# Patient Record
Sex: Female | Born: 1955
Health system: Southern US, Community
[De-identification: ages and names within clinical notes are randomized; demographics above are authoritative.]

## PROBLEM LIST (undated history)

## (undated) DIAGNOSIS — J45909 Unspecified asthma, uncomplicated: Secondary | ICD-10-CM

## (undated) DIAGNOSIS — F419 Anxiety disorder, unspecified: Secondary | ICD-10-CM

## (undated) DIAGNOSIS — F32A Depression, unspecified: Secondary | ICD-10-CM

## (undated) DIAGNOSIS — K219 Gastro-esophageal reflux disease without esophagitis: Secondary | ICD-10-CM

## (undated) DIAGNOSIS — F329 Major depressive disorder, single episode, unspecified: Secondary | ICD-10-CM

## (undated) DIAGNOSIS — E119 Type 2 diabetes mellitus without complications: Secondary | ICD-10-CM

## (undated) HISTORY — DX: Unspecified asthma, uncomplicated: J45.909

## (undated) HISTORY — DX: Major depressive disorder, single episode, unspecified: F32.9

## (undated) HISTORY — DX: Anxiety disorder, unspecified: F41.9

## (undated) HISTORY — PX: BILATERAL CARPAL TUNNEL RELEASE: SHX6508

## (undated) HISTORY — DX: Gastro-esophageal reflux disease without esophagitis: K21.9

## (undated) HISTORY — DX: Depression, unspecified: F32.A

## (undated) HISTORY — PX: ABDOMINAL HYSTERECTOMY: SHX81

## (undated) HISTORY — PX: OTHER SURGICAL HISTORY: SHX169

---

## 1997-04-23 ENCOUNTER — Ambulatory Visit (HOSPITAL_COMMUNITY): Admission: RE | Admit: 1997-04-23 | Discharge: 1997-04-23 | Payer: Self-pay | Admitting: Obstetrics & Gynecology

## 1998-04-26 ENCOUNTER — Encounter: Payer: Self-pay | Admitting: Obstetrics & Gynecology

## 1998-04-26 ENCOUNTER — Other Ambulatory Visit: Admission: RE | Admit: 1998-04-26 | Discharge: 1998-04-26 | Payer: Self-pay | Admitting: Obstetrics & Gynecology

## 1998-04-26 ENCOUNTER — Ambulatory Visit (HOSPITAL_COMMUNITY): Admission: RE | Admit: 1998-04-26 | Discharge: 1998-04-26 | Payer: Self-pay | Admitting: Obstetrics & Gynecology

## 1998-04-28 ENCOUNTER — Encounter: Payer: Self-pay | Admitting: Obstetrics & Gynecology

## 1998-04-28 ENCOUNTER — Ambulatory Visit (HOSPITAL_COMMUNITY): Admission: RE | Admit: 1998-04-28 | Discharge: 1998-04-28 | Payer: Self-pay | Admitting: Obstetrics & Gynecology

## 1998-10-31 ENCOUNTER — Ambulatory Visit (HOSPITAL_COMMUNITY): Admission: RE | Admit: 1998-10-31 | Discharge: 1998-10-31 | Payer: Self-pay | Admitting: Obstetrics & Gynecology

## 1998-10-31 ENCOUNTER — Encounter: Payer: Self-pay | Admitting: Obstetrics & Gynecology

## 1999-06-09 ENCOUNTER — Other Ambulatory Visit: Admission: RE | Admit: 1999-06-09 | Discharge: 1999-06-09 | Payer: Self-pay | Admitting: Obstetrics & Gynecology

## 1999-11-09 ENCOUNTER — Ambulatory Visit (HOSPITAL_COMMUNITY): Admission: RE | Admit: 1999-11-09 | Discharge: 1999-11-09 | Payer: Self-pay | Admitting: Obstetrics & Gynecology

## 1999-11-09 ENCOUNTER — Encounter: Payer: Self-pay | Admitting: Obstetrics & Gynecology

## 2000-09-17 ENCOUNTER — Encounter: Payer: Self-pay | Admitting: Obstetrics & Gynecology

## 2000-09-17 ENCOUNTER — Other Ambulatory Visit: Admission: RE | Admit: 2000-09-17 | Discharge: 2000-09-17 | Payer: Self-pay | Admitting: Obstetrics & Gynecology

## 2000-09-17 ENCOUNTER — Ambulatory Visit (HOSPITAL_COMMUNITY): Admission: RE | Admit: 2000-09-17 | Discharge: 2000-09-17 | Payer: Self-pay | Admitting: Obstetrics & Gynecology

## 2000-09-25 ENCOUNTER — Encounter: Admission: RE | Admit: 2000-09-25 | Discharge: 2000-09-25 | Payer: Self-pay | Admitting: Obstetrics & Gynecology

## 2000-09-25 ENCOUNTER — Encounter: Payer: Self-pay | Admitting: Obstetrics & Gynecology

## 2001-09-10 ENCOUNTER — Encounter: Payer: Self-pay | Admitting: Obstetrics & Gynecology

## 2001-09-10 ENCOUNTER — Ambulatory Visit (HOSPITAL_COMMUNITY): Admission: RE | Admit: 2001-09-10 | Discharge: 2001-09-10 | Payer: Self-pay | Admitting: Obstetrics & Gynecology

## 2001-09-10 ENCOUNTER — Other Ambulatory Visit: Admission: RE | Admit: 2001-09-10 | Discharge: 2001-09-10 | Payer: Self-pay | Admitting: Obstetrics & Gynecology

## 2001-11-06 ENCOUNTER — Observation Stay (HOSPITAL_COMMUNITY): Admission: RE | Admit: 2001-11-06 | Discharge: 2001-11-07 | Payer: Self-pay | Admitting: Obstetrics & Gynecology

## 2001-11-06 ENCOUNTER — Encounter (INDEPENDENT_AMBULATORY_CARE_PROVIDER_SITE_OTHER): Payer: Self-pay

## 2002-10-30 ENCOUNTER — Ambulatory Visit (HOSPITAL_COMMUNITY): Admission: RE | Admit: 2002-10-30 | Discharge: 2002-10-30 | Payer: Self-pay | Admitting: Obstetrics & Gynecology

## 2002-10-30 ENCOUNTER — Other Ambulatory Visit: Admission: RE | Admit: 2002-10-30 | Discharge: 2002-10-30 | Payer: Self-pay | Admitting: Obstetrics & Gynecology

## 2002-10-30 ENCOUNTER — Encounter: Payer: Self-pay | Admitting: Obstetrics & Gynecology

## 2003-12-17 ENCOUNTER — Other Ambulatory Visit: Admission: RE | Admit: 2003-12-17 | Discharge: 2003-12-17 | Payer: Self-pay | Admitting: Obstetrics & Gynecology

## 2003-12-17 ENCOUNTER — Ambulatory Visit (HOSPITAL_COMMUNITY): Admission: RE | Admit: 2003-12-17 | Discharge: 2003-12-17 | Payer: Self-pay | Admitting: Obstetrics & Gynecology

## 2005-01-02 ENCOUNTER — Ambulatory Visit (HOSPITAL_COMMUNITY): Admission: RE | Admit: 2005-01-02 | Discharge: 2005-01-02 | Payer: Self-pay | Admitting: Obstetrics & Gynecology

## 2005-01-02 ENCOUNTER — Other Ambulatory Visit: Admission: RE | Admit: 2005-01-02 | Discharge: 2005-01-02 | Payer: Self-pay | Admitting: Obstetrics & Gynecology

## 2006-01-04 ENCOUNTER — Ambulatory Visit (HOSPITAL_COMMUNITY): Admission: RE | Admit: 2006-01-04 | Discharge: 2006-01-04 | Payer: Self-pay | Admitting: Obstetrics & Gynecology

## 2007-01-08 ENCOUNTER — Ambulatory Visit (HOSPITAL_COMMUNITY): Admission: RE | Admit: 2007-01-08 | Discharge: 2007-01-08 | Payer: Self-pay | Admitting: Obstetrics & Gynecology

## 2007-12-26 ENCOUNTER — Encounter: Admission: RE | Admit: 2007-12-26 | Discharge: 2007-12-26 | Payer: Self-pay | Admitting: Neurology

## 2008-01-13 ENCOUNTER — Ambulatory Visit (HOSPITAL_COMMUNITY): Admission: RE | Admit: 2008-01-13 | Discharge: 2008-01-13 | Payer: Self-pay | Admitting: Obstetrics & Gynecology

## 2008-03-19 ENCOUNTER — Ambulatory Visit (HOSPITAL_BASED_OUTPATIENT_CLINIC_OR_DEPARTMENT_OTHER): Admission: RE | Admit: 2008-03-19 | Discharge: 2008-03-19 | Payer: Self-pay | Admitting: Orthopedic Surgery

## 2008-04-16 ENCOUNTER — Ambulatory Visit (HOSPITAL_BASED_OUTPATIENT_CLINIC_OR_DEPARTMENT_OTHER): Admission: RE | Admit: 2008-04-16 | Discharge: 2008-04-16 | Payer: Self-pay | Admitting: Orthopedic Surgery

## 2008-11-09 ENCOUNTER — Encounter: Admission: RE | Admit: 2008-11-09 | Discharge: 2009-02-07 | Payer: Self-pay | Admitting: Orthopedic Surgery

## 2009-01-14 ENCOUNTER — Ambulatory Visit (HOSPITAL_COMMUNITY): Admission: RE | Admit: 2009-01-14 | Discharge: 2009-01-14 | Payer: Self-pay | Admitting: Obstetrics & Gynecology

## 2009-07-08 ENCOUNTER — Ambulatory Visit (HOSPITAL_COMMUNITY): Admission: RE | Admit: 2009-07-08 | Discharge: 2009-07-08 | Payer: Self-pay | Admitting: Urology

## 2009-10-14 ENCOUNTER — Encounter: Payer: Self-pay | Admitting: Internal Medicine

## 2009-11-01 ENCOUNTER — Encounter: Payer: Self-pay | Admitting: Internal Medicine

## 2009-11-08 ENCOUNTER — Encounter: Payer: Self-pay | Admitting: Internal Medicine

## 2009-11-16 ENCOUNTER — Encounter (INDEPENDENT_AMBULATORY_CARE_PROVIDER_SITE_OTHER): Payer: Self-pay | Admitting: *Deleted

## 2009-12-01 ENCOUNTER — Encounter: Payer: Self-pay | Admitting: Internal Medicine

## 2009-12-14 ENCOUNTER — Encounter: Payer: Self-pay | Admitting: Internal Medicine

## 2009-12-23 ENCOUNTER — Encounter: Payer: Self-pay | Admitting: Internal Medicine

## 2009-12-29 ENCOUNTER — Ambulatory Visit: Payer: Self-pay | Admitting: Internal Medicine

## 2010-01-17 ENCOUNTER — Ambulatory Visit (HOSPITAL_COMMUNITY): Admission: RE | Admit: 2010-01-17 | Discharge: 2010-01-17 | Payer: Self-pay | Admitting: Obstetrics & Gynecology

## 2010-04-04 NOTE — Letter (Signed)
Summary: [facility]  [facility]   Imported By: Sherian Rein 01/14/2010 09:28:48  _____________________________________________________________________  External Attachment:    Type:   Image     Comment:   External Document

## 2010-04-04 NOTE — Letter (Signed)
Summary: [facility]  [facility]   Imported By: Sherian Rein 01/14/2010 09:26:36  _____________________________________________________________________  External Attachment:    Type:   Image     Comment:   External Document

## 2010-04-04 NOTE — Letter (Signed)
Summary: Patient's Hx/[facility]  Patient's Hx/[facility]   Imported By: Sherian Rein 01/14/2010 09:30:08  _____________________________________________________________________  External Attachment:    Type:   Image     Comment:   External Document

## 2010-04-04 NOTE — Letter (Signed)
Summary: [facility]  [facility]   Imported By: Sherian Rein 01/14/2010 09:27:53  _____________________________________________________________________  External Attachment:    Type:   Image     Comment:   External Document

## 2010-04-04 NOTE — Letter (Signed)
Summary: New Patient letter  [facility]  [address] [place], [place] [address]   Phone: [phone]  Fax: [phone]       11/16/2009 MRN: [id number]  Andrea Pittman [address] [place], [place]  [address]  Dear Ms. Andrea Pittman,  Welcome to the Gastroenterology Division at [facility].    You are scheduled to see Dr.  Leone Payor on 12-29-09 at 2:45p.m. on the 3rd floor at [facility], [address]  We ask that you try to arrive at our office 15 minutes prior to your appointment time to allow for check-in.  We would like you to complete the enclosed self-administered evaluation form prior to your visit and bring it with you on the day of your appointment.  We will review it with you.  Also, please bring a complete list of all your medications or, if you prefer, bring the medication bottles and we will list them.  Please bring your insurance card so that we may make a copy of it.  If your insurance requires a referral to see a specialist, please bring your referral form from your primary care physician.  Co-payments are due at the time of your visit and may be paid by cash, check or credit card.     Your office visit will consist of a consult with your physician (includes a physical exam), any laboratory testing he/she may order, scheduling of any necessary diagnostic testing (e.g. x-ray, ultrasound, CT-scan), and scheduling of a procedure (e.g. Endoscopy, Colonoscopy) if required.  Please allow enough time on your schedule to allow for any/all of these possibilities.    If you cannot keep your appointment, please call [phone] to cancel or reschedule prior to your appointment date.  This allows [place] the opportunity to schedule an appointment for another patient in need of care.  If you do not cancel or reschedule by 5 p.m. the business day prior to your appointment date, you will be charged a $50.00 late cancellation/no-show fee.    Thank you for choosing  [facility] for your medical needs.  We appreciate the opportunity to care for you.  Please visit [place] at our website  to learn more about our practice.                     Sincerely,                                                             The Gastroenterology Division

## 2010-04-04 NOTE — Assessment & Plan Note (Signed)
Summary: increased ferritin level--ch.   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 Neurologist had raised possibility of hemochromatosis. Ferrtin is 200. Iron sat 37%.   GI Review of Systems    Reports acid reflux and  heartburn.      Denies abdominal pain, belching, bloating, chest pain, dysphagia with liquids, dysphagia with solids, loss of appetite, nausea, vomiting, vomiting blood, weight loss, and  weight gain.        Denies anal fissure, black tarry stools, change in bowel habit, constipation, diarrhea, diverticulosis, fecal incontinence, heme positive stool, hemorrhoids, irritable bowel syndrome, jaundice, light color stool, liver problems, rectal bleeding, and  rectal pain.    Current Medications (verified): 1)  Gabapentin 300 Mg Caps (Gabapentin) .... 3 Tablets By Mouth in The Morning and Three Tablets By Mouth At Bedtime 2)  Estradiol 2 Mg Tabs (Estradiol) .... One Tablet By Mouth Once Daily 3)  Omeprazole 20 Mg Cpdr (Omeprazole) .... One Tablet By Mouth Once Daily 4)  Omega-3 Fish Oil 1200 Mg Caps (Omega-3 Fatty Acids) .... One Capsule By Mouth Once Daily 5)  Vitamin C 500 Mg  Tabs (Ascorbic Acid) .... One Tablet By Mouth Two Times A Day 6)  Vitamin D3 1000 Unit Caps (Cholecalciferol) .... One Capsule By Mouth Once Daily 7)  Vitamin B-12 1000 Mcg Tabs (Cyanocobalamin) .... One Tablet By Mouth Once Daily 8)  Msm 1000 Mg Caps (Methylsulfonylmethane) .... One Tablet By Mouth Once Daily 9)  Citrucel 500 Mg Tabs (Methylcellulose (Laxative)) .... One Tablet By Mouth Once Daily 10)  Multivitamins   Tabs (Multiple Vitamin) .... 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  Alcohol Use - no Daily Caffeine Use: 2 daily  Illicit Drug Use - no Smoking Status:  never Drug Use:  no  Vital Signs:  Patient profile:   55 year old female Height:      64 inches Weight:      171 pounds BMI:     29.46 BSA:     1.83 Pulse rate:   64 / minute Pulse rhythm:   regular BP sitting:   124 / 60  (left arm) Cuff size:   regular  Vitals Entered By: Ok Anis CMA (December 29, 2009 2:39 PM)  Physical Exam  General:  Well developed, well nourished, no acute distress.   Impression & Recommendations:  Problem # 1:  FERRITIN, ELEVATED (ICD-790.6) Assessment New This is not eleveated in the range to suggest hemochromatosis and needs no further evaluation. Likely acute phase reactant.  Problem # 2:  SCREENING, COLON CANCER (ICD-V76.51) Assessment: Comment Only colonoscopy ok 5 years ago will place a 5 year recall  Patient Instructions: 1)  There is no need for further testing due to the elevated ferritin. 2)  Keep follow-up with Dr. Christell Constant and Neurology. 3)  Please schedule a follow-up appointment as needed.  4)  Copy sent to : Rudi Heap, MD, Jake Samples, MD 5)  The medication list was reviewed and reconciled.  All changed / newly prescribed medications were explained.  A complete medication list was provided to the patient / caregiver.

## 2010-06-19 LAB — POCT HEMOGLOBIN-HEMACUE: Hemoglobin: 12.8 g/dL (ref 12.0–15.0)

## 2010-06-20 LAB — POCT HEMOGLOBIN-HEMACUE: Hemoglobin: 13.3 g/dL (ref 12.0–15.0)

## 2010-07-18 NOTE — Op Note (Signed)
NAME:  Andrea Pittman, Andrea Pittman                  ACCOUNT NO.:  [id number]   MEDICAL RECORD NO.:  [id number]          PATIENT TYPE:  AMB   LOCATION:  DSC                          FACILITY:  [facility]   PHYSICIAN:  Katy Fitch. Sypher, M.D. DATE OF BIRTH:  1955-10-30   DATE OF PROCEDURE:  03/19/2008  DATE OF DISCHARGE:                               OPERATIVE REPORT   PREOPERATIVE DIAGNOSIS:  Entrapment neuropathy, median nerve, left  carpal tunnel.   POSTOPERATIVE DIAGNOSIS:  Entrapment neuropathy, median nerve, left  carpal tunnel.   OPERATIONS:  Release of left transcarpal ligament.   OPERATIONS:  Katy Fitch. Sypher, MD   ASSISTANT:  Marveen Reeks Dasnoit, PA-C   ANESTHESIA:  General by LMA.   SUPERVISING ANESTHESIOLOGIST:  Kaylyn Layer. 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 Amelia Jo for evaluation and management of carpal  tunnel syndrome.   Dr. Clarisse Gouge had performed a detailed neurologic consultation and  identified bilateral carpal tunnel syndrome.   [name] is referred for an upper extremity orthopedic consult.  After  review of her medical history, physical examination, and  electrodiagnostic studies, we concluded that she had significant left  carpal tunnel syndrome.  We advised proceeding with release of her left  transcarpal ligament.   PROCEDURE:  [name] was brought to the operating room and placed in  the supine position upon the table.   Following the induction of general anesthesia by LMA technique, the left  arm was prepped with Betadine soap and solution and sterilely draped.  Following exsanguination of left arm with Esmarch bandage, the arterial  tourniquet was inflated to 230 mmHg.  Procedure commenced with a short  incision in the line of the ring finger and the palm.  Subcutaneous  tissues were carefully divided via the palmar fascia.  This was split  longitudinally to reveal the common sensory branch of the  median nerve.  These were followed back to the transverse carpal ligament which was  gently isolated from median nerve.  The ligament was then released along  its ulnar border extending into the distal forearm.  This widely opened  the carpal canal.  No mass or other predicaments were noted.   Bleeding points along the margin of the released ligament were  electrocauterized with bipolar current followed by repair of the skin  with intradermal 3-0 Prolene suture.   A compressive dressing was applied with a volar plaster splint  maintaining the wrist in 5 degrees of dorsiflexion.   There were no apparent complications.   For aftercare, [name] is provided a prescription for Vicodin 5 mg 1  p.o. q.4-6 hours p.r.n. pain, 20 tablets without refill.  She is  encouraged to use Motrin or Tylenol prior to using the Vicodin.      Katy Fitch Sypher, M.D.  Electronically Signed     RVS/MEDQ  D:  03/19/2008  T:  03/19/2008  Job:  [id number]   cc:   Rene Kocher, M.D.

## 2010-07-18 NOTE — Op Note (Signed)
NAME:  [name], [name]                  ACCOUNT NO.:  [id number]   MEDICAL RECORD NO.:  [id number]          PATIENT TYPE:  AMB   LOCATION:  DSC                          FACILITY:  [facility]   PHYSICIAN:  Katy Fitch. Sypher, M.D. DATE OF BIRTH:  23-Nov-1955   DATE OF PROCEDURE:  04/16/2008  DATE OF DISCHARGE:                               OPERATIVE REPORT   PREOPERATIVE DIAGNOSIS:  Right carpal tunnel syndrome.   POSTOPERATIVE DIAGNOSIS:  Right carpal tunnel syndrome.   OPERATIONS:  Release of right transverse carpal ligament.   SURGEON:  Katy Fitch. Sypher, MD   ASSISTANT:  Marveen Reeks Dasnoit, PA-C   ANESTHESIA:  General by LMA.   SUPERVISING ANESTHESIOLOGIST:  Janetta Hora. Gelene Mink, MD   INDICATIONS:  Andrea Pittman is a 55 year old woman who is referred for  evaluation and management by Dr. Amelia Jo for bilateral hand  numbness.  Clinical examination revealed signs of entrapment neuropathy  of the median nerve.  Electrodiagnostic studies completed by Dr. Clarisse Gouge  revealed significant carpal tunnel syndrome.   Due to a failed response in nonoperative measures, she is brought to the  operating room at this time for release of her right transverse carpal  ligament.   PROCEDURE:  Andrea Pittman is brought to the operating room and placed in a  supine position upon the operating table.   Following the induction of general anesthesia, the right arm was prepped  with Betadine soap and solution and sterilely draped.  A pneumatic  tourniquet was applied to the proximal right brachium.   Upon exsanguination of the right arm with an Esmarch bandage, the  arterial tourniquet was inflated to 230 mmHg.  The procedure commenced  with a short incision in the line of the ring finger and the palm.  The  subcutaneous tissues were carefully divided revealing the palmar fascia.  This was split longitudinally to reveal the common sensory branch of the  median nerve.  These were followed back to the transverse  carpal  ligament which was gently isolated from the median nerve.  The ligament  was then separated from the median nerve with a Insurance risk surveyor.  The ligament was then released subcutaneously extending into the distal  forearm.   This widely opened the carpal canal.  No mass or predicaments were  noted.  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  It is  likely that she will develop nodules of thickened palmar fascia in her  palms following the surgical incision.   Bleeding points were not problematic.  The wound was then repaired with  intradermal 3-0 Prolene suture.   A compressive dressing was applied to the volar plaster splint  maintaining the wrist in 5 degrees dorsiflexion.   For aftercare, [name] was provided prescription for Vicodin 5 mg one  p.o. q.4-6 h. p.r.n. pain.  She has medication at home.      Katy Fitch Sypher, M.D.  Electronically Signed     RVS/MEDQ  D:  04/16/2008  T:  04/16/2008  Job:  [id number]   cc:   Rene Kocher, M.D.

## 2010-07-21 NOTE — Discharge Summary (Signed)
   NAME:  Andrea Pittman, Andrea Pittman                            ACCOUNT NO.:  [id number]   MEDICAL RECORD NO.:  [id number]                   PATIENT TYPE:  OBV   LOCATION:  9304                                 FACILITY:  [facility]   PHYSICIAN:  Freddy Finner, M.D.                DATE OF BIRTH:  [birth year]   DATE OF ADMISSION:  11/06/2001  DATE OF DISCHARGE:  11/07/2001                                 DISCHARGE SUMMARY   DISCHARGE DIAGNOSES:  Uterine leiomyomata, extensive left adnexal adhesions,  hematosalpinx of left side.   OPERATIVE PROCEDURE:  Laparoscopically assisted vaginal hysterectomy, left  salpingo-oophorectomy, and lysis of pelvic adhesions.   POSTOPERATIVE COMPLICATIONS:  None.   DISPOSITION:  The patient is in satisfactory improved condition at the time  of her discharge.  She is to have progressively increasing physical  activity, but no vaginal entry.  She is to call for fever or heavy bleeding.  She is to take Percocet as needed for postoperative pain and/or ibuprofen.  She is to take a regular diet.  She is to see me in the office on the 24th  for first postoperative visit.  She is to take a regular diet.   Details of the history of present illness, past history, family history,  review of systems, physical examination recorded in the admission note.   Her physical findings were remarkable for a fibroid.  Her clinical history  was remarkable for menorrhagia unresponsive to oral contraceptives.   LABORATORIES:  Normal prothrombin time, PTT, and INR.  Her admission CBC was  normal.  Slight elevation of white count to 13.4.  Postoperative her  hemoglobin was 10.6.  Preoperative hemoglobin was 13.2.   HOSPITAL COURSE:  The patient was admitted on the morning of surgery.  She  was treated perioperatively  __________ with IV antibiotic.  The above  described operative procedure was accomplished without significant  difficulty.  The pathology report is pending at the time of her  discharge.  Laboratories in the midday on the first postoperative day.  The patient was  ambulating without difficulty, having adequate bowel and bladder function.  She was discharged home with disposition as noted above.  She remained  afebrile throughout her hospital stay.                                               Freddy Finner, M.D.    WRN/MEDQ  D:  11/07/2001  T:  11/07/2001  Job:  [id number]

## 2011-11-13 ENCOUNTER — Other Ambulatory Visit: Payer: Self-pay | Admitting: Dermatology

## 2012-05-23 ENCOUNTER — Other Ambulatory Visit: Payer: Self-pay | Admitting: Family Medicine

## 2012-06-13 ENCOUNTER — Encounter: Payer: Self-pay | Admitting: Family Medicine

## 2012-07-21 ENCOUNTER — Ambulatory Visit (INDEPENDENT_AMBULATORY_CARE_PROVIDER_SITE_OTHER): Payer: 59 | Admitting: Family Medicine

## 2012-07-21 ENCOUNTER — Encounter: Payer: Self-pay | Admitting: Family Medicine

## 2012-07-21 VITALS — BP 122/73 | HR 103 | Temp 97.9°F | Ht 64.0 in | Wt 177.6 lb

## 2012-07-21 DIAGNOSIS — J209 Acute bronchitis, unspecified: Secondary | ICD-10-CM

## 2012-07-21 DIAGNOSIS — R059 Cough, unspecified: Secondary | ICD-10-CM

## 2012-07-21 DIAGNOSIS — R509 Fever, unspecified: Secondary | ICD-10-CM

## 2012-07-21 DIAGNOSIS — J329 Chronic sinusitis, unspecified: Secondary | ICD-10-CM

## 2012-07-21 DIAGNOSIS — R05 Cough: Secondary | ICD-10-CM

## 2012-07-21 DIAGNOSIS — J029 Acute pharyngitis, unspecified: Secondary | ICD-10-CM

## 2012-07-21 LAB — POCT INFLUENZA A/B
Influenza A, POC: NEGATIVE
Influenza B, POC: NEGATIVE

## 2012-07-21 LAB — POCT RAPID STREP A (OFFICE): Rapid Strep A Screen: NEGATIVE

## 2012-07-21 MED ORDER — AMOXICILLIN-POT CLAVULANATE 875-125 MG PO TABS: 1.0000 | ORAL_TABLET | Freq: Two times a day (BID) | ORAL | Status: DC

## 2012-07-21 MED ORDER — HYDROCOD POLST-CHLORPHEN POLST 10-8 MG/5ML PO LQCR: 5.0000 mL | Freq: Every evening | ORAL | Status: DC | PRN

## 2012-07-21 NOTE — Patient Instructions (Addendum)
Drink plenty of fluids. Take Tylenol or Advil as needed for fever Take cough medicine as directed. Take antibiotic as directed

## 2012-07-21 NOTE — Progress Notes (Signed)
  Subjective:    Patient ID: Andrea Pittman, female    DOB: 24-Dec-1955, 57 y.o.   MRN: [id number]  HPI See review of systems. This started 2 days ago.   Review of Systems  Constitutional: Positive for fever (last PM) and chills.  HENT: Positive for congestion, sore throat and tinnitus.   Respiratory: Positive for cough (slightly prod).   Neurological: Positive for headaches.       Objective:   Physical Exam  Nursing note and vitals reviewed. Constitutional: She is oriented to person, place, and time. She appears well-developed and well-nourished. No distress.  HENT:  Head: Normocephalic and atraumatic.  Right Ear: External ear normal.  Left Ear: External ear normal.  Mouth/Throat: Oropharynx is clear and moist.  Nasal congestion bilaterally. Maxillary and ethmoid sinus tenderness  Eyes: Conjunctivae are normal. Right eye exhibits no discharge. Left eye exhibits no discharge. No scleral icterus.  Neck: Normal range of motion. Neck supple. No thyromegaly present.  Cardiovascular: Normal rate, regular rhythm and normal heart sounds.  Exam reveals no friction rub.   No murmur heard. Pulmonary/Chest: Effort normal and breath sounds normal. No respiratory distress. She has no wheezes. She has no rales.  Minimal congestion just with coughing otherwise breath sounds were clear  Lymphadenopathy:    She has no cervical adenopathy.  Neurological: She is alert and oriented to person, place, and time.  Skin: Skin is warm and dry. She is not diaphoretic.  Psychiatric: She has a normal mood and affect. Her behavior is normal. Judgment and thought content normal.     Results for orders placed in visit on 07/21/12  POCT RAPID STREP A (OFFICE)      Result Value Range   Rapid Strep A Screen Negative  Negative          Assessment & Plan:  1. Sore throat - POCT rapid strep A - POCT Influenza A/B - Culture, Group A Strep - amoxicillin-clavulanate (AUGMENTIN) 875-125 MG per tablet; Take 1  tablet by mouth 2 (two) times daily.  Dispense: 20 tablet; Refill: 0  2. Cough - POCT Influenza A/B - Culture, Group A Strep - chlorpheniramine-HYDROcodone (TUSSIONEX PENNKINETIC ER) 10-8 MG/5ML LQCR; Take 5 mLs by mouth at bedtime as needed.  Dispense: 120 mL; Refill: 0  3. Fever - POCT Influenza A/B - Culture, Group A Strep - amoxicillin-clavulanate (AUGMENTIN) 875-125 MG per tablet; Take 1 tablet by mouth 2 (two) times daily.  Dispense: 20 tablet; Refill: 0  4. Sinusitis - amoxicillin-clavulanate (AUGMENTIN) 875-125 MG per tablet; Take 1 tablet by mouth 2 (two) times daily.  Dispense: 20 tablet; Refill: 0  5. Acute bronchitis - amoxicillin-clavulanate (AUGMENTIN) 875-125 MG per tablet; Take 1 tablet by mouth 2 (two) times daily.  Dispense: 20 tablet; Refill: 0   Patient Instructions  Drink plenty of fluids. Take Tylenol or Advil as needed for fever Take cough medicine as directed. Take antibiotic as directed

## 2012-07-23 LAB — CULTURE, GROUP A STREP: Organism ID, Bacteria: NORMAL

## 2012-07-24 ENCOUNTER — Telehealth: Payer: Self-pay | Admitting: *Deleted

## 2012-07-24 NOTE — Telephone Encounter (Signed)
Pt notifed of result She is feeling better

## 2012-07-24 NOTE — Telephone Encounter (Signed)
Message copied by Bearl Mulberry on Thu Jul 24, 2012  9:24 AM ------      Message from: Ernestina Penna      Created: Wed Jul 23, 2012  1:35 PM       Strep culture was negative      Hopefully she is feeling better ------

## 2012-08-25 ENCOUNTER — Ambulatory Visit: Payer: Self-pay | Admitting: Family Medicine

## 2012-12-26 ENCOUNTER — Other Ambulatory Visit: Payer: Self-pay | Admitting: *Deleted

## 2012-12-26 MED ORDER — OMEPRAZOLE 40 MG PO CPDR: 40.0000 mg | DELAYED_RELEASE_CAPSULE | Freq: Every day | ORAL | Status: DC

## 2013-03-12 ENCOUNTER — Other Ambulatory Visit: Payer: Self-pay

## 2013-03-12 MED ORDER — OMEPRAZOLE 40 MG PO CPDR: 40.0000 mg | DELAYED_RELEASE_CAPSULE | Freq: Every day | ORAL | Status: DC

## 2013-03-12 NOTE — Telephone Encounter (Signed)
Last seen 07/21/12  DWM

## 2013-03-30 ENCOUNTER — Ambulatory Visit (INDEPENDENT_AMBULATORY_CARE_PROVIDER_SITE_OTHER): Payer: 59 | Admitting: Family Medicine

## 2013-03-30 ENCOUNTER — Encounter: Payer: Self-pay | Admitting: Family Medicine

## 2013-03-30 VITALS — BP 120/73 | HR 104 | Temp 99.0°F | Ht 64.0 in | Wt 176.6 lb

## 2013-03-30 DIAGNOSIS — J111 Influenza due to unidentified influenza virus with other respiratory manifestations: Secondary | ICD-10-CM

## 2013-03-30 DIAGNOSIS — J209 Acute bronchitis, unspecified: Secondary | ICD-10-CM | POA: Insufficient documentation

## 2013-03-30 DIAGNOSIS — J029 Acute pharyngitis, unspecified: Secondary | ICD-10-CM | POA: Insufficient documentation

## 2013-03-30 LAB — POCT INFLUENZA A/B
Influenza A, POC: NEGATIVE
Influenza B, POC: NEGATIVE

## 2013-03-30 LAB — POCT RAPID STREP A (OFFICE): Rapid Strep A Screen: NEGATIVE

## 2013-03-30 MED ORDER — OSELTAMIVIR PHOSPHATE 75 MG PO CAPS: 75.0000 mg | ORAL_CAPSULE | Freq: Two times a day (BID) | ORAL | Status: DC

## 2013-03-30 MED ORDER — AZITHROMYCIN 250 MG PO TABS: ORAL_TABLET | ORAL | Status: DC

## 2013-03-30 NOTE — Progress Notes (Signed)
Patient ID: Andrea Pittman, female   DOB: 1956/02/02, 58 y.o.   MRN: [id number] SUBJECTIVE: CC: Chief Complaint  Patient presents with  . Acute Visit    sore throat cough    HPI: Sore Throat Patient complains of severe sore throat. Associated symptoms include chest pain during cough, chills, coryza, headache, hoarseness, nasal blockage, night sweats, pain while swallowing, post nasal drip, sinus and nasal congestion and sore throat. Onset of symptoms was 3 days ago, and have been gradually worsening since that time. She is drinking plenty of fluids. She has not had recent close exposure to someone with proven streptococcal pharyngitis. Exposed to friends with the flu. Feels like its in her chest as well.  No past medical history on file. No past surgical history on file. History   Social History  . Marital Status: Married    Spouse Name: N/A    Number of Children: N/A  . Years of Education: N/A   Occupational History  . Not on file.   Social History Main Topics  . Smoking status: Never Smoker   . Smokeless tobacco: Not on file  . Alcohol Use: No  . Drug Use: No  . Sexual Activity: Not on file   Other Topics Concern  . Not on file   Social History Narrative  . No narrative on file   No family history on file. Current Outpatient Prescriptions on File Prior to Visit  Medication Sig Dispense Refill  . cetirizine (ZYRTEC) 10 MG tablet Take 10 mg by mouth daily.      . chlorpheniramine-HYDROcodone (TUSSIONEX PENNKINETIC ER) 10-8 MG/5ML LQCR Take 5 mLs by mouth at bedtime as needed.  120 mL  0  . cholecalciferol (VITAMIN D) 1000 UNITS tablet Take 2,000 Units by mouth daily.      . DULoxetine (CYMBALTA) 60 MG capsule Take 60 mg by mouth daily.      Marland Kitchen estradiol (ESTRACE) 2 MG tablet Take 2 mg by mouth daily.      Marland Kitchen omeprazole (PRILOSEC) 40 MG capsule Take 1 capsule (40 mg total) by mouth daily.  30 capsule  0  . carbamazepine (TEGRETOL) 200 MG tablet Take 400 mg by mouth at  bedtime.       No current facility-administered medications on file prior to visit.   No Known Allergies  There is no immunization history on file for this patient. Prior to Admission medications   Medication Sig Start Date End Date Taking? Authorizing Provider  cetirizine (ZYRTEC) 10 MG tablet Take 10 mg by mouth daily.   Yes Historical Provider, MD  chlorpheniramine-HYDROcodone (TUSSIONEX PENNKINETIC ER) 10-8 MG/5ML LQCR Take 5 mLs by mouth at bedtime as needed. 07/21/12  Yes Chipper Herb, MD  cholecalciferol (VITAMIN D) 1000 UNITS tablet Take 2,000 Units by mouth daily.   Yes Historical Provider, MD  DULoxetine (CYMBALTA) 60 MG capsule Take 60 mg by mouth daily.   Yes Historical Provider, MD  estradiol (ESTRACE) 2 MG tablet Take 2 mg by mouth daily.   Yes Historical Provider, MD  omeprazole (PRILOSEC) 40 MG capsule Take 1 capsule (40 mg total) by mouth daily. 03/12/13  Yes Chipper Herb, MD  amoxicillin-clavulanate (AUGMENTIN) 875-125 MG per tablet Take 1 tablet by mouth 2 (two) times daily. 07/21/12   Chipper Herb, MD  carbamazepine (TEGRETOL) 200 MG tablet Take 400 mg by mouth at bedtime.    Historical Provider, MD     ROS: As above in the HPI. All other systems are  stable or negative.  OBJECTIVE: APPEARANCE:  Patient in no acute distress.The patient appeared well nourished and normally developed. Acyanotic. Waist: VITAL SIGNS:BP 120/73  Pulse 104  Temp(Src) 99 F (37.2 C) (Oral)  Ht 5\' 4"  (1.626 m)  Wt 176 lb 9.6 oz (80.105 kg)  BMI 30.30 kg/m2 WF Warm and sweaty  SKIN: warm and  Dry without overt rashes, tattoos and scars  HEAD and Neck: without JVD, Head and scalp: normal Eyes:No scleral icterus. Fundi normal, eye movements normal. Ears: Auricle normal, canal normal, Tympanic membranes normal, insufflation normal. Nose: rhinorrhea Throat: normal Neck & thyroid: normal  CHEST & LUNGS: Chest wall: normal Lungs: Coarse bs, rattling cough  CVS: Reveals the PMI  to be normally located. Regular rhythm, First and Second Heart sounds are normal,  absence of murmurs, rubs or gallops. Peripheral vasculature: Radial pulses: normal Dorsal pedis pulses: normal Posterior pulses: normal  ABDOMEN:  Appearance: normal Benign, no organomegaly, no masses, no Abdominal Aortic enlargement. No Guarding , no rebound. No Bruits. Bowel sounds: normal  RECTAL: N/A GU: N/A  EXTREMETIES: nonedematous.  MUSCULOSKELETAL:  Spine: normal Joints: intact  NEUROLOGIC: oriented to time,place and person; nonfocal. Strength is normal Sensory is normal Reflexes are normal Cranial Nerves are normal.  ASSESSMENT: Sore throat - Plan: POCT Influenza A/B, POCT rapid strep A  Acute bronchitis  Influenza with other respiratory manifestations Flu exposure.  PLAN: Handouts in the AVS on : influenza, bronchitis. Contagiousness and  Handwashing discussed.  Orders Placed This Encounter  Procedures  . POCT Influenza A/B  . POCT rapid strep A   Results for orders placed in visit on 07/21/12  CULTURE, GROUP A STREP      Result Value Range   Organism ID, Bacteria Normal Upper Respiratory Flora     Organism ID, Bacteria No Beta Hemolytic Streptococci Isolated    POCT RAPID STREP A (OFFICE)      Result Value Range   Rapid Strep A Screen Negative  Negative  POCT INFLUENZA A/B      Result Value Range   Influenza A, POC Negative     Influenza B, POC Negative      Meds ordered this encounter  Medications  . azithromycin (ZITHROMAX Z-PAK) 250 MG tablet    Sig: 2 tabs on day 1 and 1 tab on days 2 to 5.    Dispense:  6 each    Refill:  0  . oseltamivir (TAMIFLU) 75 MG capsule    Sig: Take 1 capsule (75 mg total) by mouth 2 (two) times daily.    Dispense:  10 capsule    Refill:  0   Medications Discontinued During This Encounter  Medication Reason  . amoxicillin-clavulanate (AUGMENTIN) 875-125 MG per tablet Completed Course   Return if symptoms worsen or  fail to improve.  Angla Delahunt P. Jacelyn Grip, M.D.

## 2013-03-30 NOTE — Patient Instructions (Signed)
Influenza A (H1N1) H1N1 formerly called "swine flu" is a new influenza virus causing sickness in people. The H1N1 virus is different from seasonal influenza viruses. However, the H1N1 symptoms are similar to seasonal influenza and it is spread from person to person. You may be at higher risk for serious problems if you have underlying serious medical conditions. The CDC and the World Health Organization are following reported cases around the world. CAUSES   The flu is thought to spread mainly person-to-person through coughing or sneezing of infected people.  A person may become infected by touching something with the virus on it and then touching their mouth or nose. SYMPTOMS   Fever.  Headache.  Tiredness.  Cough.  Sore throat.  Runny or stuffy nose.  Body aches.  Diarrhea and vomiting These symptoms are referred to as "flu-like symptoms." A lot of different illnesses, including the common cold, may have similar symptoms. DIAGNOSIS   There are tests that can tell if you have the H1N1 virus.  Confirmed cases of H1N1 will be reported to the state or local health department.  A doctor's exam may be needed to tell whether you have an infection that is a complication of the flu. HOME CARE INSTRUCTIONS   Stay informed. Visit the CDC website for current recommendations. Visit www.cdc.gov/H1N1flu/. You may also call 1-800-CDC-INFO (1-800-232-4636).  Get help early if you develop any of the above symptoms.  If you are at high risk from complications of the flu, talk to your caregiver as soon as you develop flu-like symptoms. Those at higher risk for complications include:  People 65 years or older.  People with chronic medical conditions.  Pregnant women.  Young children.  Your caregiver may recommend antiviral medicine to help treat the flu.  If you get the flu, get plenty of rest, drink enough water and fluids to keep your urine clear or pale yellow, and avoid using  alcohol or tobacco.  You may take over-the-counter medicine to relieve the symptoms of the flu if your caregiver approves. (Never give aspirin to children or teenagers who have flu-like symptoms, particularly fever). TREATMENT  If you do get sick, antiviral drugs are available. These drugs can make your illness milder and make you feel better faster. Treatment should start soon after illness starts. It is only effective if taken within the first day of becoming ill. Only your caregiver can prescribe antiviral medication.  PREVENTION   Cover your nose and mouth with a tissue or your arm when you cough or sneeze. Throw the tissue away.  Wash your hands often with soap and warm water, especially after you cough or sneeze. Alcohol-based cleaners are also effective against germs.  Avoid touching your eyes, nose or mouth. This is one way germs spread.  Try to avoid contact with sick people. Follow public health advice regarding school closures. Avoid crowds.  Stay home if you get sick. Limit contact with others to keep from infecting them. People infected with the H1N1 virus may be able to infect others anywhere from 1 day before feeling sick to 5-7 days after getting flu symptoms.  An H1N1 vaccine is available to help protect against the virus. In addition to the H1N1 vaccine, you will need to be vaccinated for seasonal influenza. The H1N1 and seasonal vaccines may be given on the same day. The CDC especially recommends the H1N1 vaccine for:  Pregnant women.  People who live with or care for children younger than 6 months of age.    Health care and emergency services personnel.  Persons between the ages of 46 months through 5 years of age.  People from ages 7 through 54 years who are at higher risk for H1N1 because of chronic health disorders or immune system problems. FACEMASKS In community and home settings, the use of facemasks and N95 respirators are not normally recommended. In certain  circumstances, a facemask or N95 respirator may be used for persons at increased risk of severe illness from influenza. Your caregiver can give additional recommendations for facemask use. IN CHILDREN, EMERGENCY WARNING SIGNS THAT NEED URGENT MEDICAL CARE:  Fast breathing or trouble breathing.  Bluish skin color.  Not drinking enough fluids.  Not waking up or not interacting normally.  Being so fussy that the child does not want to be held.  Your child has an oral temperature above 102 F (38.9 C), not controlled by medicine.  Your baby is older than 3 months with a rectal temperature of 102 F (38.9 C) or higher.  Your baby is 42 months old or younger with a rectal temperature of 100.4 F (38 C) or higher.  Flu-like symptoms improve but then return with fever and worse cough. IN ADULTS, EMERGENCY WARNING SIGNS THAT NEED URGENT MEDICAL CARE:  Difficulty breathing or shortness of breath.  Pain or pressure in the chest or abdomen.  Sudden dizziness.  Confusion.  Severe or persistent vomiting.  Bluish color.  You have a oral temperature above 102 F (38.9 C), not controlled by medicine.  Flu-like symptoms improve but return with fever and worse cough. SEEK IMMEDIATE MEDICAL CARE IF:  You or someone you know is experiencing any of the above symptoms. When you arrive at the emergency center, report that you think you have the flu. You may be asked to wear a mask and/or sit in a secluded area to protect others from getting sick. MAKE SURE YOU:   Understand these instructions.  Will watch your condition.  Will get help right away if you are not doing well or get worse. Some of this information courtesy of the CDC.  Document Released: 08/08/2007 Document Revised: 05/14/2011 Document Reviewed: 08/08/2007 [facility] Patient Information 2014 [place], [place].   Bronchitis Bronchitis is inflammation of the airways that extend from the windpipe into the lungs (bronchi). The  inflammation often causes mucus to develop, which leads to a cough. If the inflammation becomes severe, it may cause shortness of breath. CAUSES  Bronchitis may be caused by:   Viral infections.   Bacteria.   Cigarette smoke.   Allergens, pollutants, and other irritants.  SIGNS AND SYMPTOMS  The most common symptom of bronchitis is a frequent cough that produces mucus. Other symptoms include:  Fever.   Body aches.   Chest congestion.   Chills.   Shortness of breath.   Sore throat.  DIAGNOSIS  Bronchitis is usually diagnosed through a medical history and physical exam. Tests, such as chest X-rays, are sometimes done to rule out other conditions.  TREATMENT  You may need to avoid contact with whatever caused the problem (smoking, for example). Medicines are sometimes needed. These may include:  Antibiotics. These may be prescribed if the condition is caused by bacteria.  Cough suppressants. These may be prescribed for relief of cough symptoms.   Inhaled medicines. These may be prescribed to help open your airways and make it easier for you to breathe.   Steroid medicines. These may be prescribed for those with recurrent (chronic) bronchitis. HOME CARE INSTRUCTIONS  Get  plenty of rest.   Drink enough fluids to keep your urine clear or pale yellow (unless you have a medical condition that requires fluid restriction). Increasing fluids may help thin your secretions and will prevent dehydration.   Only take over-the-counter or prescription medicines as directed by your health care provider.  Only take antibiotics as directed. Make sure you finish them even if you start to feel better.  Avoid secondhand smoke, irritating chemicals, and strong fumes. These will make bronchitis worse. If you are a smoker, quit smoking. Consider using nicotine gum or skin patches to help control withdrawal symptoms. Quitting smoking will help your lungs heal faster.   Put a  cool-mist humidifier in your bedroom at night to moisten the air. This may help loosen mucus. Change the water in the humidifier daily. You can also run the hot water in your shower and sit in the bathroom with the door closed for 5 10 minutes.   Follow up with your health care provider as directed.   Wash your hands frequently to avoid catching bronchitis again or spreading an infection to others.  SEEK MEDICAL CARE IF: Your symptoms do not improve after 1 week of treatment.  SEEK IMMEDIATE MEDICAL CARE IF:  Your fever increases.  You have chills.   You have chest pain.   You have worsening shortness of breath.   You have bloody sputum.  You faint.  You have lightheadedness.  You have a severe headache.   You vomit repeatedly. MAKE SURE YOU:   Understand these instructions.  Will watch your condition.  Will get help right away if you are not doing well or get worse. Document Released: 02/19/2005 Document Revised: 12/10/2012 Document Reviewed: 10/14/2012 [employer] Patient Information 2014 [employer].      

## 2013-05-02 ENCOUNTER — Other Ambulatory Visit: Payer: Self-pay | Admitting: Family Medicine

## 2013-05-02 MED ORDER — OSELTAMIVIR PHOSPHATE 75 MG PO CAPS: 75.0000 mg | ORAL_CAPSULE | Freq: Two times a day (BID) | ORAL | Status: DC

## 2013-06-02 ENCOUNTER — Other Ambulatory Visit: Payer: Self-pay | Admitting: Family Medicine

## 2014-01-01 ENCOUNTER — Other Ambulatory Visit: Payer: Self-pay | Admitting: Nurse Practitioner

## 2014-04-23 ENCOUNTER — Other Ambulatory Visit: Payer: Self-pay | Admitting: Nurse Practitioner

## 2014-06-03 ENCOUNTER — Other Ambulatory Visit: Payer: Self-pay | Admitting: Obstetrics & Gynecology

## 2014-06-04 LAB — CYTOLOGY - PAP

## 2014-06-07 LAB — HM MAMMOGRAPHY: HM MAMMO: NEGATIVE

## 2014-07-23 ENCOUNTER — Other Ambulatory Visit: Payer: Self-pay | Admitting: Nurse Practitioner

## 2014-09-29 ENCOUNTER — Other Ambulatory Visit: Payer: Self-pay | Admitting: Nurse Practitioner

## 2014-12-10 ENCOUNTER — Telehealth: Payer: Self-pay | Admitting: Family Medicine

## 2014-12-14 NOTE — Telephone Encounter (Signed)
Called and advised [facility] that we received the records request and [facility] would be here 12-16-14 to do them.

## 2015-01-11 ENCOUNTER — Other Ambulatory Visit: Payer: Self-pay | Admitting: Nurse Practitioner

## 2015-02-10 ENCOUNTER — Ambulatory Visit (INDEPENDENT_AMBULATORY_CARE_PROVIDER_SITE_OTHER): Payer: Self-pay | Admitting: Obstetrics and Gynecology

## 2015-02-10 ENCOUNTER — Other Ambulatory Visit: Payer: Self-pay | Admitting: Nurse Practitioner

## 2015-02-11 ENCOUNTER — Other Ambulatory Visit: Payer: Self-pay | Admitting: Family Medicine

## 2015-02-11 MED ORDER — OMEPRAZOLE 40 MG PO CPDR: DELAYED_RELEASE_CAPSULE | ORAL | Status: DC

## 2015-02-11 NOTE — Telephone Encounter (Signed)
done

## 2015-02-14 ENCOUNTER — Ambulatory Visit: Payer: Self-pay | Admitting: Family Medicine

## 2015-02-15 ENCOUNTER — Encounter: Payer: Self-pay | Admitting: Family Medicine

## 2015-02-15 ENCOUNTER — Ambulatory Visit (INDEPENDENT_AMBULATORY_CARE_PROVIDER_SITE_OTHER): Payer: 59 | Admitting: Family Medicine

## 2015-02-15 VITALS — BP 123/74 | HR 103 | Temp 97.2°F | Ht 64.0 in | Wt 184.8 lb

## 2015-02-15 DIAGNOSIS — Z13220 Encounter for screening for lipoid disorders: Secondary | ICD-10-CM | POA: Diagnosis not present

## 2015-02-15 DIAGNOSIS — G609 Hereditary and idiopathic neuropathy, unspecified: Secondary | ICD-10-CM | POA: Insufficient documentation

## 2015-02-15 DIAGNOSIS — K219 Gastro-esophageal reflux disease without esophagitis: Secondary | ICD-10-CM | POA: Insufficient documentation

## 2015-02-15 DIAGNOSIS — Z131 Encounter for screening for diabetes mellitus: Secondary | ICD-10-CM

## 2015-02-15 MED ORDER — OMEPRAZOLE 40 MG PO CPDR: DELAYED_RELEASE_CAPSULE | ORAL | Status: DC

## 2015-02-15 NOTE — Progress Notes (Signed)
BP 123/74 mmHg  Pulse 103  Temp(Src) 97.2 F (36.2 C) (Oral)  Ht '5\' 4"'  (1.626 m)  Wt 184 lb 12.8 oz (83.825 kg)  BMI 31.71 kg/m2   Subjective:    Patient ID: Andrea Pittman, female    DOB: [birth year], 59 y.o.   MRN: [id number]  HPI: Andrea Pittman is a 59 y.o. female presenting on 02/15/2015 for Medication Refill   HPI GERD recheck Patient is coming in today because she needed a recheck for her acid reflux medication. She is currently on omeprazole 40 mg daily prior to breakfast. She says that it is controlling her heartburn very well and she is not having any issues with it. She denies any history of ulcers or bleeding or blood in her stool or dark tarry stools. She normally sees gynecology and neurology for other issues.  Relevant past medical, surgical, family and social history reviewed and updated as indicated. Interim medical history since our last visit reviewed. Allergies and medications reviewed and updated.  Review of Systems  Constitutional: Negative for fever and chills.  HENT: Negative for congestion, ear discharge and ear pain.   Eyes: Negative for redness and visual disturbance.  Respiratory: Negative for chest tightness and shortness of breath.   Cardiovascular: Negative for chest pain and leg swelling.  Gastrointestinal: Negative for abdominal pain, blood in stool, abdominal distention and anal bleeding.  Genitourinary: Negative for dysuria and difficulty urinating.  Musculoskeletal: Negative for back pain and gait problem.  Skin: Negative for rash.  Neurological: Negative for light-headedness and headaches.  Psychiatric/Behavioral: Negative for behavioral problems and agitation.  All other systems reviewed and are negative.   Per HPI unless specifically indicated above     Medication List       This list is accurate as of: 02/15/15  2:13 PM.  Always use your most recent med list.               carbamazepine 200 MG tablet  Commonly known as:   TEGRETOL  Take 400 mg by mouth at bedtime.     cetirizine 10 MG tablet  Commonly known as:  ZYRTEC  Take 10 mg by mouth daily.     cholecalciferol 1000 UNITS tablet  Commonly known as:  VITAMIN D  Take 2,000 Units by mouth daily.     DULoxetine 60 MG capsule  Commonly known as:  CYMBALTA  Take 60 mg by mouth daily.     estradiol 2 MG tablet  Commonly known as:  ESTRACE  Take 2 mg by mouth daily.     omeprazole 40 MG capsule  Commonly known as:  PRILOSEC  TAKE ONE (1) CAPSULE EACH DAY           Objective:    BP 123/74 mmHg  Pulse 103  Temp(Src) 97.2 F (36.2 C) (Oral)  Ht '5\' 4"'  (1.626 m)  Wt 184 lb 12.8 oz (83.825 kg)  BMI 31.71 kg/m2  Wt Readings from Last 3 Encounters:  02/15/15 184 lb 12.8 oz (83.825 kg)  03/30/13 176 lb 9.6 oz (80.105 kg)  07/21/12 177 lb 9.6 oz (80.559 kg)    Physical Exam  Constitutional: She is oriented to person, place, and time. She appears well-developed and well-nourished. No distress.  Eyes: Conjunctivae and EOM are normal. Pupils are equal, round, and reactive to light.  Neck: Neck supple. No thyromegaly present.  Cardiovascular: Normal rate, regular rhythm, normal heart sounds and intact distal pulses.   No murmur heard. Pulmonary/Chest:  Effort normal and breath sounds normal. No respiratory distress. She has no wheezes.  Abdominal: Soft. Bowel sounds are normal. She exhibits no distension. There is no tenderness. There is no rebound.  Musculoskeletal: Normal range of motion. She exhibits no edema or tenderness.  Lymphadenopathy:    She has no cervical adenopathy.  Neurological: She is alert and oriented to person, place, and time. Coordination normal.  Skin: Skin is warm and dry. No rash noted. She is not diaphoretic.  Psychiatric: She has a normal mood and affect. Her behavior is normal.  Nursing note and vitals reviewed.   Results for orders placed or performed in visit on 06/03/14  Cytology - PAP  Result Value Ref Range    CYTOLOGY - PAP PAP RESULT       Assessment & Plan:   Problem List Items Addressed This Visit      Digestive   GERD (gastroesophageal reflux disease) - Primary   Relevant Medications   omeprazole (PRILOSEC) 40 MG capsule   Other Relevant Orders   CBC with Differential/Platelet    Other Visit Diagnoses    Encounter for screening for lipid disorder        Relevant Orders    Lipid panel    Diabetes mellitus screening        Relevant Orders    CMP14+EGFR    TSH        Follow up plan: Return in about 6 months (around 08/16/2015), or if symptoms worsen or fail to improve, for GERD f/u .  Counseling provided for all of the vaccine components Orders Placed This Encounter  Procedures  . Lipid panel  . CMP14+EGFR  . TSH  . CBC with Differential/Platelet    Caryl Pina, MD [facility] 02/15/2015, 2:13 PM

## 2015-04-01 ENCOUNTER — Other Ambulatory Visit: Payer: Self-pay | Admitting: Family Medicine

## 2015-04-22 ENCOUNTER — Encounter: Payer: Self-pay | Admitting: *Deleted

## 2015-05-30 ENCOUNTER — Ambulatory Visit (INDEPENDENT_AMBULATORY_CARE_PROVIDER_SITE_OTHER): Payer: 59 | Admitting: Family Medicine

## 2015-05-30 ENCOUNTER — Ambulatory Visit (INDEPENDENT_AMBULATORY_CARE_PROVIDER_SITE_OTHER): Payer: 59

## 2015-05-30 ENCOUNTER — Encounter: Payer: Self-pay | Admitting: Family Medicine

## 2015-05-30 VITALS — BP 114/79 | HR 89 | Temp 97.4°F | Ht 64.0 in | Wt 183.8 lb

## 2015-05-30 DIAGNOSIS — M25562 Pain in left knee: Secondary | ICD-10-CM

## 2015-05-30 MED ORDER — OMEPRAZOLE 40 MG PO CPDR: 40.0000 mg | DELAYED_RELEASE_CAPSULE | Freq: Every day | ORAL | Status: DC

## 2015-05-30 NOTE — Progress Notes (Signed)
BP 114/79 mmHg  Pulse 89  Temp(Src) 97.4 F (36.3 C) (Oral)  Ht 5\' 4"  (1.626 m)  Wt 183 lb 12.8 oz (83.371 kg)  BMI 31.53 kg/m2   Subjective:    Patient ID: Andrea Pittman, female    DOB: December 30, 1955, 60 y.o.   MRN: [id number]  HPI: Andrea Pittman is a 60 y.o. female presenting on 05/30/2015 for Knee Pain   HPI Left lateral knee pain Patient is coming in for left lateral knee pain after she fell in the shower last night. She was trying to step out of the shower and as she fell her leg slipped backwards and came down on that left knee. Most of her pain is on the lateral aspect of the knee along with swelling and effusion throughout the knee. She feels very unstable on the knee and is having to use a cane that she doesn't normally have to use. She denies any fevers or chills or redness or warmth. The pain is an 8 out of 10. The pain does not radiate anywhere else up into her hip or ankle.  Relevant past medical, surgical, family and social history reviewed and updated as indicated. Interim medical history since our last visit reviewed. Allergies and medications reviewed and updated.  Review of Systems  Constitutional: Negative for fever and chills.  HENT: Negative for congestion, ear discharge and ear pain.   Eyes: Negative for redness and visual disturbance.  Respiratory: Negative for chest tightness and shortness of breath.   Cardiovascular: Negative for chest pain and leg swelling.  Genitourinary: Negative for dysuria and difficulty urinating.  Musculoskeletal: Positive for joint swelling and arthralgias. Negative for back pain and gait problem.  Skin: Negative for color change and rash.  Neurological: Negative for light-headedness and headaches.  Psychiatric/Behavioral: Negative for behavioral problems and agitation.  All other systems reviewed and are negative.   Per HPI unless specifically indicated above     Medication List       This list is accurate as of: 05/30/15  1:49  PM.  Always use your most recent med list.               cetirizine 10 MG tablet  Commonly known as:  ZYRTEC  Take 10 mg by mouth daily.     cholecalciferol 1000 units tablet  Commonly known as:  VITAMIN D  Take 2,000 Units by mouth daily.     DULoxetine 60 MG capsule  Commonly known as:  CYMBALTA  Take 60 mg by mouth daily.     estradiol 2 MG tablet  Commonly known as:  ESTRACE  Take 2 mg by mouth daily.     omeprazole 40 MG capsule  Commonly known as:  PRILOSEC  Take 1 capsule (40 mg total) by mouth daily.           Objective:    BP 114/79 mmHg  Pulse 89  Temp(Src) 97.4 F (36.3 C) (Oral)  Ht 5\' 4"  (1.626 m)  Wt 183 lb 12.8 oz (83.371 kg)  BMI 31.53 kg/m2  Wt Readings from Last 3 Encounters:  05/30/15 183 lb 12.8 oz (83.371 kg)  02/15/15 184 lb 12.8 oz (83.825 kg)  03/30/13 176 lb 9.6 oz (80.105 kg)    Physical Exam  Constitutional: She is oriented to person, place, and time. She appears well-developed and well-nourished. No distress.  Eyes: Conjunctivae and EOM are normal. Pupils are equal, round, and reactive to light.  Cardiovascular: Normal rate, regular rhythm,  normal heart sounds and intact distal pulses.   No murmur heard. Pulmonary/Chest: Effort normal and breath sounds normal. No respiratory distress. She has no wheezes.  Musculoskeletal: Normal range of motion. She exhibits no edema.       Right knee: Normal. She exhibits normal range of motion, no swelling and no effusion. No tenderness found.       Left knee: She exhibits swelling and effusion. She exhibits normal range of motion, no ecchymosis, no deformity, no laceration, no erythema, normal alignment, no LCL laxity, normal patellar mobility, no bony tenderness, normal meniscus and no MCL laxity. Tenderness found. Lateral joint line tenderness noted.  Neurological: She is alert and oriented to person, place, and time. Coordination normal.  Skin: Skin is warm and dry. No rash noted. She is not  diaphoretic.  Psychiatric: She has a normal mood and affect. Her behavior is normal.  Nursing note and vitals reviewed.  Left knee x-ray: No acute bony abnormality. Await final read by radiology.    Assessment & Plan:   Problem List Items Addressed This Visit    None    Visit Diagnoses    Left lateral knee pain    -  Primary    Likely sprained or strained, do NSAIDs for a week if no improvement, return for injection    Relevant Orders    DG Knee 1-2 Views Left        Follow up plan: Return if symptoms worsen or fail to improve.  Counseling provided for all of the vaccine components Orders Placed This Encounter  Procedures  . DG Knee 1-2 Views Left    Caryl Pina, MD [facility] 05/30/2015, 1:49 PM

## 2015-07-13 ENCOUNTER — Ambulatory Visit (INDEPENDENT_AMBULATORY_CARE_PROVIDER_SITE_OTHER): Payer: No Typology Code available for payment source | Admitting: Obstetrics and Gynecology

## 2015-07-13 ENCOUNTER — Encounter (INDEPENDENT_AMBULATORY_CARE_PROVIDER_SITE_OTHER): Payer: Self-pay | Admitting: Obstetrics and Gynecology

## 2015-07-13 VITALS — BP 125/80 | HR 92 | Ht 65.0 in | Wt 192.0 lb

## 2015-07-13 DIAGNOSIS — Z01419 Encounter for gynecological examination (general) (routine) without abnormal findings: Secondary | ICD-10-CM

## 2015-07-13 DIAGNOSIS — R0981 Nasal congestion: Secondary | ICD-10-CM

## 2015-07-13 MED ORDER — AZITHROMYCIN 250 MG PO TABS
500.0000 mg | ORAL_TABLET | Freq: Once | ORAL | Status: AC
Start: 2015-07-13 — End: 2015-07-13

## 2015-07-13 NOTE — Progress Notes (Signed)
Subjective:       Patient ID: Natasha Boyer is a 60 y.o. female.    HPI: 9 annual gyn exam    The following portions of the patient's history were reviewed and updated as appropriate: allergies, current medications, past family history, past medical history, past social history, past surgical history and problem list.    Review of Systems   Constitutional: Negative.    HENT: Positive for rhinorrhea, sinus pressure and sneezing. Negative for congestion, dental problem, drooling, ear discharge, ear pain, facial swelling, hearing loss, mouth sores, nosebleeds, postnasal drip, sore throat, tinnitus, trouble swallowing and voice change.    Eyes: Negative.    Respiratory: Negative.    Cardiovascular: Negative.    Gastrointestinal: Negative.    Endocrine: Negative.    Genitourinary: Negative.    Musculoskeletal: Negative.    Skin: Negative.    Allergic/Immunologic: Negative.    Neurological: Negative.    Hematological: Negative.    Psychiatric/Behavioral: Negative.            Objective:    Physical Exam   Constitutional: She is oriented to person, place, and time. She appears well-developed and well-nourished.   HENT:   Head: Normocephalic.   Eyes: Pupils are equal, round, and reactive to light.   Neck: Normal range of motion. Neck supple. No tracheal deviation present. No thyromegaly present.   Cardiovascular: Normal rate.    Pulmonary/Chest: Effort normal. Right breast exhibits no inverted nipple, no mass, no nipple discharge, no skin change and no tenderness. Left breast exhibits no inverted nipple, no mass, no nipple discharge, no skin change and no tenderness. Breasts are symmetrical.   Abdominal: Soft. She exhibits no distension and no mass. There is no tenderness. There is no rebound and no guarding.   Genitourinary:   BUS wnl  Vagina mucosa intact  Cervix no gross lesions  Uterus anteflexed small  Adnexa no masses non tender   Musculoskeletal: Normal range of motion.   Neurological: She is alert and oriented to  person, place, and time.   Skin: No rash noted.   Psychiatric: Her behavior is normal.           Assessment:       60 y/o annual gyn exam; sinus infection; Patient had a recent mammogram      Plan:    Z-pak  Procedures    pap

## 2015-07-18 LAB — THIN PREP IG  PAP AND HPV MRNA E6/E7 W/ REFLEX TO HPV 1

## 2015-07-18 LAB — HPV MRNA E6-E7: HPV RNA, High Risk, E6-E7, TMA: NOT DETECTED

## 2015-07-20 ENCOUNTER — Encounter (INDEPENDENT_AMBULATORY_CARE_PROVIDER_SITE_OTHER): Payer: Self-pay

## 2015-07-27 ENCOUNTER — Encounter (INDEPENDENT_AMBULATORY_CARE_PROVIDER_SITE_OTHER): Payer: Self-pay | Admitting: Obstetrics and Gynecology

## 2016-03-12 ENCOUNTER — Encounter (INDEPENDENT_AMBULATORY_CARE_PROVIDER_SITE_OTHER): Payer: Self-pay | Admitting: Specialist

## 2016-03-12 ENCOUNTER — Ambulatory Visit (INDEPENDENT_AMBULATORY_CARE_PROVIDER_SITE_OTHER): Payer: No Typology Code available for payment source | Admitting: Specialist

## 2016-03-12 DIAGNOSIS — Z1239 Encounter for other screening for malignant neoplasm of breast: Secondary | ICD-10-CM | POA: Insufficient documentation

## 2016-03-12 DIAGNOSIS — Z1231 Encounter for screening mammogram for malignant neoplasm of breast: Secondary | ICD-10-CM

## 2016-03-12 NOTE — Progress Notes (Signed)
Breast Cancer Screening Exam    History:  Natasha Boyer is a 61 y.o. female who presents to the office for breast cancer screening.  She is at  risk for breast cancer.    Past Medical History :    Past Medical History:   Diagnosis Date   . Asthma        Past Surgical History:  Past Surgical History:   Procedure Laterality Date   . LUMPECTOMY         Family History:  Family History   Problem Relation Age of Onset   . Breast cancer Mother        Social History:  Social History     Social History   . Marital status: Unknown     Spouse name: N/A   . Number of children: N/A   . Years of education: N/A     Occupational History   . Not on file.     Social History Main Topics   . Smoking status: Current Some Day Smoker   . Smokeless tobacco: Never Used   . Alcohol use Yes   . Drug use: No   . Sexual activity: Not on file     Other Topics Concern   . Not on file     Social History Narrative   . No narrative on file        ALLERGIES:  Allergies   Allergen Reactions   . Penicillins        Medications:  No current outpatient prescriptions on file.    Review of Systems:  Constitutional: Negative.   HENT: Negative.   Respiratory: Negative.   Cardiovascular: Negative.   Gastrointestinal: Negative for nausea, vomiting, diarrhea, constipation and abdominal distention.   Genitourinary: Negative.   Musculoskeletal: Negative.   Skin: no history of jaundice   Allergic/Immunologic: Negative for immunocompromised state.   Neurological: Negative.   Hematological: Negative for adenopathy. Does not bruise/bleed easily.   Psychiatric/Behavioral: Negative.     Objective:  Vitals:    03/12/16 1145   BP: 130/86   BP Site: Left arm   Patient Position: Sitting   Cuff Size: Medium   Pulse: 68   Weight: 86.8 kg (191 lb 6.4 oz)   Height: 1.626 m (5\' 4" )     Body mass index is 32.85 kg/m.     Physical Exam:   Constitutional: Alert, oriented. Body mass index is 32.85 kg/m.  Head: Normocephalic and atraumatic.   Eyes: No scleral icterus.    Neck: Normal range of motion. There is no cervical adenopathy  Cardiovascular: Normal rate, normal S1,2, without murmurs, rubs or gallops  Pulmonary/Chest: Effort normal, equal BS, without rales, stridor, or wheezes  Breast: The breasts are symmetric, without skin changes, nipple changes or dimpling.  There are no palpable masses on either side.  There is no axillary adenopathy.   Musculoskeletal: Normal range of motion.   Neurological: alert and oriented to person, place, and time.   Skin:warm and dry  Psychiatric: Normal mood and affect. Behavior is normal. Judgment and thought content normal.   Nursing note and vitals reviewed.    Radiology:   The reports were authenticated and the imaging was personally reviewed.  They are normal.        Assessment:No evidence of breast cancer      Follow up in one year with repeat mammography and examination.     Leonie Douglas, MD, FACS

## 2016-05-04 ENCOUNTER — Other Ambulatory Visit: Payer: Self-pay | Admitting: Family Medicine

## 2016-05-18 ENCOUNTER — Other Ambulatory Visit: Payer: Self-pay | Admitting: Family Medicine

## 2016-07-02 ENCOUNTER — Encounter: Payer: Self-pay | Admitting: Family Medicine

## 2016-07-02 ENCOUNTER — Ambulatory Visit (INDEPENDENT_AMBULATORY_CARE_PROVIDER_SITE_OTHER): Payer: 59 | Admitting: Family Medicine

## 2016-07-02 VITALS — BP 117/73 | HR 76 | Temp 97.0°F | Ht 64.0 in | Wt 182.0 lb

## 2016-07-02 DIAGNOSIS — J329 Chronic sinusitis, unspecified: Secondary | ICD-10-CM | POA: Diagnosis not present

## 2016-07-02 DIAGNOSIS — J40 Bronchitis, not specified as acute or chronic: Secondary | ICD-10-CM

## 2016-07-02 MED ORDER — HYDROCODONE-HOMATROPINE 5-1.5 MG/5ML PO SYRP: 5.0000 mL | ORAL_SOLUTION | Freq: Four times a day (QID) | ORAL | 0 refills | Status: DC | PRN

## 2016-07-02 MED ORDER — PREDNISONE 10 MG PO TABS: ORAL_TABLET | ORAL | 0 refills | Status: DC

## 2016-07-02 MED ORDER — AMOXICILLIN-POT CLAVULANATE 875-125 MG PO TABS: 1.0000 | ORAL_TABLET | Freq: Two times a day (BID) | ORAL | 0 refills | Status: DC

## 2016-07-02 NOTE — Progress Notes (Signed)
Subjective:  Patient ID: Andrea Pittman, female    DOB: 1955/08/15  Age: 61 y.o. MRN: [id number]  CC: Cough (pt here today c/o cough, hoarseness, runny nose and sneezing.)   HPI MAYIA Pittman presents for Severe cough for 2days. Sore throat and congestion with purulent rhinorrhea for 3-4 days before that. She also has hoarseness. The cough is so bad that she and her husband haven't gotten any sleep the last couple of nights.  History Andrea Pittman has a past medical history of Anxiety; Depression; and GERD (gastroesophageal reflux disease).   She has a past surgical history that includes Abdominal hysterectomy and Bilateral carpal tunnel release.   Her family history is not on file.She reports that she has never smoked. She has never used smokeless tobacco. She reports that she does not drink alcohol or use drugs.  Current Outpatient Prescriptions on File Prior to Visit  Medication Sig Dispense Refill  . cetirizine (ZYRTEC) 10 MG tablet Take 10 mg by mouth daily.    . cholecalciferol (VITAMIN D) 1000 UNITS tablet Take 2,000 Units by mouth daily.    . DULoxetine (CYMBALTA) 60 MG capsule Take 60 mg by mouth daily.    Marland Kitchen estradiol (ESTRACE) 2 MG tablet TAKE ONE TABLET BY MOUTH TWICE DAILY 60 tablet 2  . omeprazole (PRILOSEC) 40 MG capsule TAKE ONE (1) CAPSULE EACH DAY 30 capsule 3   No current facility-administered medications on file prior to visit.     ROS Review of Systems  Constitutional: Negative for activity change, appetite change, chills and fever.  HENT: Positive for congestion, postnasal drip, rhinorrhea and sinus pressure. Negative for ear discharge, ear pain, hearing loss, nosebleeds, sneezing and trouble swallowing.   Respiratory: Negative for chest tightness and shortness of breath.   Cardiovascular: Negative for chest pain and palpitations.  Skin: Negative for rash.    Objective:  BP 117/73   Pulse 76   Temp 97 F (36.1 C) (Oral)   Ht 5\' 4"  (1.626 m)   Wt 182 lb (82.6 kg)    BMI 31.24 kg/m   Physical Exam  Constitutional: She appears well-developed and well-nourished.  HENT:  Head: Normocephalic and atraumatic.  Right Ear: Tympanic membrane and external ear normal. No decreased hearing is noted.  Left Ear: Tympanic membrane and external ear normal. No decreased hearing is noted.  Nose: Mucosal edema present. Right sinus exhibits no frontal sinus tenderness. Left sinus exhibits no frontal sinus tenderness.  Mouth/Throat: No oropharyngeal exudate or posterior oropharyngeal erythema.  Neck: No Brudzinski's sign noted.  Pulmonary/Chest: Breath sounds normal. No respiratory distress.  Lymphadenopathy:       Head (right side): No preauricular adenopathy present.       Head (left side): No preauricular adenopathy present.       Right cervical: No superficial cervical adenopathy present.      Left cervical: No superficial cervical adenopathy present.    Assessment & Plan:   Deysy was seen today for cough.  Diagnoses and all orders for this visit:  Sinobronchitis  Other orders -     amoxicillin-clavulanate (AUGMENTIN) 875-125 MG tablet; Take 1 tablet by mouth 2 (two) times daily. Take all of this medication -     HYDROcodone-homatropine (HYCODAN) 5-1.5 MG/5ML syrup; Take 5 mLs by mouth every 6 (six) hours as needed for cough. -     predniSONE (DELTASONE) 10 MG tablet; Take 5 daily for 2 days followed by 4,3,2 and 1 for 2 days each.   I am  having Ms. Dwight start on amoxicillin-clavulanate, HYDROcodone-homatropine, and predniSONE. I am also having her maintain her DULoxetine, cetirizine, cholecalciferol, estradiol, and omeprazole.  Meds ordered this encounter  Medications  . amoxicillin-clavulanate (AUGMENTIN) 875-125 MG tablet    Sig: Take 1 tablet by mouth 2 (two) times daily. Take all of this medication    Dispense:  20 tablet    Refill:  0  . HYDROcodone-homatropine (HYCODAN) 5-1.5 MG/5ML syrup    Sig: Take 5 mLs by mouth every 6 (six) hours as  needed for cough.    Dispense:  120 mL    Refill:  0  . predniSONE (DELTASONE) 10 MG tablet    Sig: Take 5 daily for 2 days followed by 4,3,2 and 1 for 2 days each.    Dispense:  30 tablet    Refill:  0     Follow-up: Return if symptoms worsen or fail to improve.  Claretta Fraise, M.D.

## 2016-08-07 ENCOUNTER — Other Ambulatory Visit: Payer: Self-pay | Admitting: Family Medicine

## 2016-12-03 ENCOUNTER — Other Ambulatory Visit: Payer: Self-pay | Admitting: Family Medicine

## 2017-05-29 ENCOUNTER — Encounter (INDEPENDENT_AMBULATORY_CARE_PROVIDER_SITE_OTHER): Payer: Self-pay | Admitting: Specialist

## 2017-05-29 ENCOUNTER — Ambulatory Visit (INDEPENDENT_AMBULATORY_CARE_PROVIDER_SITE_OTHER): Payer: No Typology Code available for payment source | Admitting: Specialist

## 2017-05-29 VITALS — BP 116/81 | HR 84 | Temp 98.6°F | Resp 12 | Ht 64.0 in | Wt 194.0 lb

## 2017-05-29 DIAGNOSIS — Z1239 Encounter for other screening for malignant neoplasm of breast: Secondary | ICD-10-CM

## 2017-05-29 DIAGNOSIS — Z1231 Encounter for screening mammogram for malignant neoplasm of breast: Secondary | ICD-10-CM

## 2017-05-29 NOTE — Progress Notes (Signed)
Breast Cancer Screening Exam    History:  Natasha Boyer is a 62 y.o. female who presents to the office for breast cancer screening.  She is at  risk for breast cancer.    Past Medical History :    Past Medical History:   Diagnosis Date   . Asthma        Past Surgical History:  Past Surgical History:   Procedure Laterality Date   . LUMPECTOMY         Family History:  Family History   Problem Relation Age of Onset   . Breast cancer Mother        Social History:  Social History     Social History   . Marital status: Unknown     Spouse name: N/A   . Number of children: N/A   . Years of education: N/A     Occupational History   . Not on file.     Social History Main Topics   . Smoking status: Current Some Day Smoker   . Smokeless tobacco: Never Used   . Alcohol use Yes   . Drug use: No   . Sexual activity: Not on file     Other Topics Concern   . Not on file     Social History Narrative   . No narrative on file        ALLERGIES:  Allergies   Allergen Reactions   . Penicillins        Medications:    Current Outpatient Prescriptions:   .  omeprazole (PRILOSEC) 40 MG capsule, Take 40 mg by mouth daily., Disp: , Rfl: 0  .  valsartan-hydroCHLOROthiazide (DIOVAN-HCT) 160-12.5 MG per tablet, Take 1 tablet by mouth daily., Disp: , Rfl: 1  .  vitamin D, ergocalciferol, (DRISDOL) 50000 UNIT Cap, TAKE ONE CAPSULE BY MOUTH ONE TIME PER WEEK, Disp: , Rfl: 1    Review of Systems:  Constitutional: Negative.   HENT: Negative.   Respiratory: Negative.   Cardiovascular: Negative.   Gastrointestinal: Negative for nausea, vomiting, diarrhea, constipation and abdominal distention.   Genitourinary: Negative.   Musculoskeletal: Negative.   Skin: no history of jaundice   Allergic/Immunologic: Negative for immunocompromised state.   Neurological: Negative.   Hematological: Negative for adenopathy. Does not bruise/bleed easily.   Psychiatric/Behavioral: Negative.     Objective:  Vitals:    05/29/17 1106   BP: 116/81   BP Site: Left  arm   Patient Position: Sitting   Cuff Size: Medium   Pulse: 84   Resp: 12   Temp: 98.6 F (37 C)   TempSrc: Oral   Weight: 88 kg (194 lb)   Height: 1.626 m (5\' 4" )     Body mass index is 33.3 kg/m.     Physical Exam:   Constitutional: Alert, oriented. Body mass index is 33.3 kg/m.  Head: Normocephalic and atraumatic.   Eyes: No scleral icterus.   Neck: Normal range of motion. There is no cervical adenopathy  Cardiovascular: Normal rate, normal S1,2, without murmurs, rubs or gallops  Pulmonary/Chest: Effort normal, equal BS, without rales, stridor, or wheezes  Breast: The breasts are symmetric, without skin changes, nipple changes or dimpling.  There are no palpable masses on either side.  There is no axillary adenopathy.   Musculoskeletal: Normal range of motion.   Neurological: alert and oriented to person, place, and time.   Skin:warm and dry  Psychiatric: Normal mood and affect. Behavior is normal. Judgment and thought content  normal.   Nursing note and vitals reviewed.    Radiology:   The reports were authenticated and the imaging was personally reviewed.  They are normal.        Assessment:No evidence of breast cancer      Follow up in one year with repeat mammography and examination.     Leonie Douglas, MD, FACS

## 2017-05-31 ENCOUNTER — Other Ambulatory Visit: Payer: Self-pay | Admitting: Family Medicine

## 2017-05-31 NOTE — Telephone Encounter (Signed)
Patient needs an appointment ASAP, you can give her enough to get through that appointment

## 2017-05-31 NOTE — Telephone Encounter (Signed)
Last seen 07/02/16  Dr Livia Snellen  Dr D PCP

## 2017-07-01 ENCOUNTER — Other Ambulatory Visit: Payer: Self-pay | Admitting: Family Medicine

## 2017-07-01 NOTE — Telephone Encounter (Signed)
Please review and advise.

## 2017-07-01 NOTE — Telephone Encounter (Signed)
Go ahead and prescribe enough Cymbalta until she can come in for her appointment.

## 2017-07-02 MED ORDER — DULOXETINE HCL 60 MG PO CPEP: ORAL_CAPSULE | ORAL | 0 refills | Status: DC

## 2017-07-02 NOTE — Telephone Encounter (Signed)
Pt notified of RX 

## 2017-07-04 ENCOUNTER — Ambulatory Visit: Payer: Managed Care, Other (non HMO) | Admitting: Family Medicine

## 2017-07-04 ENCOUNTER — Encounter: Payer: Self-pay | Admitting: Family Medicine

## 2017-07-04 VITALS — BP 135/84 | HR 76 | Temp 97.3°F | Ht 64.0 in | Wt 188.0 lb

## 2017-07-04 DIAGNOSIS — K219 Gastro-esophageal reflux disease without esophagitis: Secondary | ICD-10-CM

## 2017-07-04 DIAGNOSIS — G609 Hereditary and idiopathic neuropathy, unspecified: Secondary | ICD-10-CM | POA: Diagnosis not present

## 2017-07-04 DIAGNOSIS — Z13220 Encounter for screening for lipoid disorders: Secondary | ICD-10-CM | POA: Diagnosis not present

## 2017-07-04 DIAGNOSIS — E669 Obesity, unspecified: Secondary | ICD-10-CM

## 2017-07-04 DIAGNOSIS — Z131 Encounter for screening for diabetes mellitus: Secondary | ICD-10-CM

## 2017-07-04 MED ORDER — OMEPRAZOLE 40 MG PO CPDR: 40.0000 mg | DELAYED_RELEASE_CAPSULE | Freq: Every day | ORAL | 3 refills | Status: DC

## 2017-07-04 MED ORDER — DULOXETINE HCL 60 MG PO CPEP: 120.0000 mg | ORAL_CAPSULE | Freq: Every day | ORAL | 1 refills | Status: DC

## 2017-07-04 NOTE — Progress Notes (Signed)
BP 135/84   Pulse 76   Temp (!) 97.3 F (36.3 C) (Oral)   Ht _0  (1.626 m)   Wt 188 lb (85.3 kg)   BMI 32.27 kg/m    Subjective:    Patient ID: Andrea Pittman, female    DOB: 16-Apr-1955, 62 y.o.   MRN: [id number]  HPI: Andrea Pittman is a 62 y.o. female presenting on 07/04/2017 for Peripheral Neuropathy (Neurologist retired and advised to come to PCP ) and Gastroesophageal Reflux   HPI GERD Patient is currently on Meprazole.  She denies any major symptoms or abdominal pain or belching or burping. She denies any blood in her stool or lightheadedness or dizziness.   Peripheral neuropathy Patient continues to have peripheral neuropathy in both of her feet that has been an issue she has had for quite many years and she had extensive work-up by a neurologist.  She would like to have some blood work done today and we will increase the Cymbalta because it has been helping but not enough.  We will try to increase it.  Relevant past medical, surgical, family and social history reviewed and updated as indicated. Interim medical history since our last visit reviewed. Allergies and medications reviewed and updated.  Review of Systems  Constitutional: Negative for chills and fever.  HENT: Negative for congestion, ear discharge and ear pain.   Eyes: Negative for redness and visual disturbance.  Respiratory: Negative for chest tightness and shortness of breath.   Cardiovascular: Negative for chest pain and leg swelling.  Genitourinary: Negative for difficulty urinating and dysuria.  Musculoskeletal: Negative for back pain and gait problem.  Skin: Negative for rash.  Neurological: Negative for light-headedness and headaches.  Psychiatric/Behavioral: Negative for agitation and behavioral problems.  All other systems reviewed and are negative.   Per HPI unless specifically indicated above   Allergies as of 07/04/2017   No Known Allergies     Medication List        Accurate as of 07/04/17  11:10 AM. Always use your most recent med list.          cetirizine 10 MG tablet Commonly known as:  ZYRTEC Take 10 mg by mouth daily.   cholecalciferol 1000 units tablet Commonly known as:  VITAMIN D Take 2,000 Units by mouth daily.   DULoxetine 60 MG capsule Commonly known as:  CYMBALTA TAKE ONE (1) CAPSULE EACH DAY   estradiol 2 MG tablet Commonly known as:  ESTRACE TAKE ONE TABLET BY MOUTH TWICE DAILY   omeprazole 40 MG capsule Commonly known as:  PRILOSEC TAKE ONE (1) CAPSULE EACH DAY          Objective:    BP 135/84   Pulse 76   Temp (!) 97.3 F (36.3 C) (Oral)   Ht _1  (1.626 m)   Wt 188 lb (85.3 kg)   BMI 32.27 kg/m   Wt Readings from Last 3 Encounters:  07/04/17 188 lb (85.3 kg)  07/02/16 182 lb (82.6 kg)  05/30/15 183 lb 12.8 oz (83.4 kg)    Physical Exam  Constitutional: She is oriented to person, place, and time. She appears well-developed and well-nourished. No distress.  Eyes: Conjunctivae are normal.  Cardiovascular: Normal rate, regular rhythm, normal heart sounds and intact distal pulses.  No murmur heard. Pulmonary/Chest: Effort normal and breath sounds normal. No respiratory distress. She has no wheezes.  Musculoskeletal: Normal range of motion. She exhibits no edema or tenderness.  Neurological: She is alert  and oriented to person, place, and time. A sensory deficit (Sensory deficit along with burning in both of her feet but worse in toes) is present. Coordination normal.  Skin: Skin is warm and dry. No rash noted. She is not diaphoretic.  Psychiatric: She has a normal mood and affect. Her behavior is normal.  Nursing note and vitals reviewed.       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  Increased Cymbalta to 120 mg  Continue Prilosec  Follow up plan: Return if symptoms worsen or fail to improve.  Counseling provided for all of the vaccine components No orders of the defined types were placed in this encounter.   Caryl Pina, MD [facility] 07/04/2017, 11:10 AM

## 2017-07-05 LAB — LIPID PANEL
CHOLESTEROL TOTAL: 175 mg/dL (ref 100–199)
Chol/HDL Ratio: 4.9 ratio — ABNORMAL HIGH (ref 0.0–4.4)
HDL: 36 mg/dL — ABNORMAL LOW (ref >39)
LDL CALC: 76 mg/dL (ref 0–99)
TRIGLYCERIDES: 313 mg/dL — AB (ref 0–149)
VLDL Cholesterol Cal: 63 mg/dL — ABNORMAL HIGH (ref 5–40)

## 2017-07-05 LAB — CMP14+EGFR
A/G RATIO: 1.2 (ref 1.2–2.2)
ALBUMIN: 3.8 g/dL (ref 3.6–4.8)
ALK PHOS: 89 IU/L (ref 39–117)
ALT: 37 IU/L — ABNORMAL HIGH (ref 0–32)
AST: 38 IU/L (ref 0–40)
BILIRUBIN TOTAL: 0.2 mg/dL (ref 0.0–1.2)
BUN / CREAT RATIO: 13 (ref 12–28)
BUN: 10 mg/dL (ref 8–27)
CHLORIDE: 100 mmol/L (ref 96–106)
CO2: 25 mmol/L (ref 20–29)
CREATININE: 0.76 mg/dL (ref 0.57–1.00)
Calcium: 9.1 mg/dL (ref 8.7–10.3)
GFR calc Af Amer: 97 mL/min/{1.73_m2} (ref >59)
GFR calc non Af Amer: 84 mL/min/{1.73_m2} (ref >59)
Globulin, Total: 3.2 g/dL (ref 1.5–4.5)
Glucose: 98 mg/dL (ref 65–99)
POTASSIUM: 4.3 mmol/L (ref 3.5–5.2)
Sodium: 139 mmol/L (ref 134–144)
Total Protein: 7 g/dL (ref 6.0–8.5)

## 2017-07-05 LAB — TSH: TSH: 1.25 u[IU]/mL (ref 0.450–4.500)

## 2017-07-05 LAB — CBC WITH DIFFERENTIAL/PLATELET
Basophils Absolute: 0.1 10*3/uL (ref 0.0–0.2)
Basos: 1 %
EOS (ABSOLUTE): 0.4 10*3/uL (ref 0.0–0.4)
EOS: 4 %
HEMATOCRIT: 39.9 % (ref 34.0–46.6)
HEMOGLOBIN: 12.5 g/dL (ref 11.1–15.9)
Immature Grans (Abs): 0 10*3/uL (ref 0.0–0.1)
Immature Granulocytes: 0 %
LYMPHS ABS: 3.2 10*3/uL — AB (ref 0.7–3.1)
Lymphs: 37 %
MCH: 28 pg (ref 26.6–33.0)
MCHC: 31.3 g/dL — ABNORMAL LOW (ref 31.5–35.7)
MCV: 90 fL (ref 79–97)
MONOCYTES: 10 %
MONOS ABS: 0.9 10*3/uL (ref 0.1–0.9)
NEUTROS ABS: 4.2 10*3/uL (ref 1.4–7.0)
Neutrophils: 48 %
Platelets: 384 10*3/uL — ABNORMAL HIGH (ref 150–379)
RBC: 4.46 x10E6/uL (ref 3.77–5.28)
RDW: 14.2 % (ref 12.3–15.4)
WBC: 8.7 10*3/uL (ref 3.4–10.8)

## 2017-07-10 ENCOUNTER — Telehealth: Payer: Self-pay | Admitting: Family Medicine

## 2017-07-12 NOTE — Telephone Encounter (Signed)
Calling back

## 2017-07-12 NOTE — Telephone Encounter (Signed)
Patient aware.

## 2018-01-03 ENCOUNTER — Ambulatory Visit: Payer: Managed Care, Other (non HMO) | Admitting: Family Medicine

## 2018-02-07 ENCOUNTER — Ambulatory Visit (INDEPENDENT_AMBULATORY_CARE_PROVIDER_SITE_OTHER): Payer: No Typology Code available for payment source | Admitting: Obstetrics and Gynecology

## 2018-02-10 ENCOUNTER — Encounter (INDEPENDENT_AMBULATORY_CARE_PROVIDER_SITE_OTHER): Payer: Self-pay | Admitting: Obstetrics and Gynecology

## 2018-02-10 ENCOUNTER — Ambulatory Visit (INDEPENDENT_AMBULATORY_CARE_PROVIDER_SITE_OTHER): Payer: No Typology Code available for payment source | Admitting: Obstetrics and Gynecology

## 2018-02-10 VITALS — BP 121/77 | HR 93 | Ht 64.0 in | Wt 194.0 lb

## 2018-02-10 DIAGNOSIS — Z01419 Encounter for gynecological examination (general) (routine) without abnormal findings: Secondary | ICD-10-CM

## 2018-02-13 ENCOUNTER — Encounter (INDEPENDENT_AMBULATORY_CARE_PROVIDER_SITE_OTHER): Payer: Self-pay | Admitting: Obstetrics and Gynecology

## 2018-02-13 LAB — THINPREP(R) PAP AND HPV MRNA E6/E7: HPV RNA, High Risk, E6-E7, TMA: NOT DETECTED

## 2018-02-13 NOTE — Progress Notes (Signed)
Subjective:       Patient ID: Natasha Boyer is a 62 y.o. female.    HPI: 62 y/o for annual gyn exam    The following portions of the patient's history were reviewed and updated as appropriate: allergies, current medications, past family history, past medical history, past social history, past surgical history and problem list.    Review of Systems   Constitutional: Negative.    HENT: Negative.    Eyes: Negative.    Respiratory: Negative.    Cardiovascular: Negative.    Gastrointestinal: Negative.    Endocrine: Negative.    Genitourinary: Negative.    Musculoskeletal: Negative.    Skin: Negative.    Allergic/Immunologic: Negative.    Neurological: Negative.    Hematological: Negative.    Psychiatric/Behavioral: Negative.            Objective:    Physical Exam  Vitals signs and nursing note reviewed. Exam conducted with a chaperone present.   Constitutional:       Appearance: Normal appearance.   HENT:      Head: Normocephalic.      Nose: Nose normal.   Eyes:      Pupils: Pupils are equal, round, and reactive to light.   Neck:      Musculoskeletal: Neck supple. No neck rigidity or muscular tenderness.   Cardiovascular:      Rate and Rhythm: Normal rate.   Pulmonary:      Effort: Pulmonary effort is normal.   Chest:      Breasts:         Right: No swelling, bleeding, inverted nipple, mass, nipple discharge, skin change or tenderness.         Left: No swelling, bleeding, inverted nipple, mass, nipple discharge, skin change or tenderness.   Abdominal:      General: There is no distension.      Palpations: Abdomen is soft. There is no mass.      Tenderness: There is no tenderness. There is no guarding or rebound.      Hernia: No hernia is present.   Genitourinary:     Comments: BUS wnl  Vagina mucosa intact  Cervix no gross lesions  Uterus anteflexed normal size  Adnexa no masses   Musculoskeletal: Normal range of motion.      Right lower leg: No edema.      Left lower leg: No edema.   Lymphadenopathy:      Cervical: No  cervical adenopathy.   Skin:     Findings: No lesion or rash.   Neurological:      Mental Status: She is alert and oriented to person, place, and time.   Psychiatric:         Behavior: Behavior normal.             Assessment:       62 y/o with annual gyn exam      Plan:      Procedures  Orders Placed This Encounter   Procedures   . Mammo Digital Screening Bilateral W Cad     Standing Status:   Future     Standing Expiration Date:   04/14/2019     Scheduling Instructions:      Please remind patient to bring MD order, referral or prescription with them on the day of the exam. If the order was faxed from the doctor's office to central scheduling please make sure the order is scanned into Epic.     Order  Specific Question:   Select additional PRN/as needed imaging orders     Answer:   None     Order Specific Question:   Reason for Exam:     Answer:   Routine   . ThinPrep(R) Pap and HPV mRNA E6/E7     Order Specific Question:   Source, Pap     Answer:   Cervix     Order Specific Question:   LMP OR STATUS     Answer:   ?????     Order Specific Question:   Status     Answer:   Postmenopausal     Order Specific Question:   Hx HR HPV, Abn Pap, Tx, BX     Answer:   No     Order Specific Question:   Abn bleeding (postcoital, PM)     Answer:   No     Order Specific Question:   Personal/Family Hx GYN CANCER     Answer:   No     Order Specific Question:   NO PAP IN LAST 7 YEARS     Answer:   No     Order Specific Question:   Other finding, specify:     Answer:   ??????       pap

## 2018-02-18 ENCOUNTER — Encounter (INDEPENDENT_AMBULATORY_CARE_PROVIDER_SITE_OTHER): Payer: Self-pay

## 2018-07-09 ENCOUNTER — Other Ambulatory Visit: Payer: Self-pay | Admitting: Family Medicine

## 2018-07-09 DIAGNOSIS — G609 Hereditary and idiopathic neuropathy, unspecified: Secondary | ICD-10-CM

## 2018-07-10 NOTE — Telephone Encounter (Signed)
No answer, no voicemail.

## 2018-07-10 NOTE — Telephone Encounter (Signed)
Dettinger. NTBS LOV 07/04/17

## 2018-07-16 ENCOUNTER — Encounter: Payer: Self-pay | Admitting: Family Medicine

## 2018-07-16 ENCOUNTER — Ambulatory Visit (INDEPENDENT_AMBULATORY_CARE_PROVIDER_SITE_OTHER): Payer: Managed Care, Other (non HMO) | Admitting: Family Medicine

## 2018-07-16 ENCOUNTER — Ambulatory Visit: Payer: Managed Care, Other (non HMO) | Admitting: Family Medicine

## 2018-07-16 ENCOUNTER — Other Ambulatory Visit: Payer: Self-pay

## 2018-07-16 DIAGNOSIS — K219 Gastro-esophageal reflux disease without esophagitis: Secondary | ICD-10-CM

## 2018-07-16 DIAGNOSIS — G609 Hereditary and idiopathic neuropathy, unspecified: Secondary | ICD-10-CM | POA: Diagnosis not present

## 2018-07-16 MED ORDER — OMEPRAZOLE 40 MG PO CPDR: 40.0000 mg | DELAYED_RELEASE_CAPSULE | Freq: Every day | ORAL | 3 refills | Status: DC

## 2018-07-16 MED ORDER — GABAPENTIN 300 MG PO CAPS: 300.0000 mg | ORAL_CAPSULE | Freq: Three times a day (TID) | ORAL | 3 refills | Status: DC

## 2018-07-16 MED ORDER — DULOXETINE HCL 60 MG PO CPEP: 120.0000 mg | ORAL_CAPSULE | Freq: Every day | ORAL | 3 refills | Status: DC

## 2018-07-16 NOTE — Progress Notes (Signed)
Virtual Visit via telephone Note  I connected with Andrea Pittman on 07/16/18 at 1100 by telephone and verified that I am speaking with the correct person using two identifiers. Andrea Pittman is currently located at home and no other people are currently with her during visit. The provider, [name] Georgiana Spillane, MD is located in their office at time of visit.  Call ended at 1116  I discussed the limitations, risks, security and privacy concerns of performing an evaluation and management service by telephone and the availability of in person appointments. I also discussed with the patient that there may be a patient responsible charge related to this service. The patient expressed understanding and agreed to proceed.   History and Present Illness: Neuropathy recheck Duloxetine helps some and she did get some compression stockings and she feels like her feet are worsened and numb. She just says the feeling of tightness in her feet has worsened.  She does notice when she does not take duloxetine. She does take a b12 vitamin.   GERD Patient is currently on omeprazole.  She denies any major symptoms or abdominal pain or belching or burping. She denies any blood in her stool or lightheadedness or dizziness.   No diagnosis found.  Outpatient Encounter Medications as of 07/16/2018  Medication Sig  . cetirizine (ZYRTEC) 10 MG tablet Take 10 mg by mouth daily.  . cholecalciferol (VITAMIN D) 1000 UNITS tablet Take 2,000 Units by mouth daily.  . DULoxetine (CYMBALTA) 60 MG capsule Take 2 capsules (120 mg total) by mouth daily. TAKE ONE (1) CAPSULE EACH DAY  . estradiol (ESTRACE) 2 MG tablet TAKE ONE TABLET BY MOUTH TWICE DAILY  . omeprazole (PRILOSEC) 40 MG capsule Take 1 capsule (40 mg total) by mouth daily.   No facility-administered encounter medications on file as of 07/16/2018.     Review of Systems  Constitutional: Negative for chills and fever.  Eyes: Negative for visual disturbance.   Respiratory: Negative for chest tightness and shortness of breath.   Cardiovascular: Positive for leg swelling. Negative for chest pain.  Musculoskeletal: Negative for back pain and gait problem.  Skin: Negative for rash.  Neurological: Positive for numbness. Negative for weakness, light-headedness and headaches.  Psychiatric/Behavioral: Negative for agitation and behavioral problems.  All other systems reviewed and are negative.   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  Added gabapentin and she will taper up over the first 3 weeks.   I discussed the assessment and treatment plan with the patient. The patient was provided an opportunity to ask questions and all were answered. The patient agreed with the plan and demonstrated an understanding of the instructions.   The patient was advised to call back or seek an in-person evaluation if the symptoms worsen or if the condition fails to improve as anticipated.  The above assessment and management plan was discussed with the patient. The patient verbalized understanding of and has agreed to the management plan. Patient is aware  to call the clinic if symptoms persist or worsen. Patient is aware when to return to the clinic for a follow-up visit. Patient educated on when it is appropriate to go to the emergency department.    I provided 16 minutes of non-face-to-face time during this encounter.    Worthy Rancher, MD

## 2018-07-31 ENCOUNTER — Other Ambulatory Visit: Payer: Self-pay

## 2018-07-31 ENCOUNTER — Ambulatory Visit (INDEPENDENT_AMBULATORY_CARE_PROVIDER_SITE_OTHER): Payer: Managed Care, Other (non HMO) | Admitting: Family Medicine

## 2018-07-31 ENCOUNTER — Encounter: Payer: Self-pay | Admitting: Family Medicine

## 2018-07-31 DIAGNOSIS — R51 Headache: Secondary | ICD-10-CM | POA: Diagnosis not present

## 2018-07-31 DIAGNOSIS — W57XXXA Bitten or stung by nonvenomous insect and other nonvenomous arthropods, initial encounter: Secondary | ICD-10-CM | POA: Diagnosis not present

## 2018-07-31 DIAGNOSIS — R519 Headache, unspecified: Secondary | ICD-10-CM

## 2018-07-31 DIAGNOSIS — R509 Fever, unspecified: Secondary | ICD-10-CM

## 2018-07-31 DIAGNOSIS — Z8742 Personal history of other diseases of the female genital tract: Secondary | ICD-10-CM | POA: Diagnosis not present

## 2018-07-31 MED ORDER — DOXYCYCLINE HYCLATE 100 MG PO TABS: 100.0000 mg | ORAL_TABLET | Freq: Two times a day (BID) | ORAL | 0 refills | Status: AC

## 2018-07-31 MED ORDER — FLUCONAZOLE 150 MG PO TABS: 150.0000 mg | ORAL_TABLET | Freq: Once | ORAL | 2 refills | Status: AC

## 2018-07-31 NOTE — Progress Notes (Signed)
Virtual Visit via telephone Note Due to COVID-19, visit is conducted virtually and was requested by patient. This visit type was conducted due to national recommendations for restrictions regarding the COVID-19 Pandemic (e.g. social distancing) in an effort to limit this patient's exposure and mitigate transmission in our community. All issues noted in this document were discussed and addressed.  A physical exam was not performed with this format.   I connected with Andrea Pittman on 07/31/18 at 1145 by telephone and verified that I am speaking with the correct person using two identifiers. Andrea Pittman is currently located at home and family is currently with them during visit. The provider, Monia Pouch, FNP is located in their office at time of visit.  I discussed the limitations, risks, security and privacy concerns of performing an evaluation and management service by telephone and the availability of in person appointments. I also discussed with the patient that there may be a patient responsible charge related to this service. The patient expressed understanding and agreed to proceed.  Subjective:  Patient ID: Andrea Pittman, female    DOB: 1955-06-27, 63 y.o.   MRN: [id number]  Chief Complaint:  Fatigue; Fever; and Insect Bite (tick)   HPI: Andrea Pittman is a 63 y.o. female presenting on 07/31/2018 for Fatigue; Fever; and Insect Bite (tick)   Pt reports she removed a tick from her back two days ago. States the tick was there for an unknown amount of time. She reports the site is red, irritated, and pruritis. States she has since developed a fever of 101, slight headache, chills, fatigue, arthralgias, abdominal discomfort, and has had diarrhea x 2. She denies a rash, cough, chest pain, shortness of breath, weakness, or confusion.   Fever   This is a new problem. The current episode started in the past 7 days. The problem occurs daily. The problem has been waxing and waning. The maximum  temperature noted was 101 to 101.9 F. The temperature was taken using an oral thermometer. Associated symptoms include abdominal pain, diarrhea and headaches. Pertinent negatives include no chest pain, congestion, coughing, ear pain, muscle aches, nausea, rash, sleepiness, sore throat, urinary pain, vomiting or wheezing. She has tried acetaminophen for the symptoms. The treatment provided mild relief.  Risk factors comment:  Tick bite    Relevant past medical, surgical, family, and social history reviewed and updated as indicated.  Allergies and medications reviewed and updated.   Past Medical History:  Diagnosis Date  . Anxiety   . Depression   . GERD (gastroesophageal reflux disease)     Past Surgical History:  Procedure Laterality Date  . ABDOMINAL HYSTERECTOMY    . BILATERAL CARPAL TUNNEL RELEASE      Social History   Socioeconomic History  . Marital status: Married    Spouse name: Not on file  . Number of children: Not on file  . Years of education: Not on file  . Highest education level: Not on file  Occupational History  . Not on file  Social Needs  . Financial resource strain: Not on file  . Food insecurity:    Worry: Not on file    Inability: Not on file  . Transportation needs:    Medical: Not on file    Non-medical: Not on file  Tobacco Use  . Smoking status: Never Smoker  . Smokeless tobacco: Never Used  Substance and Sexual Activity  . Alcohol use: No  . Drug use: No  . Sexual  activity: Not on file  Lifestyle  . Physical activity:    Days per week: Not on file    Minutes per session: Not on file  . Stress: Not on file  Relationships  . Social connections:    Talks on phone: Not on file    Gets together: Not on file    Attends religious service: Not on file    Active member of club or organization: Not on file    Attends meetings of clubs or organizations: Not on file    Relationship status: Not on file  . Intimate partner violence:    Fear of  current or ex partner: Not on file    Emotionally abused: Not on file    Physically abused: Not on file    Forced sexual activity: Not on file  Other Topics Concern  . Not on file  Social History Narrative  . Not on file    Outpatient Encounter Medications as of 07/31/2018  Medication Sig  . cetirizine (ZYRTEC) 10 MG tablet Take 10 mg by mouth daily.  . cholecalciferol (VITAMIN D) 1000 UNITS tablet Take 2,000 Units by mouth daily.  Marland Kitchen doxycycline (VIBRA-TABS) 100 MG tablet Take 1 tablet (100 mg total) by mouth 2 (two) times daily for 21 days. 1 po bid  . DULoxetine (CYMBALTA) 60 MG capsule Take 2 capsules (120 mg total) by mouth daily. TAKE ONE (1) CAPSULE EACH DAY  . fluconazole (DIFLUCAN) 150 MG tablet Take 1 tablet (150 mg total) by mouth once for 1 dose.  . gabapentin (NEURONTIN) 300 MG capsule Take 1 capsule (300 mg total) by mouth 3 (three) times daily.  Marland Kitchen omeprazole (PRILOSEC) 40 MG capsule Take 1 capsule (40 mg total) by mouth daily.   No facility-administered encounter medications on file as of 07/31/2018.     No Known Allergies  Review of Systems  Constitutional: Positive for chills, fatigue and fever. Negative for activity change, appetite change, diaphoresis and unexpected weight change.  HENT: Negative for congestion, ear pain, rhinorrhea, sinus pressure, sinus pain, sneezing and sore throat.   Respiratory: Negative for cough, chest tightness, shortness of breath and wheezing.   Cardiovascular: Negative for chest pain, palpitations and leg swelling.  Gastrointestinal: Positive for abdominal pain and diarrhea. Negative for abdominal distention, anal bleeding, blood in stool, constipation, nausea, rectal pain and vomiting.  Genitourinary: Negative for decreased urine volume and dysuria.  Musculoskeletal: Positive for arthralgias. Negative for joint swelling, myalgias, neck pain and neck stiffness.  Skin: Negative for color change, pallor and rash.  Neurological: Positive for  headaches. Negative for dizziness, tremors, seizures, syncope, facial asymmetry, speech difficulty, weakness, light-headedness and numbness.  Psychiatric/Behavioral: Negative for confusion.  All other systems reviewed and are negative.        Observations/Objective: No vital signs or physical exam, this was a telephone or virtual health encounter.  Pt alert and oriented, answers all questions appropriately, and able to speak in full sentences.    Assessment and Plan: Lorrie was seen today for fatigue, fever and insect bite.  Diagnoses and all orders for this visit:  Tick bite, initial encounter Fever in adult Acute nonintractable headache, unspecified headache type Due to COVID-19, this visit was conducted over the phone. Reported signs and symptoms concerning for tick born illness. Will empirically treat with below. Report any new or worsening symptoms. Follow up in 2 weeks for reevaluation. Pt aware of signs and symptoms that warrant emergent evaluation. -     doxycycline (VIBRA-TABS) 100  MG tablet; Take 1 tablet (100 mg total) by mouth 2 (two) times daily for 21 days. 1 po bid  History of vulvovaginitis -     fluconazole (DIFLUCAN) 150 MG tablet; Take 1 tablet (150 mg total) by mouth once for 1 dose.     Follow Up Instructions: Return in about 2 weeks (around 08/14/2018), or if symptoms worsen or fail to improve, for follow up.    I discussed the assessment and treatment plan with the patient. The patient was provided an opportunity to ask questions and all were answered. The patient agreed with the plan and demonstrated an understanding of the instructions.   The patient was advised to call back or seek an in-person evaluation if the symptoms worsen or if the condition fails to improve as anticipated.  The above assessment and management plan was discussed with the patient. The patient verbalized understanding of and has agreed to the management plan. Patient is aware to call  the clinic if symptoms persist or worsen. Patient is aware when to return to the clinic for a follow-up visit. Patient educated on when it is appropriate to go to the emergency department.    I provided 15 minutes of non-face-to-face time during this encounter. The call started at 1145. The call ended at 1200. The other time was used for coordination of care.    Monia Pouch, FNP-C [facility] [address] [place], [place] [address] [phone]

## 2018-08-18 ENCOUNTER — Ambulatory Visit: Payer: Managed Care, Other (non HMO) | Admitting: Family Medicine

## 2019-01-06 ENCOUNTER — Encounter (INDEPENDENT_AMBULATORY_CARE_PROVIDER_SITE_OTHER): Payer: Self-pay

## 2019-01-31 ENCOUNTER — Other Ambulatory Visit: Payer: Self-pay | Admitting: Family Medicine

## 2019-01-31 DIAGNOSIS — G609 Hereditary and idiopathic neuropathy, unspecified: Secondary | ICD-10-CM

## 2019-02-02 NOTE — Telephone Encounter (Signed)
Will do 1 month refill but then patient needs an appointment, she was post to have a follow-up for this already.

## 2019-04-03 ENCOUNTER — Other Ambulatory Visit: Payer: Self-pay | Admitting: Family Medicine

## 2019-04-03 DIAGNOSIS — K219 Gastro-esophageal reflux disease without esophagitis: Secondary | ICD-10-CM

## 2019-05-01 ENCOUNTER — Other Ambulatory Visit: Payer: Self-pay | Admitting: Family Medicine

## 2019-05-01 DIAGNOSIS — G609 Hereditary and idiopathic neuropathy, unspecified: Secondary | ICD-10-CM

## 2019-05-01 DIAGNOSIS — K219 Gastro-esophageal reflux disease without esophagitis: Secondary | ICD-10-CM

## 2019-05-01 NOTE — Telephone Encounter (Signed)
Appointment given for March 25th and rx's refilled until appointment date.

## 2019-05-07 ENCOUNTER — Encounter (INDEPENDENT_AMBULATORY_CARE_PROVIDER_SITE_OTHER): Payer: Self-pay

## 2019-05-08 ENCOUNTER — Ambulatory Visit (INDEPENDENT_AMBULATORY_CARE_PROVIDER_SITE_OTHER): Payer: No Typology Code available for payment source

## 2019-05-11 ENCOUNTER — Ambulatory Visit (INDEPENDENT_AMBULATORY_CARE_PROVIDER_SITE_OTHER): Payer: No Typology Code available for payment source

## 2019-05-11 DIAGNOSIS — Z23 Encounter for immunization: Secondary | ICD-10-CM

## 2019-05-28 ENCOUNTER — Other Ambulatory Visit: Payer: Self-pay

## 2019-05-28 ENCOUNTER — Ambulatory Visit: Payer: Managed Care, Other (non HMO) | Admitting: Family Medicine

## 2019-05-28 ENCOUNTER — Encounter: Payer: Self-pay | Admitting: Family Medicine

## 2019-05-28 VITALS — BP 145/78 | HR 80 | Temp 98.6°F | Ht 64.0 in | Wt 189.0 lb

## 2019-05-28 DIAGNOSIS — G609 Hereditary and idiopathic neuropathy, unspecified: Secondary | ICD-10-CM

## 2019-05-28 DIAGNOSIS — E669 Obesity, unspecified: Secondary | ICD-10-CM

## 2019-05-28 DIAGNOSIS — K219 Gastro-esophageal reflux disease without esophagitis: Secondary | ICD-10-CM | POA: Diagnosis not present

## 2019-05-28 NOTE — Progress Notes (Signed)
Pulse 82   Ht '5\' 4"'  (1.626 m)   Wt 189 lb (85.7 kg)   SpO2 99%   BMI 32.44 kg/m    Subjective:   Patient ID: Andrea Pittman, female    DOB: [birth year], 64 y.o.   MRN: [id number]  HPI: Andrea Pittman is a 64 y.o. female presenting on 05/28/2019 for Medical Management of Chronic Issues (10m   HPI Idiopathic neuropathy Patient is coming in for recheck of idiopathic neuropathy, last time we talked she is taken her Cymbalta and then we added gabapentin because the neuropathy is worsening and she still feels like it is worsening and she feels like she cannot walk because of the numbness and tingling in both her hands and her feet and has been causing her more issues and she is unstable because of it.  She is to have a neurologist but he retired a few years ago and she would like to get a new neurologist.  Hyperlipidemia Patient is coming in for recheck of his hyperlipidemia. The patient is currently taking no medication currently is diet-controlled we are monitoring.. They deny any issues with myalgias or history of liver damage from it. They deny any focal numbness or weakness or chest pain.   Patient's hyperlipidemia are more complicated by the patient's obesity obesity.  Discussed weight loss and lifestyle modification and exercise with the patient.   Relevant past medical, surgical, family and social history reviewed and updated as indicated. Interim medical history since our last visit reviewed. Allergies and medications reviewed and updated.  Review of Systems  Constitutional: Negative for chills and fever.  Eyes: Negative for visual disturbance.  Respiratory: Negative for chest tightness and shortness of breath.   Cardiovascular: Negative for chest pain and leg swelling.  Musculoskeletal: Negative for back pain and gait problem.  Skin: Negative for rash.  Neurological: Positive for numbness. Negative for light-headedness and headaches.  Psychiatric/Behavioral: Negative for agitation  and behavioral problems.  All other systems reviewed and are negative.   Per HPI unless specifically indicated above   Allergies as of 05/28/2019   No Known Allergies     Medication List       Accurate as of May 28, 2019 12:05 PM. If you have any questions, ask your nurse or doctor.        STOP taking these medications   cetirizine 10 MG tablet Commonly known as: ZYRTEC Stopped by: JFransisca KaufmannDettinger, MD     TAKE these medications   B-12 5000 MCG Caps Take by mouth daily.   cholecalciferol 1000 units tablet Commonly known as: VITAMIN D Take 2,000 Units by mouth daily.   DULoxetine 60 MG capsule Commonly known as: CYMBALTA Take 2 capsules (120 mg total) by mouth daily. TAKE ONE (1) CAPSULE EACH DAY   estradiol 2 MG tablet Commonly known as: ESTRACE Take 2 mg by mouth daily.   gabapentin 300 MG capsule Commonly known as: NEURONTIN TAKE ONE (1) CAPSULE THREE (3) TIMES EACH DAY   omeprazole 40 MG capsule Commonly known as: PRILOSEC TAKE ONE (1) CAPSULE EACH DAY        Objective:   Pulse 82   Ht '5\' 4"'  (1.626 m)   Wt 189 lb (85.7 kg)   SpO2 99%   BMI 32.44 kg/m   Wt Readings from Last 3 Encounters:  05/28/19 189 lb (85.7 kg)  07/04/17 188 lb (85.3 kg)  07/02/16 182 lb (82.6 kg)    Physical Exam Vitals and nursing  note reviewed.  Constitutional:      General: She is not in acute distress.    Appearance: She is well-developed. She is not diaphoretic.  Eyes:     Conjunctiva/sclera: Conjunctivae normal.  Cardiovascular:     Rate and Rhythm: Normal rate and regular rhythm.     Heart sounds: Normal heart sounds. No murmur.  Pulmonary:     Effort: Pulmonary effort is normal. No respiratory distress.     Breath sounds: Normal breath sounds. No wheezing.  Skin:    General: Skin is warm and dry.     Findings: No rash.  Neurological:     Mental Status: She is alert and oriented to person, place, and time.     Coordination: Coordination normal.    Psychiatric:        Behavior: Behavior normal.       Assessment & Plan:   Problem List Items Addressed This Visit      Digestive   GERD (gastroesophageal reflux disease) - Primary   Relevant Orders   CBC with Differential/Platelet (Completed)   CMP14+EGFR (Completed)   Lipid panel (Completed)   TSH (Completed)   Vitamin B12 (Completed)     Nervous and Auditory   Hereditary and idiopathic peripheral neuropathy   Relevant Orders   CBC with Differential/Platelet (Completed)   CMP14+EGFR (Completed)   Lipid panel (Completed)   TSH (Completed)   Vitamin B12 (Completed)   Ambulatory referral to Neurology     Other   Obesity (BMI 30.0-34.9)   Relevant Orders   CBC with Differential/Platelet (Completed)   CMP14+EGFR (Completed)   Lipid panel (Completed)   TSH (Completed)   Vitamin B12 (Completed)      Neuropathy is worsening, will check labs and if normal consider to send to a new neurologist, she has not had one in a couple years because her is retired. Follow up plan: Return in about 6 months (around 11/28/2019), or if symptoms worsen or fail to improve, for Recheck neuropathy and cholesterol.  Counseling provided for all of the vaccine components Orders Placed This Encounter  Procedures  . CBC with Differential/Platelet  . CMP14+EGFR  . Lipid panel  . TSH  . Vitamin B12  . Ambulatory referral to Neurology    Caryl Pina, MD [facility] 05/28/2019, 12:05 PM

## 2019-05-29 LAB — CMP14+EGFR
ALT: 27 IU/L (ref 0–32)
AST: 34 IU/L (ref 0–40)
Albumin/Globulin Ratio: 1.3 (ref 1.2–2.2)
Albumin: 4.1 g/dL (ref 3.8–4.8)
Alkaline Phosphatase: 90 IU/L (ref 39–117)
BUN/Creatinine Ratio: 14 (ref 12–28)
BUN: 12 mg/dL (ref 8–27)
Bilirubin Total: 0.3 mg/dL (ref 0.0–1.2)
CO2: 25 mmol/L (ref 20–29)
Calcium: 8.9 mg/dL (ref 8.7–10.3)
Chloride: 101 mmol/L (ref 96–106)
Creatinine, Ser: 0.83 mg/dL (ref 0.57–1.00)
GFR calc Af Amer: 86 mL/min/{1.73_m2} (ref >59)
GFR calc non Af Amer: 75 mL/min/{1.73_m2} (ref >59)
Globulin, Total: 3.1 g/dL (ref 1.5–4.5)
Glucose: 98 mg/dL (ref 65–99)
Potassium: 4 mmol/L (ref 3.5–5.2)
Sodium: 139 mmol/L (ref 134–144)
Total Protein: 7.2 g/dL (ref 6.0–8.5)

## 2019-05-29 LAB — CBC WITH DIFFERENTIAL/PLATELET
Basophils Absolute: 0.1 10*3/uL (ref 0.0–0.2)
Basos: 1 %
EOS (ABSOLUTE): 0.7 10*3/uL — ABNORMAL HIGH (ref 0.0–0.4)
Eos: 6 %
Hematocrit: 43.3 % (ref 34.0–46.6)
Hemoglobin: 14.3 g/dL (ref 11.1–15.9)
Immature Grans (Abs): 0.1 10*3/uL (ref 0.0–0.1)
Immature Granulocytes: 1 %
Lymphocytes Absolute: 3.9 10*3/uL — ABNORMAL HIGH (ref 0.7–3.1)
Lymphs: 36 %
MCH: 30.3 pg (ref 26.6–33.0)
MCHC: 33 g/dL (ref 31.5–35.7)
MCV: 92 fL (ref 79–97)
Monocytes Absolute: 0.8 10*3/uL (ref 0.1–0.9)
Monocytes: 8 %
Neutrophils Absolute: 5.1 10*3/uL (ref 1.4–7.0)
Neutrophils: 48 %
Platelets: 395 10*3/uL (ref 150–450)
RBC: 4.72 x10E6/uL (ref 3.77–5.28)
RDW: 12.9 % (ref 11.7–15.4)
WBC: 10.7 10*3/uL (ref 3.4–10.8)

## 2019-05-29 LAB — LIPID PANEL
Chol/HDL Ratio: 4.9 ratio — ABNORMAL HIGH (ref 0.0–4.4)
Cholesterol, Total: 177 mg/dL (ref 100–199)
HDL: 36 mg/dL — ABNORMAL LOW (ref >39)
LDL Chol Calc (NIH): 82 mg/dL (ref 0–99)
Triglycerides: 364 mg/dL — ABNORMAL HIGH (ref 0–149)
VLDL Cholesterol Cal: 59 mg/dL — ABNORMAL HIGH (ref 5–40)

## 2019-05-29 LAB — TSH: TSH: 1.16 u[IU]/mL (ref 0.450–4.500)

## 2019-05-29 LAB — VITAMIN B12: Vitamin B-12: 1936 pg/mL — ABNORMAL HIGH (ref 232–1245)

## 2019-07-02 ENCOUNTER — Other Ambulatory Visit: Payer: Self-pay | Admitting: Family Medicine

## 2019-07-02 DIAGNOSIS — G609 Hereditary and idiopathic neuropathy, unspecified: Secondary | ICD-10-CM

## 2019-07-29 ENCOUNTER — Other Ambulatory Visit: Payer: Self-pay | Admitting: Family Medicine

## 2019-07-29 DIAGNOSIS — G609 Hereditary and idiopathic neuropathy, unspecified: Secondary | ICD-10-CM

## 2019-08-26 ENCOUNTER — Other Ambulatory Visit: Payer: Self-pay | Admitting: Family Medicine

## 2019-08-26 DIAGNOSIS — K219 Gastro-esophageal reflux disease without esophagitis: Secondary | ICD-10-CM

## 2019-09-30 NOTE — Progress Notes (Signed)
[facility] Clinic Note - Initial Visit   Date: 10/01/19  Andrea Pittman MRN: [id number] DOB: 1955/05/01   Dear Dr. Warrick Parisian:  Thank you for your kind referral of Andrea Pittman for consultation of neuropathy. Although her history is well known to you, please allow [place] to reiterate it for the purpose of our medical record. The patient was accompanied to the clinic by self.     History of Present Illness: Andrea Pittman is a 64 y.o. right-handed female with anxiety/depression, bilateral CTS release, and GERD presenting for evaluation of neuropathy.   She was diagnosed with neuropathy ~2010 by EMG by Dr. Melton Alar.  Symptoms starting with tingling in the soles and over time, involves the entire feet.  She has numbness over the toes and sensitivity over the top of the feet, which is worse on the left.  She has severe pain with light pressure. Pain is worse at night time and has become more constant over time.  Starting earlier this year, she began having burning pain in the hands.  Her water has been tested for heavy metals. No history of diabetes, alcohol abuse, or family history of neuropathy. Prior labs looking for causes of neuropathy were normal. She has also seen Dr. Erling Cruz for second opinion who agreed with her diagnosis of idiopathic neuropathy.    She has previously tried many medications, but does not recall the names of all of them.  She was on gabapentin, nortriptyline, amitriptyline, and tramadol.  She has the most benefit with Cymbalta 120mg  daily, but continues to have breakthrough pain interfering with her quality of life, especially because pain prevents her from sleeping at night time.  She walks unassisted and has not suffered any falls.  Recently, she restarted gabapentin 300mg  TID but again, denies any significant benefit.    Out-side paper records, electronic medical record, and images have been reviewed where available and summarized as:  Lab Results   Component Value Date   VITAMINB12 1,936 (H) 05/28/2019   Lab Results  Component Value Date   TSH 1.160 05/28/2019    Past Medical History:  Diagnosis Date  . Anxiety   . Depression   . GERD (gastroesophageal reflux disease)     Past Surgical History:  Procedure Laterality Date  . ABDOMINAL HYSTERECTOMY    . BILATERAL CARPAL TUNNEL RELEASE       Medications:  Outpatient Encounter Medications as of 10/01/2019  Medication Sig Note  . cholecalciferol (VITAMIN D) 1000 UNITS tablet Take 2,000 Units by mouth daily.   . Cyanocobalamin (B-12) 5000 MCG CAPS Take by mouth daily. 10/01/2019: Taking every 3rd day  . DULoxetine (CYMBALTA) 60 MG capsule TAKE TWO CAPSULES EACH DAY   . estradiol (ESTRACE) 2 MG tablet TAKE ONE TABLET BY MOUTH TWICE DAILY   . gabapentin (NEURONTIN) 300 MG capsule TAKE ONE (1) CAPSULE THREE (3) TIMES EACH DAY 10/01/2019: Taking BID  . omeprazole (PRILOSEC) 40 MG capsule TAKE ONE (1) CAPSULE EACH DAY    No facility-administered encounter medications on file as of 10/01/2019.    Allergies: No Known Allergies  Family History: Family History  Problem Relation Age of Onset  . Cancer Mother     Social History: Social History   Tobacco Use  . Smoking status: Never Smoker  . Smokeless tobacco: Never Used  Vaping Use  . Vaping Use: Never used  Substance Use Topics  . Alcohol use: No  . Drug use: No   Social History  Social History Narrative   She does not work and watches her grandson.     Vital Signs:  BP (!) 142/83   Pulse 90   Ht 5\' 3"  (1.6 m)   Wt 190 lb (86.2 kg)   SpO2 97%   BMI 33.66 kg/m    General Medical Exam:   General:  Well appearing, comfortable.   Eyes/ENT: see cranial nerve examination.   Neck:   No carotid bruits. Respiratory:  Clear to auscultation, good air entry bilaterally.   Cardiac:  Regular rate and rhythm, no murmur.   Extremities:  No deformities, edema, or skin discoloration.  Skin:  No rashes or  lesions.  Neurological Exam: MENTAL STATUS including orientation to time, place, person, recent and remote memory, attention span and concentration, language, and fund of knowledge is normal.  Speech is not dysarthric.  CRANIAL NERVES: II:  No visual field defects.   III-IV-VI: Pupils equal round and reactive to light.  Normal conjugate, extra-ocular eye movements in all directions of gaze.  No nystagmus.  No ptosis.   V:  Normal facial sensation.    VII:  Normal facial symmetry and movements.   VIII:  Normal hearing and vestibular function.   IX-X:  Normal palatal movement.   XI:  Normal shoulder shrug and head rotation.   XII:  Normal tongue strength and range of motion, no deviation or fasciculation.  MOTOR:  No atrophy, fasciculations or abnormal movements.  No pronator drift.   Upper Extremity:  Right  Left  Deltoid  5/5   5/5   Biceps  5/5   5/5   Triceps  5/5   5/5   Infraspinatus 5/5  5/5  Medial pectoralis 5/5  5/5  Wrist extensors  5/5   5/5   Wrist flexors  5/5   5/5   Finger extensors  5/5   5/5   Finger flexors  5/5   5/5   Dorsal interossei  5/5   5/5   Abductor pollicis  5/5   5/5   Tone (Ashworth scale)  0  0   Lower Extremity:  Right  Left  Hip flexors  5/5   5/5   Hip extensors  5/5   5/5   Adductor 5/5  5/5  Abductor 5/5  5/5  Knee flexors  5/5   5/5   Knee extensors  5/5   5/5   Dorsiflexors  5/5   5/5   Plantarflexors  5/5   5/5   Toe extensors  5/5   5/5   Toe flexors  5/5   5/5   Tone (Ashworth scale)  0  0   MSRs:  Right        Left                  brachioradialis 2+  2+  biceps 2+  2+  triceps 2+  2+  patellar 3+  3+  ankle jerk 2+  2+  Hoffman no  no  plantar response down  down   SENSORY:  Hyperesthesia to pin prick over the feet, absent vibration at the great toe, intact at the ankle.  Reduced temperature over the feet.  Rhomberg sign is positive.  COORDINATION/GAIT: Normal finger-to- nose-finger.  Intact rapid alternating movements  bilaterally.  Able to rise from a chair without using arms.  Gait narrow based and stable. She is unable to walk on toes, can walk on heels.  Unsteady with tandem gait.  IMPRESSION: 1. 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    - Patient educated on daily foot inspection, fall prevention, and safety precautions around the home.  2. Bilateral hand paresthesias.  Ddx: progression of neuropathy vs recurrent CTS.    - Consider NCS/EMG if symptoms get worse  Return to clinic in 4 months.    Thank you for allowing me to participate in patient's care.  If I can answer any additional questions, I would be pleased to do so.    Sincerely,    Corri Delapaz K. Posey Pronto, DO

## 2019-10-01 ENCOUNTER — Ambulatory Visit: Payer: Managed Care, Other (non HMO) | Admitting: Neurology

## 2019-10-01 ENCOUNTER — Encounter: Payer: Self-pay | Admitting: Neurology

## 2019-10-01 ENCOUNTER — Other Ambulatory Visit: Payer: Self-pay

## 2019-10-01 VITALS — BP 142/83 | HR 90 | Ht 63.0 in | Wt 190.0 lb

## 2019-10-01 DIAGNOSIS — G609 Hereditary and idiopathic neuropathy, unspecified: Secondary | ICD-10-CM

## 2019-10-01 DIAGNOSIS — R202 Paresthesia of skin: Secondary | ICD-10-CM

## 2019-10-01 MED ORDER — PREGABALIN 50 MG PO CAPS: 50.0000 mg | ORAL_CAPSULE | Freq: Two times a day (BID) | ORAL | 5 refills | Status: DC

## 2019-10-01 NOTE — Patient Instructions (Addendum)
Reduce gabapentin to 300mg  twice daily x 1 week, then 300mg  at bedtime for one week, then stop.  After stopping gabapentin, start Lyrica 50mg  twice daily. Continue Cymbalta as you are taking You can try over counter lidocaine ointment (Aspercream, Salonpas) Install hand rail in the shower or use a shower chair, if you feel unsteady.  Take extra caution walking on uneven ground Wear shoes with good ankle support  Return 4 months

## 2019-10-02 ENCOUNTER — Ambulatory Visit: Payer: Self-pay | Admitting: Neurology

## 2019-10-12 ENCOUNTER — Ambulatory Visit: Payer: Self-pay | Admitting: Neurology

## 2019-11-16 ENCOUNTER — Other Ambulatory Visit: Payer: Self-pay | Admitting: Family Medicine

## 2019-11-16 DIAGNOSIS — K219 Gastro-esophageal reflux disease without esophagitis: Secondary | ICD-10-CM

## 2019-12-30 ENCOUNTER — Telehealth: Payer: Self-pay

## 2019-12-30 DIAGNOSIS — K219 Gastro-esophageal reflux disease without esophagitis: Secondary | ICD-10-CM

## 2019-12-30 DIAGNOSIS — G609 Hereditary and idiopathic neuropathy, unspecified: Secondary | ICD-10-CM

## 2019-12-30 MED ORDER — OMEPRAZOLE 40 MG PO CPDR: 40.0000 mg | DELAYED_RELEASE_CAPSULE | Freq: Every day | ORAL | 0 refills | Status: DC

## 2019-12-30 MED ORDER — DULOXETINE HCL 60 MG PO CPEP: ORAL_CAPSULE | ORAL | 0 refills | Status: DC

## 2019-12-30 NOTE — Telephone Encounter (Signed)
  Prescription Request  12/30/2019  What is the name of the medication or equipment?  omeprazole (PRILOSEC) 40 MG capsule DULoxetine (CYMBALTA) 60 MG capsule  Have you contacted your pharmacy to request a refill? (if applicable) yes [facility] but [facility] next apt is 01/13/2020. Pt will be out before apt.  Which pharmacy would you like this sent to? [facility]   Patient notified that their request is being sent to the clinical staff for review and that they should receive a response within 2 business days.

## 2019-12-30 NOTE — Telephone Encounter (Signed)
Medication sent - patient aware 

## 2020-01-13 ENCOUNTER — Telehealth: Payer: Self-pay | Admitting: Family Medicine

## 2020-01-13 ENCOUNTER — Ambulatory Visit: Payer: Managed Care, Other (non HMO) | Admitting: Family Medicine

## 2020-01-13 DIAGNOSIS — K219 Gastro-esophageal reflux disease without esophagitis: Secondary | ICD-10-CM

## 2020-01-13 DIAGNOSIS — G609 Hereditary and idiopathic neuropathy, unspecified: Secondary | ICD-10-CM

## 2020-01-13 MED ORDER — DULOXETINE HCL 60 MG PO CPEP: ORAL_CAPSULE | ORAL | 0 refills | Status: DC

## 2020-01-13 MED ORDER — OMEPRAZOLE 40 MG PO CPDR: 40.0000 mg | DELAYED_RELEASE_CAPSULE | Freq: Every day | ORAL | 0 refills | Status: DC

## 2020-01-13 NOTE — Telephone Encounter (Signed)
Pt had to cancel her appt today because she no longer had child care and the first available was 12/17, is wanting to know if her cymbalta and prilosec can be refilled before her appt

## 2020-01-13 NOTE — Telephone Encounter (Signed)
Pt aware 1 mos refill sent to pharmacy until her appointment

## 2020-01-15 ENCOUNTER — Encounter (INDEPENDENT_AMBULATORY_CARE_PROVIDER_SITE_OTHER): Payer: Self-pay | Admitting: Obstetrics & Gynecology

## 2020-01-15 ENCOUNTER — Ambulatory Visit (INDEPENDENT_AMBULATORY_CARE_PROVIDER_SITE_OTHER): Payer: No Typology Code available for payment source | Admitting: Obstetrics & Gynecology

## 2020-01-15 VITALS — BP 121/80 | HR 94 | Temp 97.5°F | Ht 65.0 in | Wt 204.0 lb

## 2020-01-15 DIAGNOSIS — Z01419 Encounter for gynecological examination (general) (routine) without abnormal findings: Secondary | ICD-10-CM

## 2020-01-15 NOTE — Progress Notes (Signed)
Subjective:      Natasha Boyer is a 64 y.o. G35P2002 female here for a routine exam. She was a pt of Dr Hilda Blades. She is new to me. She works in [employer], lives and care for her 61 y/o mother    She denies medical concerns over the past year.  Last pap : 2019. NEGATIVE and NEGATIVE for HR HPV.      Current menopausal symptoms include: none.    The patient is not sexually active.       Gynecologic History  No LMP recorded. Patient is postmenopausal.  HRT: None    Last mammogram 2020 was normal.    Mother had breast cancer in her 62s, encouraged SBE         Obstetric History  OB History   Gravida Para Term Preterm AB Living   2 2 2     2    SAB IAB Ectopic Multiple Live Births           2      # Outcome Date GA Lbr Len/2nd Weight Sex Delivery Anes PTL Lv   2 Term      Vag-Spont   LIV   1 Term      Vag-Spont   LIV         The following portions of the patient's history were reviewed and updated as appropriate: allergies, current medications, past family history, past medical history, past social history, past surgical history and problem list.    Current Outpatient Medications on File Prior to Visit   Medication Sig Dispense Refill    omeprazole (PRILOSEC) 40 MG capsule Take 40 mg by mouth daily.  0    rosuvastatin (CRESTOR) 10 MG tablet Take 10 mg by mouth daily      valsartan-hydroCHLOROthiazide (DIOVAN-HCT) 160-12.5 MG per tablet Take 1 tablet by mouth daily.  1    vitamin D, ergocalciferol, (DRISDOL) 50000 UNIT Cap TAKE ONE CAPSULE BY MOUTH ONE TIME PER WEEK  1     No current facility-administered medications on file prior to visit.   .      Review of Systems    Pertinent items are noted in HPI.  All other systems were reviewed and are negative except as previously noted in the HPI.      The following GYN related Review of systems is noted:      She denies any abnormal or postmenopausal bleeding.  She denies prolonged or heavy menstrual bleeding.  She denies any breast lumps, masses, breast pain or  nipple discharge.  She denies abdominal or pelvic pain. denies any bloating.  She denies urinary frequency, urgency or dysuria.  She denies urinary incontinence.  She denies change in bowel habits.  She denies increased or malodorous vaginal discharge.  She denies vaginal itching or burning or lesions.  She denies vaginal dryness.    All other systems were reviewed and are negative except as previously noted in the HPI.       Objective:   BP 121/80    Pulse 94    Temp 97.5 F (36.4 C) (Temporal)    Ht 5\' 5"  (1.651 m)    Wt 204 lb (92.5 kg)    BMI 33.95 kg/m   General appearance: alert, appears stated age and cooperative    General appearance: alert, appears stated age and cooperative  Breasts: normal appearance, no masses or tenderness, No nipple retraction or dimpling  Abdomen: soft, non-tender; bowel sounds normal; no masses,  no  organomegaly  Extremities: extremities normal, atraumatic, no cyanosis or edema  Pelvic Exam:  Urethral meatus: no erythema or acute lesions or discharge  Urethra: non tender  Bladder: non tener  Perineum: no erythema, lesion or acute changes noted   Vulva: No labial lesions, erythema, vesicles, ulcers, skin discoloration, warts, tags or other acute skin changes   Vagina: normal vagina, no discharge, exudate, lesion, or erythema  No lesions   Cervix:  no cervical motion tenderness and no lesions  No cervical discharge, erythema, polyp, cyst or mass   Corpus: normal size, shape, contour, position, consistency, mobility, non-tender, no palpable mass   Adnexa:  no mass, fullness, or tenderness   Perianal region:no skin changes, rash or lesion  Anus: no visible hemorrhoids, tags or lesions      Assessment:     Well woman exam with routine gynecologic exam     Patient Active Problem List   Diagnosis    Sinus congestion    Breast cancer screening        1. Well woman exam  - Mammo Digital Screening Bilateral W Cad; Future      Plan:   Pap not done  Monthly breast self exam discussed and  recommended  Mammogram recommended annually and was ordered.  Check results in MyChart.    Orders Placed This Encounter    Mammo Digital Screening Bilateral W Cad           Colon cancer screening recommended  Weight bearing exercise.   Hormone replacement therapy: not applicable.         RTO in 1 year

## 2020-01-27 ENCOUNTER — Other Ambulatory Visit: Payer: Self-pay | Admitting: Family Medicine

## 2020-02-01 ENCOUNTER — Ambulatory Visit: Payer: Managed Care, Other (non HMO) | Admitting: Neurology

## 2020-02-02 DIAGNOSIS — M79673 Pain in unspecified foot: Secondary | ICD-10-CM | POA: Diagnosis not present

## 2020-02-02 DIAGNOSIS — M2012 Hallux valgus (acquired), left foot: Secondary | ICD-10-CM | POA: Diagnosis not present

## 2020-02-19 ENCOUNTER — Encounter: Payer: Self-pay | Admitting: Family Medicine

## 2020-02-19 ENCOUNTER — Other Ambulatory Visit: Payer: Self-pay

## 2020-02-19 ENCOUNTER — Ambulatory Visit (INDEPENDENT_AMBULATORY_CARE_PROVIDER_SITE_OTHER): Payer: Medicare Other | Admitting: Family Medicine

## 2020-02-19 VITALS — BP 133/86 | HR 82 | Ht 63.0 in | Wt 188.0 lb

## 2020-02-19 DIAGNOSIS — K219 Gastro-esophageal reflux disease without esophagitis: Secondary | ICD-10-CM

## 2020-02-19 DIAGNOSIS — G609 Hereditary and idiopathic neuropathy, unspecified: Secondary | ICD-10-CM | POA: Diagnosis not present

## 2020-02-19 DIAGNOSIS — Z1211 Encounter for screening for malignant neoplasm of colon: Secondary | ICD-10-CM | POA: Diagnosis not present

## 2020-02-19 DIAGNOSIS — Z780 Asymptomatic menopausal state: Secondary | ICD-10-CM

## 2020-02-19 DIAGNOSIS — Z23 Encounter for immunization: Secondary | ICD-10-CM | POA: Diagnosis not present

## 2020-02-19 MED ORDER — ESTRADIOL 1 MG PO TABS: 1.0000 mg | ORAL_TABLET | Freq: Every day | ORAL | 3 refills | Status: DC

## 2020-02-19 MED ORDER — DULOXETINE HCL 60 MG PO CPEP: ORAL_CAPSULE | ORAL | 3 refills | Status: DC

## 2020-02-19 MED ORDER — OMEPRAZOLE 40 MG PO CPDR: 40.0000 mg | DELAYED_RELEASE_CAPSULE | Freq: Every day | ORAL | 3 refills | Status: DC

## 2020-02-19 NOTE — Progress Notes (Signed)
BP 133/86   Pulse 82   Ht _0  (1.6 m)   Wt 188 lb (85.3 kg)   SpO2 96%   BMI 33.30 kg/m    Subjective:   Patient ID: Andrea Pittman, female    DOB: 02-Jun-1955, 63 y.o.   MRN: [id number]  HPI: Andrea Pittman is a 64 y.o. female presenting on 02/19/2020 for No chief complaint on file.   HPI GERD Patient is currently on Prilosec.  She denies any major symptoms or abdominal pain or belching or burping. She denies any blood in her stool or lightheadedness or dizziness.   Peripheral neuropathy Patient has peripheral neuropathy and takes Lyrica which she gets from Dr. Posey Pronto and Cymbalta from [place] to help with that mood.  She feels like she is doing very well with that.  Patient is on hormone replacement therapy for postmenopausal symptoms including the estradiol.  She has been on it for quite a few years.  Relevant past medical, surgical, family and social history reviewed and updated as indicated. Interim medical history since our last visit reviewed. Allergies and medications reviewed and updated.  Review of Systems  Constitutional: Negative for chills and fever.  Eyes: Negative for visual disturbance.  Respiratory: Negative for chest tightness and shortness of breath.   Cardiovascular: Negative for chest pain and leg swelling.  Genitourinary: Negative for difficulty urinating.  Musculoskeletal: Negative for back pain and gait problem.  Skin: Negative for rash.  Neurological: Negative for light-headedness and headaches.  Psychiatric/Behavioral: Negative for agitation and behavioral problems.  All other systems reviewed and are negative.   Per HPI unless specifically indicated above   Allergies as of 02/19/2020   No Known Allergies     Medication List       Accurate as of February 19, 2020 11:32 AM. If you have any questions, ask your nurse or doctor.        STOP taking these medications   gabapentin 300 MG capsule Commonly known as: NEURONTIN Stopped by: [name]  [name], MD     TAKE these medications   B-12 5000 MCG Caps Take by mouth daily.   cholecalciferol 1000 units tablet Commonly known as: VITAMIN D Take 2,000 Units by mouth daily.   DULoxetine 60 MG capsule Commonly known as: CYMBALTA TAKE TWO CAPSULES EACH DAY   estradiol 1 MG tablet Commonly known as: ESTRACE Take 1 tablet (1 mg total) by mouth daily. What changed:   medication strength  how much to take  when to take this Changed by: Worthy Rancher, MD   omeprazole 40 MG capsule Commonly known as: PRILOSEC Take 1 capsule (40 mg total) by mouth daily.   pregabalin 50 MG capsule Commonly known as: LYRICA Take 1 capsule (50 mg total) by mouth 2 (two) times daily.        Objective:   BP 133/86   Pulse 82   Ht _1  (1.6 m)   Wt 188 lb (85.3 kg)   SpO2 96%   BMI 33.30 kg/m   Wt Readings from Last 3 Encounters:  02/19/20 188 lb (85.3 kg)  10/01/19 190 lb (86.2 kg)  05/28/19 189 lb (85.7 kg)    Physical Exam Vitals and nursing note reviewed.  Constitutional:      General: She is not in acute distress.    Appearance: She is well-developed and well-nourished. She is not diaphoretic.  Eyes:     Extraocular Movements: EOM normal.     Conjunctiva/sclera: Conjunctivae  normal.  Cardiovascular:     Rate and Rhythm: Normal rate and regular rhythm.     Pulses: Intact distal pulses.     Heart sounds: Normal heart sounds. No murmur heard.   Pulmonary:     Effort: Pulmonary effort is normal. No respiratory distress.     Breath sounds: Normal breath sounds. No wheezing.  Musculoskeletal:        General: No tenderness or edema. Normal range of motion.  Skin:    General: Skin is warm and dry.     Findings: No rash.  Neurological:     Mental Status: She is alert and oriented to person, place, and time.     Coordination: Coordination normal.  Psychiatric:        Mood and Affect: Mood and affect normal.        Behavior: Behavior normal.        Assessment & Plan:   Problem List Items Addressed This Visit      Digestive   GERD (gastroesophageal reflux disease) - Primary   Relevant Medications   omeprazole (PRILOSEC) 40 MG capsule   Other Relevant Orders   Ambulatory referral to Gastroenterology   CBC with Differential/Platelet   CMP14+EGFR   Lipid panel   TSH     Nervous and Auditory   Hereditary and idiopathic peripheral neuropathy   Relevant Medications   DULoxetine (CYMBALTA) 60 MG capsule   Other Relevant Orders   CBC with Differential/Platelet   CMP14+EGFR   Lipid panel   TSH    Other Visit Diagnoses    Postmenopausal       Relevant Orders   CBC with Differential/Platelet   CMP14+EGFR   Lipid panel   TSH   Colon cancer screening       Relevant Orders   Ambulatory referral to Gastroenterology      Continues Cymbalta, will work on tapering off of hormone replacement therapy, she says she has symptoms anyways and does not notice a difference when they increased it in the past.  Continue omeprazole.  Referral for gastroenterology. Follow up plan: Return in about 6 months (around 08/19/2020), or if symptoms worsen or fail to improve, for Neuropathy and GERD.  Counseling provided for all of the vaccine components Orders Placed This Encounter  Procedures  . CBC with Differential/Platelet  . CMP14+EGFR  . Lipid panel  . TSH  . Ambulatory referral to Gastroenterology    Caryl Pina, MD [facility] 02/19/2020, 11:32 AM

## 2020-03-18 ENCOUNTER — Ambulatory Visit: Payer: Managed Care, Other (non HMO) | Admitting: Neurology

## 2020-03-30 ENCOUNTER — Other Ambulatory Visit: Payer: Self-pay | Admitting: Neurology

## 2020-04-19 DIAGNOSIS — M79672 Pain in left foot: Secondary | ICD-10-CM | POA: Diagnosis not present

## 2020-04-19 DIAGNOSIS — M7742 Metatarsalgia, left foot: Secondary | ICD-10-CM | POA: Diagnosis not present

## 2020-05-02 ENCOUNTER — Other Ambulatory Visit: Payer: Self-pay

## 2020-05-02 ENCOUNTER — Encounter: Payer: Self-pay | Admitting: Neurology

## 2020-05-02 ENCOUNTER — Ambulatory Visit: Payer: Medicare Other | Admitting: Neurology

## 2020-05-02 VITALS — BP 170/91 | HR 86 | Ht 64.0 in | Wt 190.4 lb

## 2020-05-02 DIAGNOSIS — R202 Paresthesia of skin: Secondary | ICD-10-CM | POA: Diagnosis not present

## 2020-05-02 DIAGNOSIS — G609 Hereditary and idiopathic neuropathy, unspecified: Secondary | ICD-10-CM

## 2020-05-02 MED ORDER — PREGABALIN 100 MG PO CAPS: 100.0000 mg | ORAL_CAPSULE | Freq: Two times a day (BID) | ORAL | 5 refills | Status: DC

## 2020-05-02 NOTE — Patient Instructions (Addendum)
Increase Lyrica 100mg  twice daily  Nerve testing of the hands.  Do not apply moisterizer, oil, or lotion to your arms and hands on the day of testing  Return to clinic 6 months

## 2020-05-02 NOTE — Progress Notes (Signed)
Follow-up Visit   Date: 05/02/20   CHARLETTA VOIGHT MRN: [id number] DOB: [birth year]   Interim History: Andrea Pittman is a 65 y.o. right-handed Caucasian female with anxiety/depression, bilateral CTS release, and GERD returning to the clinic for follow-up of idiopathic neuropathy (2010).  The patient was accompanied to the clinic by self.  History of present illness: She was diagnosed with neuropathy ~2010 by EMG by Dr. Melton Alar.  Symptoms starting with tingling in the soles and over time, involves the entire feet.  She has numbness over the toes and sensitivity over the top of the feet, which is worse on the left.  She has severe pain with light pressure. Pain is worse at night time and has become more constant over time.  Starting earlier this year, she began having burning pain in the hands.  Her water has been tested for heavy metals. No history of diabetes, alcohol abuse, or family history of neuropathy. Prior labs looking for causes of neuropathy were normal. She has also seen Dr. Erling Cruz for second opinion who agreed with her diagnosis of idiopathic neuropathy.    UPDATE 05/02/2020:  She continues to have tingling over the feet.  At her last visit, I recommended tapering off gabapentin and starting Lyrica.  She is currently on Lyrica 50mg  twice daily.  She denies any dizziness or sedation and is interested in increasing the dose.  She also has similar sensation in the hands which has been getting worse.  She also complains of throbbing pain around the left great toe/bunion.    Medications:  Current Outpatient Medications on File Prior to Visit  Medication Sig Dispense Refill  . cholecalciferol (VITAMIN D) 1000 UNITS tablet Take 2,000 Units by mouth daily.    . DULoxetine (CYMBALTA) 60 MG capsule TAKE TWO CAPSULES EACH DAY 90 capsule 3  . estradiol (ESTRACE) 1 MG tablet Take 1 tablet (1 mg total) by mouth daily. 90 tablet 3  . omeprazole (PRILOSEC) 40 MG capsule Take 1 capsule (40 mg  total) by mouth daily. 90 capsule 3  . pregabalin (LYRICA) 50 MG capsule TAKE ONE CAPSULE BY MOUTH TWICE A DAY 60 capsule 5   No current facility-administered medications on file prior to visit.    Allergies: No Known Allergies  Vital Signs:  BP (!) 170/91   Pulse 86   Ht 5\' 4"  (1.626 m)   Wt 190 lb 6.4 oz (86.4 kg)   SpO2 95%   BMI 32.68 kg/m     Neurological Exam: MENTAL STATUS including orientation to time, place, person, recent and remote memory, attention span and concentration, language, and fund of knowledge is normal.  Speech is not dysarthric.  CRANIAL NERVES:   Normal conjugate, extra-ocular eye movements in all directions of gaze.  No ptosis.    MOTOR:  Motor strength is 5/5 in all extremities, including distally in the feet.  No atrophy, fasciculations or abnormal movements.  No pronator drift.  Tone is normal.    MSRs:  Reflexes are 2+/4 throughout.  SENSORY:  Vibration is absent at the great toe bilaterally, intact at the ankles. Temperature reduced over the feet.    COORDINATION/GAIT:   Gait narrow based and stable. Mild unsteadiness with tandem gait.   Data: n/a  IMPRESSION/PLAN: 1.  Painful idiopathic peripheral neuropathy affecting the feet  - Previously tried:  Gabapentin, nortriptyline, amitriptyline, and tramadol  - Increase Lyrica 100mg  twice daily  - Continue Cymbalta 120mg  twice daily  - Patient educated on daily  foot inspection, fall prevention, and safety precautions around the home.  2. Bilateral hand paresthesias.  Ddx: progression of neuropathy vs recurrent CTS.               - NCS/EMG of the arms  Return to clinic in 6 months  Thank you for allowing me to participate in patient's care.  If I can answer any additional questions, I would be pleased to do so.    Sincerely,    Cecila Satcher K. Posey Pronto, DO

## 2020-05-10 ENCOUNTER — Other Ambulatory Visit: Payer: Self-pay | Admitting: Obstetrics & Gynecology

## 2020-05-11 ENCOUNTER — Encounter (INDEPENDENT_AMBULATORY_CARE_PROVIDER_SITE_OTHER): Payer: Self-pay | Admitting: Obstetrics & Gynecology

## 2020-05-11 DIAGNOSIS — Z01419 Encounter for gynecological examination (general) (routine) without abnormal findings: Secondary | ICD-10-CM

## 2020-05-18 ENCOUNTER — Ambulatory Visit (INDEPENDENT_AMBULATORY_CARE_PROVIDER_SITE_OTHER): Payer: No Typology Code available for payment source | Admitting: Foot & Ankle Surgery

## 2020-05-18 ENCOUNTER — Encounter (INDEPENDENT_AMBULATORY_CARE_PROVIDER_SITE_OTHER): Payer: Self-pay | Admitting: Foot & Ankle Surgery

## 2020-05-18 ENCOUNTER — Ambulatory Visit (INDEPENDENT_AMBULATORY_CARE_PROVIDER_SITE_OTHER): Payer: No Typology Code available for payment source

## 2020-05-18 VITALS — BP 112/82 | HR 80 | Ht 66.0 in | Wt 204.0 lb

## 2020-05-18 DIAGNOSIS — M79672 Pain in left foot: Secondary | ICD-10-CM

## 2020-05-18 DIAGNOSIS — M722 Plantar fascial fibromatosis: Secondary | ICD-10-CM

## 2020-05-18 DIAGNOSIS — M629 Disorder of muscle, unspecified: Secondary | ICD-10-CM

## 2020-05-18 MED ORDER — METHYLPREDNISOLONE 4 MG PO TBPK
ORAL_TABLET | ORAL | 0 refills | Status: AC
Start: 2020-05-18 — End: ?

## 2020-05-18 NOTE — Progress Notes (Signed)
Podiatric Surgery Patient Visit    Patient Name: Natasha Boyer,Natasha Boyer  Assessment:   -Patient is 65 y.o. female with acute onset left plantar fasciitis-versus partial plantar fascial tear    Impression: Possible plantar fascial tear (Acute complicated)    Plan:   -X-ray imaging of the left foot was obtained reviewed and discussed with patient.  Imaging is normal.  She was reassured that she does not have a large bone spur.  -She does seem to have symptoms physical exam consistent with possible partial plantar fascial tear?.  Discussed that this may also be acute plantar fasciitis.  -Recommend cam boot immobilization due to patient's difficulty walking and acute pain.  Boot was provided to her  -Prescription for Medrol Dosepak was sent to patient's pharmacy electronically.  Precautions discussed  -- Diagnosis and treatment plan discussed in detail with the patient.  All questions answered  - Provided HEP for 3x daily stretches.  - Recommend superfeet arch supports, provided info for purchase  - Recommend daily massage and ice to arch and heel with lacrosse ball and/or frozen water bottle    - Discussed steroid injection and/or PT in the future if needed    -F/U 4 to 6 weeks for reevaluation    ----------------------------------------------------------------------------------------------------------------------------  Amount/Complexity data reviewed (Choose 1 for level 2/3/4, 2 for level 5):     A) Independent Radiology interpretation: No    B) (any 3):   New Radiology/Labs ordered: Yes x-rays  Outside Radiology reports/Labs reviewed: No  Involve Independent historian: No  External provider notes reviewed: Yes     C) External provider discussion: No    Risk of Problem/Management: Moderate (99204/99214) prescription medication provided      Subjective:     Chief Complaint:   Chief Complaint   Patient presents with    Foot Problem     Left foot pain , Heel pain       HPI:  Natasha Boyer is a 65 y.o. female.  Patient presents today  with chief complaint of left heel pain.  She is concerned she has a bone spur on the bottom of her left heel.  She reports that her pain occurred acutely about 5 days ago.  She was walking and she felt a pop on the bottom of her heel.  She had some pain preceding that the day before.  She denies bruising or swelling.  She is having difficulty walking or bearing weight on her foot.  She is trying to walk on her toes which makes the pain worse.      Denies N/V/F/C/SOB/CP  All other systems were reviewed and are negative  The following portions of the patient's history were reviewed and updated as appropriate: allergies, current medications, past medical history, past surgical history, family history, and problem list.    Physical Exam:     Vitals:    05/18/20 0815   BP: 112/82   Pulse: 80   SpO2: 98%         Gen: AAOx3, NAD, Well developed  HEENT: Normocephalic, EOMI  Heart: Regular pulse rate and rhythm  Vascular:  Palpable pedal pulses b/l, CFT to all toes <3 sec. No edema. No varicosities  Derm:  Skin intact without wounds or rashes.  No erythema or ecchymosis.  MSK:   The left plantar fashion along the medial and proximal aspect is exquisitely tender.  Windlass mechanism causes severe pain.  Plantar fascial does not appear to be tight or palpable with activation of the windlass mechanism  possibly suggesting mild tear.  Right side was examined and is intact and nonpainful.  MMS 5/5 and symmetric, No other areas of pain or tenderness. No gross deformity. ROM normal without crepitus.  Neuro:  SILT. Neg tinel's sign.  Able to wiggle toes. No motor deficits.     Standing and gait exam: Gait is antalgic      Radiology:     Xrays taken in clinic today: Extremity in the left foot was obtained in multiple weightbearing views: Imaging is essentially normal.  No acute fracture or dislocation.  No bone lesion or degenerative changes.        Claudette Head, DPM FACFAS  [facility] Surgery  Pager  [phone]    ------------------------------------------------------------------------------------------------------------------------------  The review of the patient's medications does not in any way constitute an endorsement, by this clinician,  of their use, dosage, indications, route, efficacy, interactions, or other clinical parameters.    This note was generated within the EP[employer] EMR using Dragon medical speech recognition software and may contain inherent errors or omissions not intended by the user. Grammatical and punctuation errors, random word insertions, deletions, pronoun errors and incomplete sentences are occasional consequences of this technology due to software limitations. Not all errors are caught or corrected.  Although every attempt is made to root out erroneus and incomplete transcription, the note may still not fully represent the intent or opinion of the author. If there are questions or concerns about the content of this note or information contained within the body of this dictation they should be addressed directly with the author for clarification.

## 2020-06-14 ENCOUNTER — Ambulatory Visit (INDEPENDENT_AMBULATORY_CARE_PROVIDER_SITE_OTHER): Payer: Medicare Other | Admitting: Neurology

## 2020-06-14 ENCOUNTER — Other Ambulatory Visit: Payer: Self-pay

## 2020-06-14 DIAGNOSIS — G609 Hereditary and idiopathic neuropathy, unspecified: Secondary | ICD-10-CM | POA: Diagnosis not present

## 2020-06-14 DIAGNOSIS — G5603 Carpal tunnel syndrome, bilateral upper limbs: Secondary | ICD-10-CM

## 2020-06-14 NOTE — Procedures (Signed)
[facility]  [address], [place]  [place],  [address] Tel: [phone] Fax:  [phone] Test Date:  06/14/2020  Patient: Andrea Pittman DOB: [birth year] Physician: Narda Amber, DO  Sex: Female Height: 5\' 4"  Ref Phys: Narda Amber, DO  ID#: [id number]   Technician:    Patient Complaints: This is a 65 year old female with history of neuropathy and bilateral CTS release referred for evaluation of bilateral paresthesias.  NCV & EMG Findings: Extensive electrodiagnostic testing of the right upper extremity and additional studies of the left shows:  1. Right mixed palmar sensory response shows prolonged latency.  Left median sensory response shows prolonged latency (4.2 ms).  Right median and bilateral ulnar sensory responses are within normal limits. 2. Bilateral median and ulnar motor responses are within normal limits. 3. There is no evidence of active or chronic motor axonal loss changes affecting any of the tested muscles.  Motor unit configuration and recruitment pattern is within normal limits.  Impression: 1. Bilateral median neuropathy at or distal to the wrist, consistent with a clinical diagnosis of carpal tunnel syndrome.  Overall, these findings are mild in degree electrically 2. There is no evidence of a sensorimotor polyneuropathy affecting the upper extremities.   ___________________________ Narda Amber, DO    Nerve Conduction Studies Anti Sensory Summary Table   Stim Site NR Peak (ms) Norm Peak (ms) P-T Amp (V) Norm P-T Amp  Left Median Anti Sensory (2nd Digit)  33C  Wrist    4.2 <3.8 14.3 >10  Right Median Anti Sensory (2nd Digit)  33C  Wrist    3.6 <3.8 16.2 >10  Left Ulnar Anti Sensory (5th Digit)  33C  Wrist    2.2 <3.2 23.8 >5  Right Ulnar Anti Sensory (5th Digit)  33C  Wrist    2.3 <3.2 28.6 >5   Motor Summary Table   Stim Site NR Onset (ms) Norm Onset (ms) O-P Amp (mV) Norm O-P Amp Site1 Site2 Delta-0 (ms) Dist (cm) Vel (m/s)  Norm Vel (m/s)  Left Median Motor (Abd Poll Brev)  33C  Wrist    3.3 <4.0 8.9 >5 Elbow Wrist 4.7 27.0 57 >50  Elbow    8.0  8.3         Right Median Motor (Abd Poll Brev)  33C  Wrist    3.4 <4.0 7.8 >5 Elbow Wrist 4.7 28.0 60 >50  Elbow    8.1  7.3         Left Ulnar Motor (Abd Dig Minimi)  33C  Wrist    1.9 <3.1 11.6 >7 B Elbow Wrist 3.6 23.0 64 >50  B Elbow    5.5  11.0  A Elbow B Elbow 1.8 10.0 56 >50  A Elbow    7.3  10.8         Right Ulnar Motor (Abd Dig Minimi)  33C  Wrist    2.0 <3.1 11.6 >7 B Elbow Wrist 3.5 22.0 63 >50  B Elbow    5.5  10.2  A Elbow B Elbow 1.8 10.0 56 >50  A Elbow    7.3  9.4          Comparison Summary Table   Stim Site NR Peak (ms) Norm Peak (ms) P-T Amp (V) Site1 Site2 Delta-P (ms) Norm Delta (ms)  Right Median/Ulnar Palm Comparison (Wrist - 8cm)  33C  Median Palm    2.1 <2.2 29.9 Median Palm Ulnar Palm 0.6   Ulnar [employer]  1.5 <2.2 15.8       EMG   Side Muscle Ins Act Fibs Psw Fasc Number Recrt Dur Dur. Amp Amp. Poly Poly. Comment  Right 1stDorInt Nml Nml Nml Nml Nml Nml Nml Nml Nml Nml Nml Nml N/A  Right Abd Poll Brev Nml Nml Nml Nml Nml Nml Nml Nml Nml Nml Nml Nml N/A  Right PronatorTeres Nml Nml Nml Nml Nml Nml Nml Nml Nml Nml Nml Nml N/A  Right Biceps Nml Nml Nml Nml Nml Nml Nml Nml Nml Nml Nml Nml N/A  Right Triceps Nml Nml Nml Nml Nml Nml Nml Nml Nml Nml Nml Nml N/A  Right Deltoid Nml Nml Nml Nml Nml Nml Nml Nml Nml Nml Nml Nml N/A  Left 1stDorInt Nml Nml Nml Nml Nml Nml Nml Nml Nml Nml Nml Nml N/A  Left Abd Poll Brev Nml Nml Nml Nml Nml Nml Nml Nml Nml Nml Nml Nml N/A  Left PronatorTeres Nml Nml Nml Nml Nml Nml Nml Nml Nml Nml Nml Nml N/A  Left Biceps Nml Nml Nml Nml Nml Nml Nml Nml Nml Nml Nml Nml N/A  Left Triceps Nml Nml Nml Nml Nml Nml Nml Nml Nml Nml Nml Nml N/A  Left Deltoid Nml Nml Nml Nml Nml Nml Nml Nml Nml Nml Nml Nml N/A      Waveforms:

## 2020-06-29 ENCOUNTER — Ambulatory Visit (INDEPENDENT_AMBULATORY_CARE_PROVIDER_SITE_OTHER): Payer: No Typology Code available for payment source | Admitting: Foot & Ankle Surgery

## 2020-08-03 ENCOUNTER — Other Ambulatory Visit: Payer: Self-pay | Admitting: Family Medicine

## 2020-08-19 ENCOUNTER — Encounter: Payer: Self-pay | Admitting: Family Medicine

## 2020-08-19 ENCOUNTER — Ambulatory Visit: Payer: Medicare Other | Admitting: Family Medicine

## 2020-08-26 ENCOUNTER — Other Ambulatory Visit: Payer: Self-pay

## 2020-08-26 ENCOUNTER — Emergency Department (HOSPITAL_COMMUNITY)
Admission: EM | Admit: 2020-08-26 | Discharge: 2020-08-26 | Disposition: A | Discharge status: Home / Self Care | Payer: Medicare Other | Attending: Emergency Medicine | Admitting: Emergency Medicine

## 2020-08-26 ENCOUNTER — Emergency Department (HOSPITAL_COMMUNITY): Payer: Medicare Other

## 2020-08-26 DIAGNOSIS — I700 Atherosclerosis of aorta: Secondary | ICD-10-CM | POA: Diagnosis not present

## 2020-08-26 DIAGNOSIS — K5730 Diverticulosis of large intestine without perforation or abscess without bleeding: Secondary | ICD-10-CM | POA: Diagnosis not present

## 2020-08-26 DIAGNOSIS — R079 Chest pain, unspecified: Secondary | ICD-10-CM | POA: Diagnosis not present

## 2020-08-26 DIAGNOSIS — R110 Nausea: Secondary | ICD-10-CM | POA: Diagnosis not present

## 2020-08-26 DIAGNOSIS — R072 Precordial pain: Secondary | ICD-10-CM | POA: Diagnosis not present

## 2020-08-26 DIAGNOSIS — R0789 Other chest pain: Secondary | ICD-10-CM | POA: Diagnosis present

## 2020-08-26 LAB — CBC WITH DIFFERENTIAL/PLATELET
Abs Immature Granulocytes: 0.07 10*3/uL (ref 0.00–0.07)
Basophils Absolute: 0.2 10*3/uL — ABNORMAL HIGH (ref 0.0–0.1)
Basophils Relative: 1 %
Eosinophils Absolute: 0.4 10*3/uL (ref 0.0–0.5)
Eosinophils Relative: 3 %
HCT: 45.5 % (ref 36.0–46.0)
Hemoglobin: 15 g/dL (ref 12.0–15.0)
Immature Granulocytes: 1 %
Lymphocytes Relative: 26 %
Lymphs Abs: 3.2 10*3/uL (ref 0.7–4.0)
MCH: 29.8 pg (ref 26.0–34.0)
MCHC: 33 g/dL (ref 30.0–36.0)
MCV: 90.5 fL (ref 80.0–100.0)
Monocytes Absolute: 0.9 10*3/uL (ref 0.1–1.0)
Monocytes Relative: 8 %
Neutro Abs: 7.5 10*3/uL (ref 1.7–7.7)
Neutrophils Relative %: 61 %
Platelets: 419 10*3/uL — ABNORMAL HIGH (ref 150–400)
RBC: 5.03 MIL/uL (ref 3.87–5.11)
RDW: 13.2 % (ref 11.5–15.5)
WBC: 12.2 10*3/uL — ABNORMAL HIGH (ref 4.0–10.5)
nRBC: 0 % (ref 0.0–0.2)

## 2020-08-26 LAB — COMPREHENSIVE METABOLIC PANEL
ALT: 82 U/L — ABNORMAL HIGH (ref 0–44)
AST: 88 U/L — ABNORMAL HIGH (ref 15–41)
Albumin: 3.8 g/dL (ref 3.5–5.0)
Alkaline Phosphatase: 96 U/L (ref 38–126)
Anion gap: 10 (ref 5–15)
BUN: 9 mg/dL (ref 8–23)
CO2: 25 mmol/L (ref 22–32)
Calcium: 9.1 mg/dL (ref 8.9–10.3)
Chloride: 103 mmol/L (ref 98–111)
Creatinine, Ser: 0.81 mg/dL (ref 0.44–1.00)
GFR, Estimated: >60 mL/min (ref >60)
Glucose, Bld: 132 mg/dL — ABNORMAL HIGH (ref 70–99)
Potassium: 3.6 mmol/L (ref 3.5–5.1)
Sodium: 138 mmol/L (ref 135–145)
Total Bilirubin: 0.9 mg/dL (ref 0.3–1.2)
Total Protein: 8.1 g/dL (ref 6.5–8.1)

## 2020-08-26 LAB — TROPONIN I (HIGH SENSITIVITY)
Troponin I (High Sensitivity): 11 ng/L (ref <18)
Troponin I (High Sensitivity): 8 ng/L (ref <18)

## 2020-08-26 LAB — LIPASE, BLOOD: Lipase: 29 U/L (ref 11–51)

## 2020-08-26 MED ORDER — ALUM & MAG HYDROXIDE-SIMETH 400-400-40 MG/5ML PO SUSP: 15.0000 mL | Freq: Four times a day (QID) | ORAL | 0 refills | Status: DC | PRN

## 2020-08-26 MED ORDER — NITROGLYCERIN 0.4 MG SL SUBL
0.4000 mg | SUBLINGUAL_TABLET | SUBLINGUAL | Status: DC | PRN
  Administered 2020-08-26: 0.4 mg via SUBLINGUAL
  Filled 2020-08-26: qty 1

## 2020-08-26 MED ORDER — NITROGLYCERIN 0.4 MG SL SUBL
0.4000 mg | SUBLINGUAL_TABLET | Freq: Once | SUBLINGUAL | Status: AC
  Administered 2020-08-26: 0.4 mg via SUBLINGUAL
  Filled 2020-08-26: qty 1

## 2020-08-26 MED ORDER — FENTANYL CITRATE (PF) 100 MCG/2ML IJ SOLN
50.0000 ug | Freq: Once | INTRAMUSCULAR | Status: AC
  Administered 2020-08-26: 50 ug via INTRAVENOUS
  Filled 2020-08-26: qty 2

## 2020-08-26 MED ORDER — ALUM & MAG HYDROXIDE-SIMETH 200-200-20 MG/5ML PO SUSP
30.0000 mL | Freq: Once | ORAL | Status: AC
  Administered 2020-08-26: 30 mL via ORAL
  Filled 2020-08-26: qty 30

## 2020-08-26 MED ORDER — ASPIRIN 325 MG PO TABS
325.0000 mg | ORAL_TABLET | Freq: Every day | ORAL | Status: DC
  Administered 2020-08-26: 325 mg via ORAL
  Filled 2020-08-26: qty 1

## 2020-08-26 MED ORDER — ONDANSETRON HCL 4 MG/2ML IJ SOLN
4.0000 mg | Freq: Once | INTRAMUSCULAR | Status: AC
  Administered 2020-08-26: 4 mg via INTRAVENOUS
  Filled 2020-08-26: qty 2

## 2020-08-26 MED ORDER — IOHEXOL 350 MG/ML SOLN
100.0000 mL | Freq: Once | INTRAVENOUS | Status: AC | PRN
  Administered 2020-08-26: 100 mL via INTRAVENOUS

## 2020-08-26 MED ORDER — ONDANSETRON 4 MG PO TBDP: 4.0000 mg | ORAL_TABLET | Freq: Three times a day (TID) | ORAL | 0 refills | Status: DC | PRN

## 2020-08-26 NOTE — ED Provider Notes (Signed)
[facility] EMERGENCY DEPARTMENT Provider Note   CSN: [id number] Arrival date & time: 08/26/20  1711     History Chief Complaint  Patient presents with   Chest Pain    Andrea Pittman is a 65 y.o. female with a history of anxiety, depression, GERD, obesity.  Patient presents emergency department with a chief complaint of midsternal chest pain.  Patient reports that chest pain was present when she woke at 0 830 this morning.  Chest pain has gotten progressively worse since then.  Patient reports that chest pain radiates to her epigastrium as well as to her back.  Patient describes her pain as a "stabbing numbing pain."  Patient denies any alleviating or aggravating factors.  Patient rates pain 10/10 on the pain scale.  Patient endorses associated nausea.  Patient denies any vomiting, diaphoresis, or shortness of breath.  Patient reports that she had a similar episode of pain on Tuesday that was associated with dizziness however her chest pain was not as severe.  Patient denies any palpitations, leg swelling, syncope, numbness, weakness, fever, chills, abdominal pain.    Patient Dors is being on estradiol.  Patient denies any smoking history.   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     Family History   Problem Relation Age of Onset   Cancer Mother     Social History   Tobacco Use   Smoking status: Never   Smokeless tobacco: Never  Vaping Use   Vaping Use: Never used  Substance Use Topics   Alcohol use: No   Drug use: No    Home Medications Prior to Admission medications   Medication Sig Start Date End Date Taking? Authorizing Provider  cholecalciferol (VITAMIN D) 1000 UNITS tablet Take 2,000 Units by mouth daily.    [provider]  DULoxetine (CYMBALTA) 60 MG capsule TAKE TWO CAPSULES EACH DAY 02/19/20   Dettinger, [name], MD  estradiol (ESTRACE) 1 MG tablet Take 1 tablet (1 mg total) by mouth daily. 02/19/20   Dettinger, [name], MD  omeprazole (PRILOSEC) 40 MG capsule Take 1 capsule (40 mg total) by mouth daily. 02/19/20   Dettinger, [name], MD  pregabalin (LYRICA) 100 MG capsule Take 1 capsule (100 mg total) by mouth 2 (two) times daily. 05/02/20   Narda Amber K, DO    Allergies    Patient has no known allergies.  Review of Systems   Review of Systems  Constitutional:  Negative for chills and fever.  HENT:  Negative for congestion, rhinorrhea and sore throat.   Eyes:  Negative for visual disturbance.  Respiratory:  Negative for cough and shortness of breath.   Cardiovascular:  Positive for chest pain. Negative for palpitations and leg swelling.  Gastrointestinal:  Positive  for nausea. Negative for abdominal distention, abdominal pain, blood in stool, constipation, diarrhea and vomiting.  Genitourinary:  Negative for difficulty urinating, dysuria, frequency and hematuria.  Musculoskeletal:  Negative for back pain and neck pain.  Skin:  Negative for color change, pallor, rash and wound.  Neurological:  Negative for dizziness, tremors, seizures, syncope, facial asymmetry, speech difficulty, weakness, light-headedness, numbness and headaches.  Psychiatric/Behavioral:  Negative for confusion.    Physical Exam Updated Vital Signs BP (!) 177/100   Pulse  83   Temp 97.7 F (36.5 C)   Resp 12   SpO2 98%   Physical Exam Vitals and nursing note reviewed.  Constitutional:      General: She is not in acute distress.    Appearance: She is obese. She is not ill-appearing, toxic-appearing or diaphoretic.     Comments: Appears uncomfortable due to complaints of chest pain  HENT:     Head: Normocephalic.  Eyes:     General: No scleral icterus.       Right eye: No discharge.        Left eye: No discharge.  Neck:     Vascular: No carotid bruit.  Cardiovascular:     Rate and Rhythm: Normal rate.     Pulses:          Carotid pulses are 2+ on the right side and 2+ on the left side.      Radial pulses are 2+ on the right side and 2+ on the left side.     Heart sounds: Normal heart sounds.  Pulmonary:     Effort: Pulmonary effort is normal. No bradypnea or respiratory distress.     Breath sounds: Normal breath sounds. No stridor.  Chest:     Chest wall: Tenderness present. No mass, lacerations, deformity, swelling, crepitus or edema.     Comments: Mild tenderness to sternum just proximal to the xiphoid process Abdominal:     General: Bowel sounds are normal. There is no distension. There are no signs of injury.     Palpations: Abdomen is soft. There is no mass or pulsatile mass.     Tenderness: There is abdominal tenderness in the epigastric area. There is no guarding or rebound.     Comments: Mild tenderness to epigastrium  Musculoskeletal:     Cervical back: Normal range of motion and neck supple. No edema, erythema, signs of trauma, rigidity, torticollis or crepitus. No pain with movement, spinous process tenderness or muscular tenderness. Normal range of motion.     Right lower leg: Normal. No tenderness. No edema.     Left lower leg: Normal. No tenderness. No edema.  Skin:    General: Skin is warm and dry.  Neurological:     General: No focal deficit present.     Mental Status: She is alert.  Psychiatric:        Behavior: Behavior  is cooperative.    ED Results / Procedures / Treatments   Labs (all labs ordered are listed, but only abnormal results are displayed) Labs Reviewed  CBC WITH DIFFERENTIAL/PLATELET - Abnormal; Notable for the following components:      Result Value   WBC 12.2 (*)    Platelets 419 (*)    Basophils Absolute 0.2 (*)    All other components within normal limits  COMPREHENSIVE METABOLIC PANEL - Abnormal; Notable for the following components:   Glucose, Bld 132 (*)    AST 88 (*)    ALT 82 (*)  All other components within normal limits  LIPASE, BLOOD  TROPONIN I (HIGH SENSITIVITY)  TROPONIN I (HIGH SENSITIVITY)    EKG EKG Interpretation  Date/Time:  Friday August 26 2020 17:18:38 EDT Ventricular Rate:  81 PR Interval:  150 QRS Duration: 82 QT Interval:  378 QTC Calculation: 439 R Axis:   12 Text Interpretation: Normal sinus rhythm Cannot rule out Anterior infarct , age undetermined Abnormal ECG Confirmed by Madalyn Rob [phone]) on 08/26/2020 7:36:01 PM  Radiology DG Chest Portable 1 View  Result Date: 08/26/2020 CLINICAL DATA:  Chest pain since yesterday, worsening today. EXAM: PORTABLE CHEST 1 VIEW COMPARISON:  05/19/2012. FINDINGS: Cardiac silhouette is normal in size. No mediastinal or hilar masses. No evidence of adenopathy. Clear lungs.  No convincing pleural effusion.  No pneumothorax. Skeletal structures are grossly intact. IMPRESSION: No active disease. Electronically Signed   By: Lajean Manes M.D.   On: 08/26/2020 18:17   CT Angio Chest/Abd/Pel for Dissection W and/or Wo Contrast  Result Date: 08/26/2020 CLINICAL DATA:  Centralized chest pain and epigastric pain EXAM: CT ANGIOGRAPHY CHEST, ABDOMEN AND PELVIS TECHNIQUE: Non-contrast CT of the chest was initially obtained. Multidetector CT imaging through the chest, abdomen and pelvis was performed using the standard protocol during bolus administration of intravenous contrast. Multiplanar reconstructed images and MIPs  were obtained and reviewed to evaluate the vascular anatomy. CONTRAST:  188mL OMNIPAQUE IOHEXOL 350 MG/ML SOLN COMPARISON:  Radiograph 08/26/2020 FINDINGS: CTA CHEST FINDINGS Cardiovascular: Noncontrast CT of the chest performed initially reveals a normal caliber aorta. No hyperdense mural thickening to suggest intramural hematoma. Postcontrast administration there is satisfactory opacification the thoracic aorta. No acute luminal abnormality is seen. No periaortic stranding or hemorrhage. Normal 3 vessel branching of the aortic arch. Proximal great vessels are unremarkable. Central pulmonary arteries are normal caliber. No large central or lobar filling defects within limitations non tailored examination of the pulmonary arteries. Normal heart size. No pericardial effusion. No major venous abnormalities within the limitations of this arterial phase exam. Mediastinum/Nodes: No mediastinal fluid or gas. 13 mm hypoattenuating left thyroid nodule. Not clinically significant; no follow-up imaging recommended (ref: J Am Coll Radiol. 2015 Feb;12(2): 143-50). No acute abnormality of the trachea or esophagus. No worrisome mediastinal, hilar or axillary adenopathy. Lungs/Pleura: Atelectatic changes in the lungs likely accentuated by imaging during exhalation as evidenced by posterior bowing of the trachea. No consolidation, features of edema, pneumothorax, or effusion. No suspicious pulmonary nodules or masses. Musculoskeletal: No chest wall abnormality. No acute or significant osseous findings. Review of the MIP images confirms the above findings. CTA ABDOMEN AND PELVIS FINDINGS VASCULAR Aorta: Minimal atherosclerotic plaque within the abdominal aorta. No aneurysm or ectasia. No acute luminal abnormality. No periaortic stranding or hemorrhage. Celiac: Patent without evidence of aneurysm, dissection, vasculitis or significant stenosis. SMA: Patent without evidence of aneurysm, dissection, vasculitis or significant stenosis.  Renals: Single renal arteries bilaterally. Both renal arteries are patent without evidence of aneurysm, dissection, vasculitis, fibromuscular dysplasia or significant stenosis. IMA: Patent ostium. Normally opacified. No evidence of aneurysm, dissection or vasculitis. Inflow and proximal outflow: Inflow vasculature including the common, internal external iliac arteries are widely patent. No acute luminal abnormality. No aneurysm or ectasia. Included portions of the outflow vasculature including the common, proximal superficial and deep femoral arteries are widely patent without acute abnormality or concerning narrowing or occlusion. Veins: No obvious venous abnormality within the limitations of this arterial phase study. Review of the MIP images confirms the above findings. NON-VASCULAR Hepatobiliary: Diffuse hepatic hypoattenuation  compatible with hepatic steatosis. Sparing along the gallbladder fossa. Smooth liver surface contour. No concerning focal liver lesions. Gallbladder is unremarkable. No visible calcified gallstones or significant biliary ductal dilatation accounting for patient age. Pancreas: Few fatty clefts noted towards the pancreatic head. No pancreatic ductal dilatation or surrounding inflammatory changes. Spleen: Normal in size. No concerning splenic lesions. Adrenals/Urinary Tract: Normal adrenal glands. Kidneys are normally located with symmetric enhancement. No suspicious renal lesion, urolithiasis or hydronephrosis. Urinary bladder is unremarkable for the degree of distention. Stomach/Bowel: Distal esophagus, stomach and duodenum are unremarkable. No small bowel thickening or dilatation. Appendix is not visualized. No focal inflammation the vicinity of the cecum to suggest an occult appendicitis. No colonic dilatation or wall thickening. Few scattered colonic diverticula without focal inflammation to suggest diverticulitis. No evidence of bowel obstruction. Lymphatic: No suspicious or enlarged  lymph nodes in the included lymphatic chains. Reproductive: Uterus is surgically absent. No concerning adnexal lesions. Other: No abdominopelvic free fluid or free gas. No bowel containing hernias. Bilateral fat containing inguinal hernias. Musculoskeletal: Multilevel degenerative changes are present in the imaged portions of the spine. Additional degenerative changes in the hips and pelvis. No acute osseous abnormality or suspicious osseous lesion. Review of the MIP images confirms the above findings. IMPRESSION: No evidence of acute aortic syndrome or other acute or worrisome vascular abnormality. Aortic Atherosclerosis (ICD10-I70.0). No significant plaque narrowing or stenosis. Hypoventilatory changes/atelectasis, likely accentuated by imaging during exhalation. No other acute process seen in the chest, abdomen or pelvis to provide cause for patient's symptoms. Prior hysterectomy. Hepatic steatosis. Electronically Signed   By: Lovena Le M.D.   On: 08/26/2020 21:02    Procedures Procedures   Medications Ordered in ED Medications  aspirin tablet 325 mg (325 mg Oral Given 08/26/20 1901)  nitroGLYCERIN (NITROSTAT) SL tablet 0.4 mg (0.4 mg Sublingual Given 08/26/20 1904)  alum & mag hydroxide-simeth (MAALOX/MYLANTA) 200-200-20 MG/5ML suspension 30 mL (30 mLs Oral Given 08/26/20 1820)  ondansetron (ZOFRAN) injection 4 mg (4 mg Intravenous Given 08/26/20 1941)  nitroGLYCERIN (NITROSTAT) SL tablet 0.4 mg (0.4 mg Sublingual Given 08/26/20 1943)  iohexol (OMNIPAQUE) 350 MG/ML injection 100 mL (100 mLs Intravenous Contrast Given 08/26/20 2019)  fentaNYL (SUBLIMAZE) injection 50 mcg (50 mcg Intravenous Given 08/26/20 2212)    ED Course  I have reviewed the triage vital signs and the nursing notes.  Pertinent labs & imaging results that were available during my care of the patient were reviewed by me and considered in my medical decision making (see chart for details).    MDM Rules/Calculators/A&P                           Alert 65 year old female no acute stress, nontoxic-appearing.  Patient appears uncomfortable due to complaints of chest pain.  Patient presents emerged part with a chief complaint of midsternal chest pain.  Patient reports that chest pain began upon waking this morning at 0 830.  Pain has gotten progressively worse since then.  Patient states that pain radiates to her back and epigastric area.  Patient describes pain on his "stabbing numbing pain."  Associated nausea.  Denies any cardiac history.  On physical exam patient has mild tenderness to epigastrium and sternum just proximal to the glenoid process.  +2 radial pulse bilaterally, +2 carotid pulse bilaterally.  No carotid bruits.  Lungs clear to auscultation bilaterally.  ACS work-up obtained. Chest x-ray shows no active cardiopulmonary disease. EKG shows normal sinus rhythm. Initial  troponin 11 She was given 325 mg of ASA.  She was given nitroglycerin.  Patient reports improvement in pain after receiving 1 nitroglycerin.  Rating pain 5/10 on pain scale.    CBC shows leukocytosis at 12.2; no signs of anemia. CMP shows increased AST and ALT.  No other abnormalities. Lipase within normal limits.  We will also obtain dissection study.    Second troponin 8 with delta of -3.  Low suspicion for ACS at this time.  Dissection study shows: -No evidence of acute aortic syndrome or other acute or worrisome vascular abnormality. -Aortic atherosclerosis without significant plaque narrowing or stenosis. -No other acute process seen in the chest, abdomen or pelvis to provide cause for patient's symptoms.  Patient continues to complain of chest discomfort.  Patient given fentanyl with marked improvement in her symptoms.  Shared decision made with patient about admission for continued observation versus discharge home with close PCP follow-up.  Patient requests discharge at this time.  Based on patient's work-up suspect noncardiac  cause of symptoms.  With patient's history of GERD suspect possible peptic ulcer disease.  Patient is currently taking omeprazole daily.  We will give patient prescription for Mylanta and Zofran.  We will give patient information to follow-up with gastroenterologist.  Patient given strict return precautions.  Patient expressed understanding of all instructions and is agreeable with this plan  Patient was discussed with and evaluated by Dr. Roslynn Amble.   Final Clinical Impression(s) / ED Diagnoses Final diagnoses:  Precordial chest pain    Rx / DC Orders ED Discharge Orders          Ordered    ondansetron (ZOFRAN ODT) 4 MG disintegrating tablet  Every 8 hours PRN        08/26/20 2335    alum & mag hydroxide-simeth [facility] MAXIMUM STRENGTH) 400-400-40 MG/5ML suspension  Every 6 hours PRN        08/26/20 2335             Loni Beckwith, PA-C 08/27/20 0302    Lucrezia Starch, MD 08/29/20 [id number]

## 2020-08-26 NOTE — ED Notes (Signed)
Patient transported to CT 

## 2020-08-26 NOTE — ED Provider Notes (Signed)
Emergency Medicine Provider Triage Evaluation Note  Andrea Pittman , a 65 y.o. female  was evaluated in triage.  Pt complains of central chest pain that has been persistent since she woke up this morning.  She describes it as a 10 pain.  She reports that she has it frequently, but this is the worst that its been.  She attributes it to her GERD symptoms.  She takes omeprazole 40 mg daily, but does not take any abortive therapy.  She was in town visiting her granddaughter and was frustrated that she had to leave to come to the hospital.  She states that her chest pain is associated with nausea and diaphoresis.  Denies any difficulty breathing, fevers, chills, or cough.  Review of Systems  Positive: Chest pain, diaphoresis, N/V. Negative: Cough, SOB, fevers.  Physical Exam  BP (!) 158/102 (BP Location: Left Arm)   Pulse 87   Temp 97.7 F (36.5 C)   Resp 18   SpO2 100%  Gen:   Awake, no distress   Resp:  Normal effort. No increased work of breathing. MSK:   Moves extremities without difficulty  Other:  Radial pulses intact and symmetric.    Medical Decision Making  Medically screening exam initiated at 5:37 PM.  Appropriate orders placed.  Andrea Pittman was informed that the remainder of the evaluation will be completed by another provider, this initial triage assessment does not replace that evaluation, and the importance of remaining in the ED until their evaluation is complete.    Corena Herter, PA-C 08/26/20 1737    Lennice Sites, DO 08/26/20 2059

## 2020-08-26 NOTE — ED Triage Notes (Signed)
Pt c/o centralized CP/epigastric pain for most of the day, endorses nausea, dizziness, diaphoresis. Denies cardiac hx. Hx acid reflux, antiemetic medication did not help.  States pain is worse w inspiration, describes as "sharp 8/10"

## 2020-08-26 NOTE — Discharge Instructions (Addendum)
You came to the emergency department today to be evaluated for your chest pain.  Your EKG, lab work, chest x-ray were reassuring you are not having acute heart attack today.  The CT scan of your chest showed no problems with your aorta or other acute abnormalities in your chest, abdomen, or pelvis.  Please follow-up with your primary care provider for reassessment.    Your symptoms may be due to peptic ulcer disease.  Due to this I will give you prescription for Mylanta.  You also need to follow-up with the [facility] gastroenterology.  I have also given you prescription for Zofran for any nausea that you are experiencing.  You may take 1 tablet every 8 hours as needed for nausea and vomiting  Get help right away if: Your chest pain gets worse. You have a cough that gets worse, or you cough up blood. You have severe pain in your abdomen. You faint. You have sudden, unexplained chest discomfort. You have sudden, unexplained discomfort in your arms, back, neck, or jaw. You have shortness of breath at any time. You suddenly start to sweat, or your skin gets clammy. You feel nausea or you vomit. You suddenly feel lightheaded or dizzy. You have severe weakness, or unexplained weakness or fatigue. Your heart begins to beat quickly, or it feels like it is skipping beats.

## 2020-08-31 ENCOUNTER — Other Ambulatory Visit: Payer: Self-pay

## 2020-08-31 ENCOUNTER — Encounter: Payer: Self-pay | Admitting: Family Medicine

## 2020-08-31 ENCOUNTER — Ambulatory Visit (INDEPENDENT_AMBULATORY_CARE_PROVIDER_SITE_OTHER): Payer: Medicare Other | Admitting: Family Medicine

## 2020-08-31 VITALS — BP 140/80 | HR 85 | Ht 64.0 in | Wt 192.0 lb

## 2020-08-31 DIAGNOSIS — R7989 Other specified abnormal findings of blood chemistry: Secondary | ICD-10-CM

## 2020-08-31 DIAGNOSIS — K219 Gastro-esophageal reflux disease without esophagitis: Secondary | ICD-10-CM

## 2020-08-31 DIAGNOSIS — R079 Chest pain, unspecified: Secondary | ICD-10-CM

## 2020-08-31 NOTE — Progress Notes (Signed)
BP 140/80   Pulse 85   Ht '5\' 4"'  (1.626 m)   Wt 192 lb (87.1 kg)   SpO2 96%   BMI 32.96 kg/m    Subjective:   Patient ID: Andrea Pittman, female    DOB: [birth year], 65 y.o.   MRN: [id number]  HPI: Andrea Pittman is a 65 y.o. female presenting on 08/31/2020 for ER follow up (Had chest pain, sweating, nausea. )   HPI Patient was in with chest pain and usually takes omeprazole and went away and then she develops sweating and dizziness. It did go away after 30 minutes, returned 3 days later.  She felt sick and sweats and pain in abdomen and went through to back. She went to ED that day.  She was given maalox and nitro and asa and fentanyl it improved slightly. They recommended a GI referral.  The pain is gone today and has not flared up since.  Belching usually makes better. Patient did have elevated lfts.  Relevant past medical, surgical, family and social history reviewed and updated as indicated. Interim medical history since our last visit reviewed. Allergies and medications reviewed and updated.  Review of Systems  Constitutional:  Negative for chills and fever.  Respiratory:  Negative for chest tightness and shortness of breath.   Cardiovascular:  Negative for chest pain and leg swelling.  Gastrointestinal:  Positive for abdominal pain. Negative for abdominal distention, anal bleeding, diarrhea, nausea and vomiting.  Musculoskeletal:  Negative for back pain and gait problem.  Skin:  Negative for rash.  Neurological:  Negative for light-headedness and headaches.  Psychiatric/Behavioral:  Negative for agitation and behavioral problems.   All other systems reviewed and are negative.  Per HPI unless specifically indicated above   Allergies as of 08/31/2020   No Known Allergies      Medication List        Accurate as of August 31, 2020 12:00 PM. If you have any questions, ask your nurse or doctor.          alum & mag hydroxide-simeth [phone] MG/5ML suspension Commonly  known as: Mylanta Maximum Strength Take 15 mLs by mouth every 6 (six) hours as needed for indigestion.   cholecalciferol 1000 units tablet Commonly known as: VITAMIN D Take 2,000 Units by mouth daily.   DULoxetine 60 MG capsule Commonly known as: CYMBALTA TAKE TWO CAPSULES EACH DAY   estradiol 1 MG tablet Commonly known as: ESTRACE Take 1 tablet (1 mg total) by mouth daily.   omeprazole 40 MG capsule Commonly known as: PRILOSEC Take 1 capsule (40 mg total) by mouth daily.   ondansetron 4 MG disintegrating tablet Commonly known as: Zofran ODT Take 1 tablet (4 mg total) by mouth every 8 (eight) hours as needed for nausea or vomiting.   pregabalin 100 MG capsule Commonly known as: Lyrica Take 1 capsule (100 mg total) by mouth 2 (two) times daily.         Objective:   BP 140/80   Pulse 85   Ht '5\' 4"'  (1.626 m)   Wt 192 lb (87.1 kg)   SpO2 96%   BMI 32.96 kg/m   Wt Readings from Last 3 Encounters:  08/31/20 192 lb (87.1 kg)  05/02/20 190 lb 6.4 oz (86.4 kg)  02/19/20 188 lb (85.3 kg)    Physical Exam Vitals and nursing note reviewed.  Constitutional:      General: She is not in acute distress.    Appearance: She is well-developed.  She is not diaphoretic.  Eyes:     Conjunctiva/sclera: Conjunctivae normal.  Cardiovascular:     Rate and Rhythm: Normal rate and regular rhythm.     Heart sounds: Normal heart sounds. No murmur heard. Pulmonary:     Effort: Pulmonary effort is normal. No respiratory distress.     Breath sounds: Normal breath sounds. No wheezing.  Abdominal:     General: Abdomen is flat. Bowel sounds are normal. There is no distension.     Tenderness: There is no abdominal tenderness. There is no right CVA tenderness, guarding or rebound.  Skin:    General: Skin is warm and dry.     Findings: No rash.  Neurological:     Mental Status: She is alert and oriented to person, place, and time.     Coordination: Coordination normal.  Psychiatric:         Behavior: Behavior normal.      Assessment & Plan:   Problem List Items Addressed This Visit       Digestive   GERD (gastroesophageal reflux disease) - Primary   Relevant Orders   Ambulatory referral to Gastroenterology   Other Visit Diagnoses     Chest pain, unspecified type       Relevant Orders   Ambulatory referral to Gastroenterology   CBC with Differential/Platelet   CMP14+EGFR   Elevated LFTs       Relevant Orders   CBC with Differential/Platelet   CMP14+EGFR       Recheck LFTs and refer to GI Follow up plan: Return in about 6 months (around 03/02/2021), or if symptoms worsen or fail to improve, for gerd.  Counseling provided for all of the vaccine components Orders Placed This Encounter  Procedures   CBC with Differential/Platelet   CMP14+EGFR   Ambulatory referral to Gastroenterology    Caryl Pina, MD [facility] 08/31/2020, 12:00 PM

## 2020-09-01 LAB — CMP14+EGFR
ALT: 41 IU/L — ABNORMAL HIGH (ref 0–32)
AST: 33 IU/L (ref 0–40)
Albumin/Globulin Ratio: 1.3 (ref 1.2–2.2)
Albumin: 3.9 g/dL (ref 3.8–4.8)
Alkaline Phosphatase: 94 IU/L (ref 44–121)
BUN/Creatinine Ratio: 14 (ref 12–28)
BUN: 12 mg/dL (ref 8–27)
Bilirubin Total: 0.3 mg/dL (ref 0.0–1.2)
CO2: 24 mmol/L (ref 20–29)
Calcium: 9.1 mg/dL (ref 8.7–10.3)
Chloride: 101 mmol/L (ref 96–106)
Creatinine, Ser: 0.84 mg/dL (ref 0.57–1.00)
Globulin, Total: 3.1 g/dL (ref 1.5–4.5)
Glucose: 117 mg/dL — ABNORMAL HIGH (ref 65–99)
Potassium: 4 mmol/L (ref 3.5–5.2)
Sodium: 140 mmol/L (ref 134–144)
Total Protein: 7 g/dL (ref 6.0–8.5)
eGFR: 77 mL/min/{1.73_m2} (ref >59)

## 2020-09-01 LAB — CBC WITH DIFFERENTIAL/PLATELET
Basophils Absolute: 0.1 10*3/uL (ref 0.0–0.2)
Basos: 1 %
EOS (ABSOLUTE): 0.7 10*3/uL — ABNORMAL HIGH (ref 0.0–0.4)
Eos: 7 %
Hematocrit: 41.3 % (ref 34.0–46.6)
Hemoglobin: 13.5 g/dL (ref 11.1–15.9)
Immature Grans (Abs): 0.1 10*3/uL (ref 0.0–0.1)
Immature Granulocytes: 1 %
Lymphocytes Absolute: 4 10*3/uL — ABNORMAL HIGH (ref 0.7–3.1)
Lymphs: 37 %
MCH: 29.3 pg (ref 26.6–33.0)
MCHC: 32.7 g/dL (ref 31.5–35.7)
MCV: 90 fL (ref 79–97)
Monocytes Absolute: 0.8 10*3/uL (ref 0.1–0.9)
Monocytes: 7 %
Neutrophils Absolute: 5.1 10*3/uL (ref 1.4–7.0)
Neutrophils: 47 %
Platelets: 400 10*3/uL (ref 150–450)
RBC: 4.61 x10E6/uL (ref 3.77–5.28)
RDW: 13.2 % (ref 11.7–15.4)
WBC: 10.7 10*3/uL (ref 3.4–10.8)

## 2020-09-19 ENCOUNTER — Emergency Department (HOSPITAL_COMMUNITY): Payer: Medicare Other

## 2020-09-19 ENCOUNTER — Inpatient Hospital Stay (HOSPITAL_COMMUNITY)
Admission: EM | Admit: 2020-09-19 | Discharge: 2020-09-23 | DRG: 419 | Disposition: A | Discharge status: Home / Self Care | Payer: Medicare Other | Attending: Internal Medicine | Admitting: Internal Medicine

## 2020-09-19 ENCOUNTER — Encounter (HOSPITAL_COMMUNITY): Payer: Self-pay | Admitting: Emergency Medicine

## 2020-09-19 ENCOUNTER — Other Ambulatory Visit: Payer: Self-pay

## 2020-09-19 ENCOUNTER — Ambulatory Visit (INDEPENDENT_AMBULATORY_CARE_PROVIDER_SITE_OTHER): Payer: Medicare Other | Admitting: Family Medicine

## 2020-09-19 ENCOUNTER — Encounter: Payer: Self-pay | Admitting: Family Medicine

## 2020-09-19 VITALS — BP 164/97 | HR 96 | Temp 97.2°F | Resp 20 | Ht 64.0 in | Wt 189.0 lb

## 2020-09-19 DIAGNOSIS — K8021 Calculus of gallbladder without cholecystitis with obstruction: Secondary | ICD-10-CM | POA: Diagnosis not present

## 2020-09-19 DIAGNOSIS — F419 Anxiety disorder, unspecified: Secondary | ICD-10-CM | POA: Diagnosis present

## 2020-09-19 DIAGNOSIS — K8020 Calculus of gallbladder without cholecystitis without obstruction: Secondary | ICD-10-CM | POA: Diagnosis present

## 2020-09-19 DIAGNOSIS — E66811 Obesity, class 1: Secondary | ICD-10-CM | POA: Diagnosis present

## 2020-09-19 DIAGNOSIS — F339 Major depressive disorder, recurrent, unspecified: Secondary | ICD-10-CM | POA: Diagnosis present

## 2020-09-19 DIAGNOSIS — E669 Obesity, unspecified: Secondary | ICD-10-CM | POA: Diagnosis not present

## 2020-09-19 DIAGNOSIS — K8050 Calculus of bile duct without cholangitis or cholecystitis without obstruction: Secondary | ICD-10-CM | POA: Diagnosis present

## 2020-09-19 DIAGNOSIS — K8064 Calculus of gallbladder and bile duct with chronic cholecystitis without obstruction: Secondary | ICD-10-CM | POA: Diagnosis not present

## 2020-09-19 DIAGNOSIS — Z79818 Long term (current) use of other agents affecting estrogen receptors and estrogen levels: Secondary | ICD-10-CM

## 2020-09-19 DIAGNOSIS — R1010 Upper abdominal pain, unspecified: Secondary | ICD-10-CM

## 2020-09-19 DIAGNOSIS — K219 Gastro-esophageal reflux disease without esophagitis: Secondary | ICD-10-CM | POA: Diagnosis present

## 2020-09-19 DIAGNOSIS — E876 Hypokalemia: Secondary | ICD-10-CM | POA: Diagnosis not present

## 2020-09-19 DIAGNOSIS — R1011 Right upper quadrant pain: Secondary | ICD-10-CM

## 2020-09-19 DIAGNOSIS — G609 Hereditary and idiopathic neuropathy, unspecified: Secondary | ICD-10-CM | POA: Diagnosis present

## 2020-09-19 DIAGNOSIS — Z6832 Body mass index (BMI) 32.0-32.9, adult: Secondary | ICD-10-CM | POA: Diagnosis not present

## 2020-09-19 DIAGNOSIS — K660 Peritoneal adhesions (postprocedural) (postinfection): Secondary | ICD-10-CM | POA: Diagnosis not present

## 2020-09-19 DIAGNOSIS — Z79899 Other long term (current) drug therapy: Secondary | ICD-10-CM | POA: Diagnosis not present

## 2020-09-19 DIAGNOSIS — D72829 Elevated white blood cell count, unspecified: Secondary | ICD-10-CM | POA: Diagnosis present

## 2020-09-19 DIAGNOSIS — Z20822 Contact with and (suspected) exposure to covid-19: Secondary | ICD-10-CM | POA: Diagnosis not present

## 2020-09-19 DIAGNOSIS — K8051 Calculus of bile duct without cholangitis or cholecystitis with obstruction: Secondary | ICD-10-CM | POA: Diagnosis not present

## 2020-09-19 DIAGNOSIS — K8070 Calculus of gallbladder and bile duct without cholecystitis without obstruction: Secondary | ICD-10-CM | POA: Diagnosis not present

## 2020-09-19 DIAGNOSIS — K8010 Calculus of gallbladder with chronic cholecystitis without obstruction: Secondary | ICD-10-CM | POA: Diagnosis not present

## 2020-09-19 DIAGNOSIS — R519 Headache, unspecified: Secondary | ICD-10-CM | POA: Diagnosis not present

## 2020-09-19 DIAGNOSIS — R109 Unspecified abdominal pain: Secondary | ICD-10-CM | POA: Diagnosis not present

## 2020-09-19 DIAGNOSIS — Z419 Encounter for procedure for purposes other than remedying health state, unspecified: Secondary | ICD-10-CM

## 2020-09-19 DIAGNOSIS — K760 Fatty (change of) liver, not elsewhere classified: Secondary | ICD-10-CM | POA: Diagnosis present

## 2020-09-19 DIAGNOSIS — R7401 Elevation of levels of liver transaminase levels: Secondary | ICD-10-CM | POA: Diagnosis present

## 2020-09-19 DIAGNOSIS — F32A Depression, unspecified: Secondary | ICD-10-CM | POA: Diagnosis not present

## 2020-09-19 LAB — COMPREHENSIVE METABOLIC PANEL
ALT: 228 U/L — ABNORMAL HIGH (ref 0–44)
AST: 251 U/L — ABNORMAL HIGH (ref 15–41)
Albumin: 3.9 g/dL (ref 3.5–5.0)
Alkaline Phosphatase: 132 U/L — ABNORMAL HIGH (ref 38–126)
Anion gap: 10 (ref 5–15)
BUN: 8 mg/dL (ref 8–23)
CO2: 27 mmol/L (ref 22–32)
Calcium: 8.9 mg/dL (ref 8.9–10.3)
Chloride: 97 mmol/L — ABNORMAL LOW (ref 98–111)
Creatinine, Ser: 0.83 mg/dL (ref 0.44–1.00)
GFR, Estimated: >60 mL/min (ref >60)
Glucose, Bld: 169 mg/dL — ABNORMAL HIGH (ref 70–99)
Potassium: 3.4 mmol/L — ABNORMAL LOW (ref 3.5–5.1)
Sodium: 134 mmol/L — ABNORMAL LOW (ref 135–145)
Total Bilirubin: 3.6 mg/dL — ABNORMAL HIGH (ref 0.3–1.2)
Total Protein: 8.3 g/dL — ABNORMAL HIGH (ref 6.5–8.1)

## 2020-09-19 LAB — URINALYSIS, ROUTINE W REFLEX MICROSCOPIC
Glucose, UA: NEGATIVE mg/dL
Ketones, ur: 5 mg/dL — AB
Nitrite: NEGATIVE
Protein, ur: 30 mg/dL — AB
Specific Gravity, Urine: 1.015 (ref 1.005–1.030)
pH: 6 (ref 5.0–8.0)

## 2020-09-19 LAB — CBC WITH DIFFERENTIAL/PLATELET
Abs Immature Granulocytes: 0.06 10*3/uL (ref 0.00–0.07)
Basophils Absolute: 0.1 10*3/uL (ref 0.0–0.1)
Basophils Relative: 1 %
Eosinophils Absolute: 0.1 10*3/uL (ref 0.0–0.5)
Eosinophils Relative: 1 %
HCT: 44.8 % (ref 36.0–46.0)
Hemoglobin: 14.8 g/dL (ref 12.0–15.0)
Immature Granulocytes: 1 %
Lymphocytes Relative: 15 %
Lymphs Abs: 2 10*3/uL (ref 0.7–4.0)
MCH: 29.8 pg (ref 26.0–34.0)
MCHC: 33 g/dL (ref 30.0–36.0)
MCV: 90.3 fL (ref 80.0–100.0)
Monocytes Absolute: 0.9 10*3/uL (ref 0.1–1.0)
Monocytes Relative: 7 %
Neutro Abs: 10.2 10*3/uL — ABNORMAL HIGH (ref 1.7–7.7)
Neutrophils Relative %: 75 %
Platelets: 419 10*3/uL — ABNORMAL HIGH (ref 150–400)
RBC: 4.96 MIL/uL (ref 3.87–5.11)
RDW: 13.4 % (ref 11.5–15.5)
WBC: 13.3 10*3/uL — ABNORMAL HIGH (ref 4.0–10.5)
nRBC: 0 % (ref 0.0–0.2)

## 2020-09-19 LAB — LIPASE, BLOOD: Lipase: 29 U/L (ref 11–51)

## 2020-09-19 MED ORDER — ONDANSETRON 8 MG PO TBDP: 8.0000 mg | ORAL_TABLET | Freq: Three times a day (TID) | ORAL | 0 refills | Status: DC | PRN

## 2020-09-19 MED ORDER — SUCRALFATE 1 G PO TABS: 1.0000 g | ORAL_TABLET | Freq: Three times a day (TID) | ORAL | 0 refills | Status: DC

## 2020-09-19 MED ORDER — FAMOTIDINE 40 MG PO TABS: 40.0000 mg | ORAL_TABLET | Freq: Every day | ORAL | 0 refills | Status: DC

## 2020-09-19 MED ORDER — PANTOPRAZOLE SODIUM 40 MG PO TBEC: 40.0000 mg | DELAYED_RELEASE_TABLET | Freq: Every day | ORAL | 3 refills | Status: DC

## 2020-09-19 NOTE — Progress Notes (Signed)
Acute Office Visit  Subjective:    Patient ID: Andrea Pittman, female    DOB: [birth year], 65 y.o.   MRN: [id number]  Chief Complaint  Patient presents with   Abdominal Pain    HPI Patient is in today for epigastric abdominal pain. She was seen in the ED on 6/24 and had a negative cardiac work up and unremarkable chest/abdomen/pelvis CT. She has been taking prilosec and maalox which has been helpful until a few days ago. She reports pain at the top of her abdomen and goes under her breast for the last 3 days. The pain is constant. The pain is sharp. The pain is a 10/10. Nothing makes the pain better. The pain is worse with movement. She had an egg sandwich last night, this did not make the pain worse. She has not eaten today. She has been able to keep down fluids. She denies vomiting. She is nauseous. Denies fever or diarrhea.    Past Medical History:  Diagnosis Date   Anxiety    Depression    GERD (gastroesophageal reflux disease)     Past Surgical History:  Procedure Laterality Date   ABDOMINAL HYSTERECTOMY     BILATERAL CARPAL TUNNEL RELEASE      Family History  Problem Relation Age of Onset   Cancer Mother     Social History   Socioeconomic History   Marital status: Married    Spouse name: Not on file   Number of children: Not on file   Years of education: Not on file   Highest education level: Not on file  Occupational History   Not on file  Tobacco Use   Smoking status: Never   Smokeless tobacco: Never  Vaping Use   Vaping Use: Never used  Substance and Sexual Activity   Alcohol use: No   Drug use: No   Sexual activity: Not on file  Other Topics Concern   Not on file  Social History Narrative   She does not work and watches her grandson.    Right handed    Social Determinants of Health   Financial Resource Strain: Not on file  Food Insecurity: Not on file  Transportation Needs: Not on file  Physical Activity: Not on file  Stress: Not on file   Social Connections: Not on file  Intimate Partner Violence: Not on file    Outpatient Medications Prior to Visit  Medication Sig Dispense Refill   alum & mag hydroxide-simeth (MYLANTA MAXIMUM STRENGTH) 400-400-40 MG/5ML suspension Take 15 mLs by mouth every 6 (six) hours as needed for indigestion. 355 mL 0   cholecalciferol (VITAMIN D) 1000 UNITS tablet Take 2,000 Units by mouth daily.     DULoxetine (CYMBALTA) 60 MG capsule TAKE TWO CAPSULES EACH DAY 90 capsule 3   estradiol (ESTRACE) 1 MG tablet Take 1 tablet (1 mg total) by mouth daily. 90 tablet 3   omeprazole (PRILOSEC) 40 MG capsule Take 1 capsule (40 mg total) by mouth daily. 90 capsule 3   ondansetron (ZOFRAN ODT) 4 MG disintegrating tablet Take 1 tablet (4 mg total) by mouth every 8 (eight) hours as needed for nausea or vomiting. 20 tablet 0   pregabalin (LYRICA) 100 MG capsule Take 1 capsule (100 mg total) by mouth 2 (two) times daily. 60 capsule 5   No facility-administered medications prior to visit.    No Known Allergies  Review of Systems As per HPI.     Objective:    Physical Exam Vitals  and nursing note reviewed.  Constitutional:      Appearance: She is not toxic-appearing or diaphoretic.  Cardiovascular:     Rate and Rhythm: Normal rate and regular rhythm.  Pulmonary:     Effort: Pulmonary effort is normal. No respiratory distress.     Breath sounds: Normal breath sounds.  Abdominal:     General: Bowel sounds are normal. There is no distension.     Palpations: Abdomen is soft. There is no shifting dullness, fluid wave, hepatomegaly or mass.     Tenderness: There is abdominal tenderness in the right upper quadrant, epigastric area and left upper quadrant. There is no guarding or rebound. Negative signs include Murphy's sign, Rovsing's sign, McBurney's sign and psoas sign.     Hernia: No hernia is present.  Skin:    General: Skin is warm and dry.  Neurological:     General: No focal deficit present.      Mental Status: She is alert and oriented to person, place, and time.  Psychiatric:        Mood and Affect: Mood normal.        Behavior: Behavior normal.    BP (!) 164/97   Pulse 96   Temp (!) 97.2 F (36.2 C) (Temporal)   Resp 20   Ht _0  (1.626 m)   Wt 189 lb (85.7 kg)   SpO2 97%   BMI 32.44 kg/m  Wt Readings from Last 3 Encounters:  09/19/20 189 lb (85.7 kg)  08/31/20 192 lb (87.1 kg)  05/02/20 190 lb 6.4 oz (86.4 kg)    Health Maintenance Due  Topic Date Due   COVID-19 Vaccine (1) Never done   HIV Screening  Never done   Hepatitis C Screening  Never done   Zoster Vaccines- Shingrix (1 of 2) Never done   COLONOSCOPY (Pts 45-47yr Insurance coverage will need to be confirmed)  03/05/2017   MAMMOGRAM  12/03/2019   DEXA SCAN  Never done   TETANUS/TDAP  07/09/2020   PAP SMEAR-Modifier  10/17/2020    There are no preventive care reminders to display for this patient.   Lab Results  Component Value Date   TSH 1.160 05/28/2019   Lab Results  Component Value Date   WBC 10.7 08/31/2020   HGB 13.5 08/31/2020   HCT 41.3 08/31/2020   MCV 90 08/31/2020   PLT 400 08/31/2020   Lab Results  Component Value Date   NA 140 08/31/2020   K 4.0 08/31/2020   CO2 24 08/31/2020   GLUCOSE 117 (H) 08/31/2020   BUN 12 08/31/2020   CREATININE 0.84 08/31/2020   BILITOT 0.3 08/31/2020   ALKPHOS 94 08/31/2020   AST 33 08/31/2020   ALT 41 (H) 08/31/2020   PROT 7.0 08/31/2020   ALBUMIN 3.9 08/31/2020   CALCIUM 9.1 08/31/2020   ANIONGAP 10 08/26/2020   EGFR 77 08/31/2020   Lab Results  Component Value Date   CHOL 177 05/28/2019   Lab Results  Component Value Date   HDL 36 (L) 05/28/2019   Lab Results  Component Value Date   LDLCALC 82 05/28/2019   Lab Results  Component Value Date   TRIG 364 (H) 05/28/2019   Lab Results  Component Value Date   CHOLHDL 4.9 (H) 05/28/2019   No results found for: HGBA1C     Assessment & Plan:   JOleviawas seen today for  abdominal pain.  Diagnoses and all orders for this visit:  Gastroesophageal reflux disease without  esophagitis Not well controlled. Change from prilosec to protonix. Zofran as need for nausea. Add pepcid and carafate. Discussed diet, hydration, and return precautions.  -     pantoprazole (PROTONIX) 40 MG tablet; Take 1 tablet (40 mg total) by mouth daily. -     sucralfate (CARAFATE) 1 g tablet; Take 1 tablet (1 g total) by mouth 4 (four) times daily -  with meals and at bedtime. -     ondansetron (ZOFRAN ODT) 8 MG disintegrating tablet; Take 1 tablet (8 mg total) by mouth every 8 (eight) hours as needed for nausea or vomiting. -     famotidine (PEPCID) 40 MG tablet; Take 1 tablet (40 mg total) by mouth daily.  Return to office for new or worsening symptoms, or if symptoms persist.   The patient indicates understanding of these issues and agrees with the plan.  Gwenlyn Perking, FNP

## 2020-09-19 NOTE — Patient Instructions (Signed)
[employer] Current Therapy 2021 (pp. 213-216). [place], [place]: Elsevier.">  Gastroesophageal Reflux Disease, Adult Gastroesophageal reflux (GER) happens when acid from the stomach flows up into the tube that connects the mouth and the stomach (esophagus). Normally, food travels down the esophagus and stays in the stomach to be digested. However, when a person has GER, food and stomach acid sometimes move back up into the esophagus. If this becomes a more serious problem, the person may be diagnosed with a disease called gastroesophageal reflux disease (GERD). GERD occurs when the reflux: Happens often. Causes frequent or severe symptoms. Causes problems such as damage to the esophagus. When stomach acid comes in contact with the esophagus, the acid may cause inflammation in the esophagus. Over time, GERD may create small holes (ulcers) in the lining of the esophagus. What are the causes? This condition is caused by a problem with the muscle between the esophagus and the stomach (lower esophageal sphincter, or LES). Normally, the LES muscle closes after food passes through the esophagus to the stomach. When the LES is weakened or abnormal, it does not close properly, and that allows food and stomach acid to go back up into theesophagus. The LES can be weakened by certain dietary substances, medicines, and medical conditions, including: Tobacco use. Pregnancy. Having a hiatal hernia. Alcohol use. Certain foods and beverages, such as coffee, chocolate, onions, and peppermint. What increases the risk? You are more likely to develop this condition if you: Have an increased body weight. Have a connective tissue disorder. Take NSAIDs, such as ibuprofen. What are the signs or symptoms? Symptoms of this condition include: Heartburn. Difficult or painful swallowing and the feeling of having a lump in the throat. A bitter taste in the mouth. Bad breath and having a large amount of saliva. Having an  upset or bloated stomach and belching. Chest pain. Different conditions can cause chest pain. Make sure you see your health care provider if you experience chest pain. Shortness of breath or wheezing. Ongoing (chronic) cough or a nighttime cough. Wearing away of tooth enamel. Weight loss. How is this diagnosed? This condition may be diagnosed based on a medical history and a physical exam. To determine if you have mild or severe GERD, your health care provider may also monitor how you respond to treatment. You may also have tests, including: A test to examine your stomach and esophagus with a small camera (endoscopy). A test that measures the acidity level in your esophagus. A test that measures how much pressure is on your esophagus. A barium swallow or modified barium swallow test to show the shape, size, and functioning of your esophagus. How is this treated? Treatment for this condition may vary depending on how severe your symptoms are. Your health care provider may recommend: Changes to your diet. Medicine. Surgery. The goal of treatment is to help relieve your symptoms and to preventcomplications. Follow these instructions at home: Eating and drinking  Follow a diet as recommended by your health care provider. This may involve avoiding foods and drinks such as: Coffee and tea, with or without caffeine. Drinks that contain alcohol. Energy drinks and sports drinks. Carbonated drinks or sodas. Chocolate and cocoa. Peppermint and mint flavorings. Garlic and onions. Horseradish. Spicy and acidic foods, including peppers, chili powder, curry powder, vinegar, hot sauces, and barbecue sauce. Citrus fruit juices and citrus fruits, such as oranges, lemons, and limes. Tomato-based foods, such as red sauce, chili, salsa, and pizza with red sauce. Fried and fatty foods, such as   donuts, french fries, potato chips, and high-fat dressings. High-fat meats, such as hot dogs and fatty cuts of  red and white meats, such as rib eye steak, sausage, ham, and bacon. High-fat dairy items, such as whole milk, butter, and cream cheese. Eat small, frequent meals instead of large meals. Avoid drinking large amounts of liquid with your meals. Avoid eating meals during the 2-3 hours before bedtime. Avoid lying down right after you eat. Do not exercise right after you eat.  Lifestyle  Do not use any products that contain nicotine or tobacco. These products include cigarettes, chewing tobacco, and vaping devices, such as e-cigarettes. If you need help quitting, ask your health care provider. Try to reduce your stress by using methods such as yoga or meditation. If you need help reducing stress, ask your health care provider. If you are overweight, reduce your weight to an amount that is healthy for you. Ask your health care provider for guidance about a safe weight loss goal.  General instructions Pay attention to any changes in your symptoms. Take over-the-counter and prescription medicines only as told by your health care provider. Do not take aspirin, ibuprofen, or other NSAIDs unless your health care provider told you to take these medicines. Wear loose-fitting clothing. Do not wear anything tight around your waist that causes pressure on your abdomen. Raise (elevate) the head of your bed about 6 inches (15 cm). You can use a wedge to do this. Avoid bending over if this makes your symptoms worse. Keep all follow-up visits. This is important. Contact a health care provider if: You have: New symptoms. Unexplained weight loss. Difficulty swallowing or it hurts to swallow. Wheezing or a persistent cough. A hoarse voice. Your symptoms do not improve with treatment. Get help right away if: You have sudden pain in your arms, neck, jaw, teeth, or back. You suddenly feel sweaty, dizzy, or light-headed. You have chest pain or shortness of breath. You vomit and the vomit is green, yellow, or  black, or it looks like blood or coffee grounds. You faint. You have stool that is red, bloody, or black. You cannot swallow, drink, or eat. These symptoms may represent a serious problem that is an emergency. Do not wait to see if the symptoms will go away. Get medical help right away. Call your local emergency services (911 in the [place] Do not drive yourself to the hospital. Summary Gastroesophageal reflux happens when acid from the stomach flows up into the esophagus. GERD is a disease in which the reflux happens often, causes frequent or severe symptoms, or causes problems such as damage to the esophagus. Treatment for this condition may vary depending on how severe your symptoms are. Your health care provider may recommend diet and lifestyle changes, medicine, or surgery. Contact a health care provider if you have new or worsening symptoms. Take over-the-counter and prescription medicines only as told by your health care provider. Do not take aspirin, ibuprofen, or other NSAIDs unless your health care provider told you to do so. Keep all follow-up visits as told by your health care provider. This is important. This information is not intended to replace advice given to you by your health care provider. Make sure you discuss any questions you have with your healthcare provider. Document Revised: 08/31/2019 Document Reviewed: 08/31/2019 Elsevier Patient Education  2022 [employer] Inc.  

## 2020-09-19 NOTE — ED Triage Notes (Signed)
Patient reports persistent pain across her upper abdomen onset last Saturday with nausea and emesis , no diarrhea , denies fever or chills, seen by her PCP today diagnosed with GERD with prescriptions .

## 2020-09-19 NOTE — ED Provider Notes (Signed)
Emergency Medicine Provider Triage Evaluation Note  Andrea Pittman , a 65 y.o. female  was evaluated in triage.  Pt complains of epigastric abdominal pain x2 days associated with nausea and vomiting. No chest pain or SOB. Denies excessive alcohol use or chronic NSAIDs. No previous abdominal operations. Seen by PCP earlier today for possible GERD.  Review of Systems  Positive: Abdominal pain, n/v Negative: CP  Physical Exam  BP (!) 179/103 (BP Location: Left Arm)   Pulse 93   Temp 97.9 F (36.6 C) (Oral)   Resp 18   SpO2 96%  Gen:   Awake, no distress   Resp:  Normal effort  MSK:   Moves extremities without difficulty  Other:  TTP in epigastrium and RUQ  Medical Decision Making  Medically screening exam initiated at 9:45 PM.  Appropriate orders placed.  SHUNTEL FISHBURN was informed that the remainder of the evaluation will be completed by another provider, this initial triage assessment does not replace that evaluation, and the importance of remaining in the ED until their evaluation is complete.  RUQ [place] labs   Karie Kirks 09/19/20 2147    Pattricia Boss, MD 09/20/20 [phone]

## 2020-09-20 ENCOUNTER — Observation Stay (HOSPITAL_COMMUNITY): Payer: Medicare Other

## 2020-09-20 ENCOUNTER — Encounter (HOSPITAL_COMMUNITY): Payer: Self-pay | Admitting: Internal Medicine

## 2020-09-20 DIAGNOSIS — F419 Anxiety disorder, unspecified: Secondary | ICD-10-CM

## 2020-09-20 DIAGNOSIS — K8021 Calculus of gallbladder without cholecystitis with obstruction: Secondary | ICD-10-CM | POA: Diagnosis not present

## 2020-09-20 DIAGNOSIS — F339 Major depressive disorder, recurrent, unspecified: Secondary | ICD-10-CM | POA: Diagnosis present

## 2020-09-20 DIAGNOSIS — K219 Gastro-esophageal reflux disease without esophagitis: Secondary | ICD-10-CM | POA: Diagnosis not present

## 2020-09-20 DIAGNOSIS — K8020 Calculus of gallbladder without cholecystitis without obstruction: Secondary | ICD-10-CM | POA: Diagnosis present

## 2020-09-20 DIAGNOSIS — E876 Hypokalemia: Secondary | ICD-10-CM | POA: Diagnosis present

## 2020-09-20 DIAGNOSIS — D72829 Elevated white blood cell count, unspecified: Secondary | ICD-10-CM | POA: Diagnosis present

## 2020-09-20 DIAGNOSIS — K8050 Calculus of bile duct without cholangitis or cholecystitis without obstruction: Secondary | ICD-10-CM | POA: Diagnosis not present

## 2020-09-20 DIAGNOSIS — R7401 Elevation of levels of liver transaminase levels: Secondary | ICD-10-CM | POA: Diagnosis present

## 2020-09-20 DIAGNOSIS — E669 Obesity, unspecified: Secondary | ICD-10-CM

## 2020-09-20 DIAGNOSIS — F32A Depression, unspecified: Secondary | ICD-10-CM

## 2020-09-20 LAB — RESP PANEL BY RT-PCR (FLU A&B, COVID) ARPGX2
Influenza A by PCR: NEGATIVE
Influenza B by PCR: NEGATIVE
SARS Coronavirus 2 by RT PCR: NEGATIVE

## 2020-09-20 MED ORDER — GADOBUTROL 1 MMOL/ML IV SOLN
8.5000 mL | Freq: Once | INTRAVENOUS | Status: AC | PRN
  Administered 2020-09-20: 8.5 mL via INTRAVENOUS

## 2020-09-20 MED ORDER — ONDANSETRON HCL 4 MG PO TABS: 4.0000 mg | ORAL_TABLET | Freq: Four times a day (QID) | ORAL | Status: DC | PRN

## 2020-09-20 MED ORDER — ENOXAPARIN SODIUM 40 MG/0.4ML IJ SOSY
40.0000 mg | PREFILLED_SYRINGE | INTRAMUSCULAR | Status: DC
  Administered 2020-09-20 – 2020-09-22 (×3): 40 mg via SUBCUTANEOUS
  Filled 2020-09-20 (×3): qty 0.4

## 2020-09-20 MED ORDER — SODIUM CHLORIDE 0.9 % IV BOLUS
1000.0000 mL | Freq: Once | INTRAVENOUS | Status: AC
  Administered 2020-09-20: 1000 mL via INTRAVENOUS

## 2020-09-20 MED ORDER — POTASSIUM CHLORIDE CRYS ER 20 MEQ PO TBCR
40.0000 meq | EXTENDED_RELEASE_TABLET | ORAL | Status: AC
  Administered 2020-09-20: 40 meq via ORAL
  Filled 2020-09-20: qty 2

## 2020-09-20 MED ORDER — PREGABALIN 100 MG PO CAPS
100.0000 mg | ORAL_CAPSULE | Freq: Two times a day (BID) | ORAL | Status: DC
  Administered 2020-09-20 – 2020-09-23 (×6): 100 mg via ORAL
  Filled 2020-09-20 (×6): qty 1

## 2020-09-20 MED ORDER — SODIUM CHLORIDE 0.9 % IV SOLN: INTRAVENOUS | Status: DC

## 2020-09-20 MED ORDER — SODIUM CHLORIDE 0.9% FLUSH
3.0000 mL | Freq: Two times a day (BID) | INTRAVENOUS | Status: DC
  Administered 2020-09-20 – 2020-09-22 (×3): 3 mL via INTRAVENOUS

## 2020-09-20 MED ORDER — FAMOTIDINE 20 MG PO TABS
40.0000 mg | ORAL_TABLET | Freq: Every day | ORAL | Status: DC
  Administered 2020-09-20 – 2020-09-23 (×4): 40 mg via ORAL
  Filled 2020-09-20 (×5): qty 2

## 2020-09-20 MED ORDER — SUCRALFATE 1 G PO TABS
1.0000 g | ORAL_TABLET | Freq: Three times a day (TID) | ORAL | Status: DC
  Administered 2020-09-20 – 2020-09-23 (×8): 1 g via ORAL
  Filled 2020-09-20 (×8): qty 1

## 2020-09-20 MED ORDER — DULOXETINE HCL 60 MG PO CPEP
120.0000 mg | ORAL_CAPSULE | Freq: Every day | ORAL | Status: DC
  Administered 2020-09-20 – 2020-09-23 (×4): 120 mg via ORAL
  Filled 2020-09-20 (×4): qty 2

## 2020-09-20 MED ORDER — ALBUTEROL SULFATE (2.5 MG/3ML) 0.083% IN NEBU: 2.5000 mg | INHALATION_SOLUTION | Freq: Four times a day (QID) | RESPIRATORY_TRACT | Status: DC | PRN

## 2020-09-20 MED ORDER — ONDANSETRON HCL 4 MG/2ML IJ SOLN
4.0000 mg | Freq: Four times a day (QID) | INTRAMUSCULAR | Status: DC | PRN
  Administered 2020-09-20: 4 mg via INTRAVENOUS
  Filled 2020-09-20: qty 2

## 2020-09-20 MED ORDER — KETOROLAC TROMETHAMINE 15 MG/ML IJ SOLN
15.0000 mg | Freq: Once | INTRAMUSCULAR | Status: AC
  Administered 2020-09-20: 15 mg via INTRAVENOUS
  Filled 2020-09-20: qty 1

## 2020-09-20 MED ORDER — PANTOPRAZOLE SODIUM 40 MG PO TBEC
40.0000 mg | DELAYED_RELEASE_TABLET | Freq: Every day | ORAL | Status: DC
  Administered 2020-09-21 – 2020-09-22 (×2): 40 mg via ORAL
  Filled 2020-09-20 (×2): qty 1

## 2020-09-20 NOTE — Progress Notes (Signed)
Pt c/o throbbing headache that is a 10/10 pain score and feeling nauseous. No pain meds ordered, providered paged. Awaiting new orders.

## 2020-09-20 NOTE — H&P (Addendum)
History and Physical    Andrea Pittman IDP:[id number] DOB: [birth year] DOA: 09/19/2020  Referring MD/NP/PA: Roderic Palau, MD PCP: Dettinger, [name], MD  Patient coming from: home   Chief Complaint: Abdominal pain  I have personally briefly reviewed patient's old medical records in [place]   HPI: Andrea Pittman is a 65 y.o. female with medical history significant of anxiety, depression, and GERD presents with complaints of epigastric abdominal pain.  Symptoms started 3 days ago after eating dinner.  She reports having a constant aching pain that was worse on the right upper quadrant.  Noted tenderness with palpation in this area.  Associated symptoms included nausea and vomiting.  Emesis was noted to be of stomach contents and nonbloody.  She only utilizes ibuprofen intermittently when she has a bad headache and not on the daily basis.  Patient reports she is 5 years overdue for repeat colonoscopy, but the previous one was reported to be normal.  Patient denies having any fever, shortness of breath, chest pain, dysuria, diarrhea, or blood in stools.  Patient had been seen in the emergency department on with complaints of chest pain 08/26/2020 and had a CT scan of the chest, abdomen, and pelvis which noted concern for hepatic steatosis without concerning findings for cholecystitis.  Patient reports she was discharged home with medication for acid reflux.  ED Course: Upon admission into the emergency department patient was seen to be afebrile, respirations 13-25, blood pressures elevated up to 179/103, and all other vital signs maintained.  Labs significant for WBC 13.3, Platelets 419, sodium 134, potassium 3.4, glucose 169, alkaline phosphatase 132, AST 251, ALT 228, total bilirubin 3.6.  Urinalysis was significant for small hemoglobin, trace leukocytes, rare bacteria, 11-20 squamous epithelial cells, and 6-10 WBCs.  Also ultrasound revealed cholelithiasis with no acute findings of cholecystitis and  stable enlarged common bile duct measuring up to 10 mm.  Gastroenterology have been consulted.  Patient had been given 1 L normal saline IV fluids.    Review of Systems  Constitutional:  Negative for fever.  HENT:  Negative for ear discharge.   Eyes:  Negative for photophobia and pain.  Respiratory:  Negative for cough and shortness of breath.   Cardiovascular:  Negative for chest pain and leg swelling.  Gastrointestinal:  Positive for abdominal pain, nausea and vomiting.  Genitourinary:  Negative for dysuria and hematuria.  Musculoskeletal:  Negative for falls.  Skin:  Negative for rash.  Neurological:  Positive for headaches. Negative for focal weakness and loss of consciousness.  Psychiatric/Behavioral:  Negative for substance abuse. The patient has insomnia.    Past Medical History:  Diagnosis Date   Anxiety    Depression    GERD (gastroesophageal reflux disease)     Past Surgical History:  Procedure Laterality Date   ABDOMINAL HYSTERECTOMY     BILATERAL CARPAL TUNNEL RELEASE       reports that she has never smoked. She has never used smokeless tobacco. She reports that she does not drink alcohol and does not use drugs.  No Known Allergies  Family History  Problem Relation Age of Onset   Cancer Mother     Prior to Admission medications   Medication Sig Start Date End Date Taking? Authorizing Provider  DULoxetine (CYMBALTA) 60 MG capsule TAKE TWO CAPSULES EACH DAY Patient taking differently: Take 120 mg by mouth daily. 02/19/20  Yes Dettinger, [name], MD  estradiol (ESTRACE) 2 MG tablet Take 1 mg by mouth daily. 08/03/20  Yes [provider]  famotidine (PEPCID) 40 MG tablet Take 1 tablet (40 mg total) by mouth daily. 09/19/20  Yes Gwenlyn Perking, FNP  ibuprofen (ADVIL) 200 MG tablet Take 400-600 mg by mouth every 6 (six) hours as needed for fever, headache or mild pain.   Yes [provider]  ondansetron (ZOFRAN ODT) 8 MG disintegrating tablet  Take 1 tablet (8 mg total) by mouth every 8 (eight) hours as needed for nausea or vomiting. 09/19/20  Yes Gwenlyn Perking, FNP  oxymetazoline (AFRIN) 0.05 % nasal spray Place 1-2 sprays into both nostrils 2 (two) times daily as needed for congestion.   Yes [provider]  pantoprazole (PROTONIX) 40 MG tablet Take 1 tablet (40 mg total) by mouth daily. 09/19/20  Yes Gwenlyn Perking, FNP  pregabalin (LYRICA) 100 MG capsule Take 1 capsule (100 mg total) by mouth 2 (two) times daily. 05/02/20  Yes Patel, Donika K, DO  sucralfate (CARAFATE) 1 g tablet Take 1 tablet (1 g total) by mouth 4 (four) times daily -  with meals and at bedtime. 09/19/20  Yes Gwenlyn Perking, FNP  alum & mag hydroxide-simeth [facility] MAXIMUM STRENGTH) 400-400-40 MG/5ML suspension Take 15 mLs by mouth every 6 (six) hours as needed for indigestion. Patient not taking: Reported on 09/20/2020 08/26/20   Loni Beckwith, PA-C  estradiol (ESTRACE) 1 MG tablet Take 1 tablet (1 mg total) by mouth daily. Patient not taking: Reported on 09/20/2020 02/19/20   Dettinger, [name], MD  omeprazole (PRILOSEC) 40 MG capsule Take 40 mg by mouth daily.    [provider]    Physical Exam:  Constitutional: Older female who appears to be in no acute distress Vitals:   09/20/20 1330 09/20/20 1345 09/20/20 1400 09/20/20 1415  BP: (!) 154/84 (!) 149/85 (!) 154/83 (!) 153/81  Pulse: 90 93 87 95  Resp: (!) 21 19 14 17   Temp:      TempSrc:      SpO2: 96% 96% 95% 96%   Eyes: PERRL, lids and conjunctivae normal ENMT: Mucous membranes are dry. Posterior pharynx clear of any exudate or lesions.  Neck: normal, supple, no masses, no thyromegaly Respiratory: clear to auscultation bilaterally, no wheezing, no crackles. Normal respiratory effort. No accessory muscle use.  Cardiovascular: Regular rate and rhythm, no murmurs / rubs / gallops. No extremity edema. 2+ pedal pulses. No carotid bruits.  Abdomen: no significant  tenderness, no masses palpated. No hepatosplenomegaly. Bowel sounds positive.  Musculoskeletal: no clubbing / cyanosis. No joint deformity upper and lower extremities. Good ROM, no contractures. Normal muscle tone.  Skin: no rashes, lesions, ulcers. No induration Neurologic: CN 2-12 grossly intact. Sensation intact, DTR normal. Strength 5/5 in all 4.  Psychiatric: Normal judgment and insight. Alert and oriented x 3. Normal mood.     Labs on Admission: I have personally reviewed following labs and imaging studies  CBC: Recent Labs  Lab 09/19/20 2151  WBC 13.3*  NEUTROABS 10.2*  HGB 14.8  HCT 44.8  MCV 90.3  PLT 465*   Basic Metabolic Panel: Recent Labs  Lab 09/19/20 2151  NA 134*  K 3.4*  CL 97*  CO2 27  GLUCOSE 169*  BUN 8  CREATININE 0.83  CALCIUM 8.9   GFR: Estimated Creatinine Clearance: 71.6 mL/min (by C-G formula based on SCr of 0.83 mg/dL). Liver Function Tests: Recent Labs  Lab 09/19/20 2151  AST 251*  ALT 228*  ALKPHOS 132*  BILITOT 3.6*  PROT 8.3*  ALBUMIN 3.9   Recent Labs  Lab 09/19/20 2151  LIPASE 29   No results for input(s): AMMONIA in the last 168 hours. Coagulation Profile: No results for input(s): INR, PROTIME in the last 168 hours. Cardiac Enzymes: No results for input(s): CKTOTAL, CKMB, CKMBINDEX, TROPONINI in the last 168 hours. BNP (last 3 results) No results for input(s): PROBNP in the last 8760 hours. HbA1C: No results for input(s): HGBA1C in the last 72 hours. CBG: No results for input(s): GLUCAP in the last 168 hours. Lipid Profile: No results for input(s): CHOL, HDL, LDLCALC, TRIG, CHOLHDL, LDLDIRECT in the last 72 hours. Thyroid Function Tests: No results for input(s): TSH, T4TOTAL, FREET4, T3FREE, THYROIDAB in the last 72 hours. Anemia Panel: No results for input(s): VITAMINB12, FOLATE, FERRITIN, TIBC, IRON, RETICCTPCT in the last 72 hours. Urine analysis:    Component Value Date/Time   COLORURINE AMBER (A) 09/19/2020  2154   APPEARANCEUR HAZY (A) 09/19/2020 2154   LABSPEC 1.015 09/19/2020 2154   PHURINE 6.0 09/19/2020 2154   GLUCOSEU NEGATIVE 09/19/2020 2154   HGBUR SMALL (A) 09/19/2020 2154   BILIRUBINUR SMALL (A) 09/19/2020 2154   KETONESUR 5 (A) 09/19/2020 2154   PROTEINUR 30 (A) 09/19/2020 2154   NITRITE NEGATIVE 09/19/2020 2154   LEUKOCYTESUR TRACE (A) 09/19/2020 2154   Sepsis Labs: No results found for this or any previous visit (from the past 240 hour(s)).   Radiological Exams on Admission: US Abdomen Limited RUQ (LIVER/GB)  Result Date: 09/19/2020 CLINICAL DATA:  Right upper quadrant pain. EXAM: ULTRASOUND ABDOMEN LIMITED RIGHT UPPER QUADRANT COMPARISON:  CT abdomen pelvis 08/26/2020 FINDINGS: Gallbladder: Multiple calcified gallstones noted within the gallbladder lumen. No gallbladder wall thickening or pericholecystic fluid visualized. No sonographic Murphy sign noted by sonographer. Common bile duct: Diameter: 10 mm.  No intrahepatic biliary ductal dilatation. Liver: No focal lesion identified. Increased parenchymal echogenicity. Portal vein is patent on color Doppler imaging with normal direction of blood flow towards the liver. Other: None. IMPRESSION: 1. Cholelithiasis with no findings of acute cholecystitis. 2. Stable enlarged common bile duct measuring up to 10 mm. 3. Hepatic steatosis. Please note limited evaluation for focal hepatic masses in a patient with hepatic steatosis due to decreased penetration of the acoustic ultrasound waves. Electronically Signed   By: Iven Finn M.D.   On: 09/19/2020 22:21    Assessment/Plan Cholelithiasis with Transaminitis and hyperbilirubinemia: Patient presents with complaints of epigastric, but mostly right upper quadrant pain with nausea and vomiting.  Labs were significant for alkaline phosphatase 132, AST 251, ALT 228, total bilirubin 3.6.  Ultrasound significant for cholelithiasis with chronically dilated common bile duct.  Question the possibility  of biliary obstruction as cause for acute elevation in liver enzymes and total bilirubin. -Admit to a telemetry bed -N.p.o. until MRCP and clear liquid diet thereafter -Follow-up MRCP -Continue normal saline IV fluids at 75 mL/h -Recheck CMP and total bilirubin in a.m.  Leukocytosis: WBC elevated 13.3.  Patient was otherwise noted to be afebrile no acute signs of infection appreciated. -Recheck CBC tomorrow morning  Nausea and vomiting: Suspect secondary to above. -Continue to monitor  Hypokalemia: Acute.  Potassium 3.4 on admission.  Suspect secondary to patient reports some nausea and vomiting -Potassium chloride 40 mEq  Anxiety and depression -Continue Cymbalta  GERD -Continue Pepcid, Protonix, and Carafate  Obesity: BMI of 32.44 kg/m  DVT prophylaxis: Lovenox Code Status: Full Family Communication: Husband updated at bedside Disposition Plan: Home once medically stable Consults called:  GI Admission status: Observation  Norval Morton MD [facility]   If 7PM-7AM, please contact night-coverage   09/20/2020, 2:24 PM

## 2020-09-20 NOTE — ED Provider Notes (Signed)
[facility] EMERGENCY DEPARTMENT Provider Note   CSN: [id number] Arrival date & time: 09/19/20  2115     History Chief Complaint  Patient presents with   Abdominal Pain    BIJOU EASLER is a 65 y.o. female.  Patient complains of abdominal pain.  She had a severe couple days ago.  The history is provided by the patient and medical records. No language interpreter was used.  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     Family History  Problem Relation Age of Onset   Cancer Mother     Social History   Tobacco Use   Smoking status: Never   Smokeless tobacco: Never  Vaping Use   Vaping Use: Never used  Substance Use Topics   Alcohol use: No   Drug use: No    Home Medications Prior to Admission medications   Medication Sig Start Date End Date Taking? Authorizing Provider  DULoxetine (CYMBALTA) 60 MG capsule TAKE TWO CAPSULES EACH DAY Patient taking differently: Take 120 mg by mouth daily. 02/19/20  Yes Dettinger, [name], MD  estradiol (ESTRACE) 2 MG tablet Take 1 mg by mouth daily. 08/03/20  Yes [provider]   famotidine (PEPCID) 40 MG tablet Take 1 tablet (40 mg total) by mouth daily. 09/19/20  Yes Gwenlyn Perking, FNP  ibuprofen (ADVIL) 200 MG tablet Take 400-600 mg by mouth every 6 (six) hours as needed for fever, headache or mild pain.   Yes [provider]  ondansetron (ZOFRAN ODT) 8 MG disintegrating tablet Take 1 tablet (8 mg total) by mouth every 8 (eight) hours as needed for nausea or vomiting. 09/19/20  Yes Gwenlyn Perking, FNP  oxymetazoline (AFRIN) 0.05 % nasal spray Place 1-2 sprays into both nostrils 2 (two) times daily as needed for congestion.   Yes [provider]  pantoprazole (PROTONIX) 40 MG tablet Take 1 tablet (40 mg total) by mouth daily. 09/19/20  Yes Gwenlyn Perking, FNP  pregabalin (LYRICA) 100 MG capsule Take 1 capsule (100 mg total) by mouth 2 (two) times daily. 05/02/20  Yes Patel, Donika K, DO  sucralfate (CARAFATE) 1 g tablet Take 1 tablet (1 g total) by mouth 4 (four) times daily -  with meals and at bedtime. 09/19/20  Yes Gwenlyn Perking, FNP  alum & mag hydroxide-simeth [facility] MAXIMUM STRENGTH) 400-400-40 MG/5ML suspension Take 15 mLs by mouth every 6 (six) hours as needed for indigestion. Patient not taking: Reported on 09/20/2020 08/26/20   Loni Beckwith, PA-C  estradiol (ESTRACE) 1 MG tablet Take 1 tablet (1 mg total)  by mouth daily. Patient not taking: Reported on 09/20/2020 02/19/20   Dettinger, [name], MD  omeprazole (PRILOSEC) 40 MG capsule Take 40 mg by mouth daily.    [provider]    Allergies    Patient has no known allergies.  Review of Systems   Review of Systems  Constitutional:  Negative for appetite change and fatigue.  HENT:  Negative for congestion, ear discharge and sinus pressure.   Eyes:  Negative for discharge.  Respiratory:  Negative for cough.   Cardiovascular:  Negative for chest pain.  Gastrointestinal:  Positive for abdominal pain. Negative for diarrhea.  Genitourinary:  Negative for frequency  and hematuria.  Musculoskeletal:  Negative for back pain.  Skin:  Negative for rash.  Neurological:  Negative for seizures and headaches.  Psychiatric/Behavioral:  Negative for hallucinations.    Physical Exam Updated Vital Signs BP (!) 149/85   Pulse 93   Temp 98.1 F (36.7 C) (Oral)   Resp 19   SpO2 96%   Physical Exam Vitals and nursing note reviewed.  Constitutional:      Appearance: She is well-developed.  HENT:     Head: Normocephalic.     Mouth/Throat:     Mouth: Mucous membranes are moist.  Eyes:     General: No scleral icterus.    Conjunctiva/sclera: Conjunctivae normal.  Neck:     Thyroid: No thyromegaly.  Cardiovascular:     Rate and Rhythm: Normal rate and regular rhythm.     Heart sounds: No murmur heard.   No friction rub. No gallop.  Pulmonary:     Breath sounds: No stridor. No wheezing or rales.  Chest:     Chest wall: No tenderness.  Abdominal:     General: There is no distension.     Tenderness: There is abdominal tenderness. There is no rebound.  Musculoskeletal:        General: Normal range of motion.     Cervical back: Neck supple.  Lymphadenopathy:     Cervical: No cervical adenopathy.  Skin:    Findings: No erythema or rash.  Neurological:     Mental Status: She is alert and oriented to person, place, and time.     Motor: No abnormal muscle tone.     Coordination: Coordination normal.  Psychiatric:        Behavior: Behavior normal.    ED Results / Procedures / Treatments   Labs (all labs ordered are listed, but only abnormal results are displayed) Labs Reviewed  CBC WITH DIFFERENTIAL/PLATELET - Abnormal; Notable for the following components:      Result Value   WBC 13.3 (*)    Platelets 419 (*)    Neutro Abs 10.2 (*)    All other components within normal limits  COMPREHENSIVE METABOLIC PANEL - Abnormal; Notable for the following components:   Sodium 134 (*)    Potassium 3.4 (*)    Chloride 97 (*)    Glucose, Bld 169 (*)     Total Protein 8.3 (*)    AST 251 (*)    ALT 228 (*)    Alkaline Phosphatase 132 (*)    Total Bilirubin 3.6 (*)    All other components within normal limits  URINALYSIS, ROUTINE W REFLEX MICROSCOPIC - Abnormal; Notable for the following components:   Color, Urine AMBER (*)    APPearance HAZY (*)    Hgb urine dipstick SMALL (*)    Bilirubin Urine SMALL (*)    Ketones, ur 5 (*)  Protein, ur 30 (*)    Leukocytes,Ua TRACE (*)    Bacteria, UA RARE (*)    All other components within normal limits  LIPASE, BLOOD    EKG None  Radiology US Abdomen Limited RUQ (LIVER/GB)  Result Date: 09/19/2020 CLINICAL DATA:  Right upper quadrant pain. EXAM: ULTRASOUND ABDOMEN LIMITED RIGHT UPPER QUADRANT COMPARISON:  CT abdomen pelvis 08/26/2020 FINDINGS: Gallbladder: Multiple calcified gallstones noted within the gallbladder lumen. No gallbladder wall thickening or pericholecystic fluid visualized. No sonographic Murphy sign noted by sonographer. Common bile duct: Diameter: 10 mm.  No intrahepatic biliary ductal dilatation. Liver: No focal lesion identified. Increased parenchymal echogenicity. Portal vein is patent on color Doppler imaging with normal direction of blood flow towards the liver. Other: None. IMPRESSION: 1. Cholelithiasis with no findings of acute cholecystitis. 2. Stable enlarged common bile duct measuring up to 10 mm. 3. Hepatic steatosis. Please note limited evaluation for focal hepatic masses in a patient with hepatic steatosis due to decreased penetration of the acoustic ultrasound waves. Electronically Signed   By: Iven Finn M.D.   On: 09/19/2020 22:21    Procedures Procedures   Medications Ordered in ED Medications  sodium chloride 0.9 % bolus 1,000 mL (has no administration in time range)    ED Course  I have reviewed the triage vital signs and the nursing notes.  Pertinent labs & imaging results that were available during my care of the patient were reviewed by me and  considered in my medical decision making (see chart for details). Patient with elevated liver enzymes and abdominal pain with gallstones but no cholecystitis.  I spoke with [facility] and they will see the patient in consult and requested medicine admission   MDM Rules/Calculators/A&P                          Abdominal pain with elevated liver enzymes.  Patient will be admitted to medicine with GI consult Final Clinical Impression(s) / ED Diagnoses Final diagnoses:  RUQ pain  Pain of upper abdomen    Rx / DC Orders ED Discharge Orders     None        Milton Ferguson, MD 09/20/20 1408

## 2020-09-20 NOTE — Progress Notes (Signed)
Pt transported to MRI 

## 2020-09-20 NOTE — Consult Note (Signed)
Referring Provider: Dr. Roderic Palau Primary Care Physician:  Dettinger, [name], MD Primary Gastroenterologist:  Althia Forts (new appt with Dr. Paulita Fujita 9/22)  Reason for Consultation:  Epigastric pain; Elevated LFTs  HPI: Andrea Pittman is a 65 y.o. female with chronic history of GERD on Pepcid 40 mg/day who recently had worsening of her sharp epigastric pain and upper quadrant pain for the last several days. Associated N/V as well. Denies hematemesis, hematochezia, or melena. Denies dysphagia. Has not had an EGD. Denies NSAIDs. Occasional alcohol. Reports a normal colonoscopy at age 67. LFTs increased with TB 3.6, ALP 132, AST 251, ALT 228, Lipase 29. LFTs were normal on 08/31/20. Unrevealing CT angio on 08/26/20. U/S shows gallstones without cholecystitis. Enlarged CBD to 10 mm stable c/w recent CT. COVID test pending.  Past Medical History:  Diagnosis Date   Anxiety    Depression    GERD (gastroesophageal reflux disease)     Past Surgical History:  Procedure Laterality Date   ABDOMINAL HYSTERECTOMY     BILATERAL CARPAL TUNNEL RELEASE      Prior to Admission medications   Medication Sig Start Date End Date Taking? Authorizing Provider  DULoxetine (CYMBALTA) 60 MG capsule TAKE TWO CAPSULES EACH DAY Patient taking differently: Take 120 mg by mouth daily. 02/19/20  Yes Dettinger, [name], MD  estradiol (ESTRACE) 2 MG tablet Take 1 mg by mouth daily. 08/03/20  Yes [provider]  famotidine (PEPCID) 40 MG tablet Take 1 tablet (40 mg total) by mouth daily. 09/19/20  Yes Gwenlyn Perking, FNP  ibuprofen (ADVIL) 200 MG tablet Take 400-600 mg by mouth every 6 (six) hours as needed for fever, headache or mild pain.   Yes [provider]  ondansetron (ZOFRAN ODT) 8 MG disintegrating tablet Take 1 tablet (8 mg total) by mouth every 8 (eight) hours as needed for nausea or vomiting. 09/19/20  Yes Gwenlyn Perking, FNP  oxymetazoline (AFRIN) 0.05 % nasal spray Place 1-2 sprays into both  nostrils 2 (two) times daily as needed for congestion.   Yes [provider]  pantoprazole (PROTONIX) 40 MG tablet Take 1 tablet (40 mg total) by mouth daily. 09/19/20  Yes Gwenlyn Perking, FNP  pregabalin (LYRICA) 100 MG capsule Take 1 capsule (100 mg total) by mouth 2 (two) times daily. 05/02/20  Yes Patel, Donika K, DO  sucralfate (CARAFATE) 1 g tablet Take 1 tablet (1 g total) by mouth 4 (four) times daily -  with meals and at bedtime. 09/19/20  Yes Gwenlyn Perking, FNP  alum & mag hydroxide-simeth [facility] MAXIMUM STRENGTH) 400-400-40 MG/5ML suspension Take 15 mLs by mouth every 6 (six) hours as needed for indigestion. Patient not taking: Reported on 09/20/2020 08/26/20   Loni Beckwith, PA-C  estradiol (ESTRACE) 1 MG tablet Take 1 tablet (1 mg total) by mouth daily. Patient not taking: Reported on 09/20/2020 02/19/20   Dettinger, [name], MD  omeprazole (PRILOSEC) 40 MG capsule Take 40 mg by mouth daily.    [provider]    Scheduled Meds: Continuous Infusions: PRN Meds:.  Allergies as of 09/19/2020   (No Known Allergies)    Family History  Problem Relation Age of Onset   Cancer Mother     Social History   Socioeconomic History   Marital status: Married    Spouse name: Not on file   Number of children: Not on file   Years of education: Not on file   Highest education level: Not on file  Occupational History   Not on file  Tobacco Use   Smoking status: Never   Smokeless tobacco: Never  Vaping Use   Vaping Use: Never used  Substance and Sexual Activity   Alcohol use: No   Drug use: No   Sexual activity: Not on file  Other Topics Concern   Not on file  Social History Narrative   She does not work and watches her grandson.    Right handed    Social Determinants of Health   Financial Resource Strain: Not on file  Food Insecurity: Not on file  Transportation Needs: Not on file  Physical Activity: Not on file  Stress: Not on file  Social  Connections: Not on file  Intimate Partner Violence: Not on file    Review of Systems: All negative except as stated above in HPI.  Physical Exam: Vital signs: Vitals:   09/20/20 1415 09/20/20 1430  BP: (!) 153/81 (!) 150/81  Pulse: 95 87  Resp: 17 16  Temp:    SpO2: 96% 96%  T 98.1   General:   Lethargic, elderly, Well-developed, well-nourished, pleasant and cooperative in NAD Head: normocephalic, atraumatic Eyes: anicteric sclera ENT: oropharynx clear Neck: supple, nontender Lungs:  Clear throughout to auscultation.   No wheezes, crackles, or rhonchi. No acute distress. Heart:  Regular rate and rhythm; no murmurs, clicks, rubs,  or gallops. Abdomen: upper quadrant tenderness with guarding (greatest in epigastric region), soft, nondistended, +BS  Rectal:  Deferred Ext: no edema  GI:  Lab Results: Recent Labs    09/19/20 2151  WBC 13.3*  HGB 14.8  HCT 44.8  PLT 419*   BMET Recent Labs    09/19/20 2151  NA 134*  K 3.4*  CL 97*  CO2 27  GLUCOSE 169*  BUN 8  CREATININE 0.83  CALCIUM 8.9   LFT Recent Labs    09/19/20 2151  PROT 8.3*  ALBUMIN 3.9  AST 251*  ALT 228*  ALKPHOS 132*  BILITOT 3.6*   PT/INR No results for input(s): LABPROT, INR in the last 72 hours.   Studies/Results: US Abdomen Limited RUQ (LIVER/GB)  Result Date: 09/19/2020 CLINICAL DATA:  Right upper quadrant pain. EXAM: ULTRASOUND ABDOMEN LIMITED RIGHT UPPER QUADRANT COMPARISON:  CT abdomen pelvis 08/26/2020 FINDINGS: Gallbladder: Multiple calcified gallstones noted within the gallbladder lumen. No gallbladder wall thickening or pericholecystic fluid visualized. No sonographic Murphy sign noted by sonographer. Common bile duct: Diameter: 10 mm.  No intrahepatic biliary ductal dilatation. Liver: No focal lesion identified. Increased parenchymal echogenicity. Portal vein is patent on color Doppler imaging with normal direction of blood flow towards the liver. Other: None. IMPRESSION: 1.  Cholelithiasis with no findings of acute cholecystitis. 2. Stable enlarged common bile duct measuring up to 10 mm. 3. Hepatic steatosis. Please note limited evaluation for focal hepatic masses in a patient with hepatic steatosis due to decreased penetration of the acoustic ultrasound waves. Electronically Signed   By: Iven Finn M.D.   On: 09/19/2020 22:21    Impression/Plan: Epigastric pain with elevated LFTs. MRCP needed to evaluate for choledocholithiasis. If MRCP unrevealing and LFTs improve, then will need an EGD to further evaluate. Follow LFTs and if rising and MRCP negative then will need a HIDA scan and surgery consult if HIDA scan is positivie. NPO until MRCP and then clear liquid diet after MRCP has been done. Supportive care.    LOS: 0 days   Lear Ng  09/20/2020, 3:30 PM  Questions please call [phone]

## 2020-09-21 DIAGNOSIS — K8064 Calculus of gallbladder and bile duct with chronic cholecystitis without obstruction: Secondary | ICD-10-CM | POA: Diagnosis present

## 2020-09-21 DIAGNOSIS — K8050 Calculus of bile duct without cholangitis or cholecystitis without obstruction: Secondary | ICD-10-CM | POA: Diagnosis present

## 2020-09-21 DIAGNOSIS — E669 Obesity, unspecified: Secondary | ICD-10-CM | POA: Diagnosis present

## 2020-09-21 DIAGNOSIS — K760 Fatty (change of) liver, not elsewhere classified: Secondary | ICD-10-CM | POA: Diagnosis present

## 2020-09-21 DIAGNOSIS — Z79899 Other long term (current) drug therapy: Secondary | ICD-10-CM | POA: Diagnosis not present

## 2020-09-21 DIAGNOSIS — F32A Depression, unspecified: Secondary | ICD-10-CM | POA: Diagnosis present

## 2020-09-21 DIAGNOSIS — Z20822 Contact with and (suspected) exposure to covid-19: Secondary | ICD-10-CM | POA: Diagnosis present

## 2020-09-21 DIAGNOSIS — K219 Gastro-esophageal reflux disease without esophagitis: Secondary | ICD-10-CM | POA: Diagnosis present

## 2020-09-21 DIAGNOSIS — G609 Hereditary and idiopathic neuropathy, unspecified: Secondary | ICD-10-CM | POA: Diagnosis present

## 2020-09-21 DIAGNOSIS — K8021 Calculus of gallbladder without cholecystitis with obstruction: Secondary | ICD-10-CM | POA: Diagnosis not present

## 2020-09-21 DIAGNOSIS — R519 Headache, unspecified: Secondary | ICD-10-CM | POA: Diagnosis present

## 2020-09-21 DIAGNOSIS — E876 Hypokalemia: Secondary | ICD-10-CM | POA: Diagnosis present

## 2020-09-21 DIAGNOSIS — K8051 Calculus of bile duct without cholangitis or cholecystitis with obstruction: Secondary | ICD-10-CM | POA: Diagnosis not present

## 2020-09-21 DIAGNOSIS — R1011 Right upper quadrant pain: Secondary | ICD-10-CM | POA: Diagnosis present

## 2020-09-21 DIAGNOSIS — Z6832 Body mass index (BMI) 32.0-32.9, adult: Secondary | ICD-10-CM | POA: Diagnosis not present

## 2020-09-21 DIAGNOSIS — K660 Peritoneal adhesions (postprocedural) (postinfection): Secondary | ICD-10-CM | POA: Diagnosis present

## 2020-09-21 DIAGNOSIS — Z79818 Long term (current) use of other agents affecting estrogen receptors and estrogen levels: Secondary | ICD-10-CM | POA: Diagnosis not present

## 2020-09-21 DIAGNOSIS — F419 Anxiety disorder, unspecified: Secondary | ICD-10-CM | POA: Diagnosis present

## 2020-09-21 LAB — CBC
HCT: 39.9 % (ref 36.0–46.0)
Hemoglobin: 13.1 g/dL (ref 12.0–15.0)
MCH: 29.8 pg (ref 26.0–34.0)
MCHC: 32.8 g/dL (ref 30.0–36.0)
MCV: 90.9 fL (ref 80.0–100.0)
Platelets: 329 10*3/uL (ref 150–400)
RBC: 4.39 MIL/uL (ref 3.87–5.11)
RDW: 13.8 % (ref 11.5–15.5)
WBC: 10.1 10*3/uL (ref 4.0–10.5)
nRBC: 0 % (ref 0.0–0.2)

## 2020-09-21 LAB — HIV ANTIBODY (ROUTINE TESTING W REFLEX): HIV Screen 4th Generation wRfx: NONREACTIVE

## 2020-09-21 LAB — COMPREHENSIVE METABOLIC PANEL
ALT: 155 U/L — ABNORMAL HIGH (ref 0–44)
AST: 93 U/L — ABNORMAL HIGH (ref 15–41)
Albumin: 3.1 g/dL — ABNORMAL LOW (ref 3.5–5.0)
Alkaline Phosphatase: 114 U/L (ref 38–126)
Anion gap: 6 (ref 5–15)
BUN: 10 mg/dL (ref 8–23)
CO2: 27 mmol/L (ref 22–32)
Calcium: 8.4 mg/dL — ABNORMAL LOW (ref 8.9–10.3)
Chloride: 104 mmol/L (ref 98–111)
Creatinine, Ser: 0.87 mg/dL (ref 0.44–1.00)
GFR, Estimated: >60 mL/min (ref >60)
Glucose, Bld: 95 mg/dL (ref 70–99)
Potassium: 3.7 mmol/L (ref 3.5–5.1)
Sodium: 137 mmol/L (ref 135–145)
Total Bilirubin: 1.5 mg/dL — ABNORMAL HIGH (ref 0.3–1.2)
Total Protein: 6.7 g/dL (ref 6.5–8.1)

## 2020-09-21 LAB — BILIRUBIN, DIRECT: Bilirubin, Direct: 0.6 mg/dL — ABNORMAL HIGH (ref 0.0–0.2)

## 2020-09-21 LAB — HEPATITIS PANEL, ACUTE
HCV Ab: NONREACTIVE
Hep A IgM: NONREACTIVE
Hep B C IgM: NONREACTIVE
Hepatitis B Surface Ag: NONREACTIVE

## 2020-09-21 LAB — PROTIME-INR
INR: 1 (ref 0.8–1.2)
Prothrombin Time: 13.5 seconds (ref 11.4–15.2)

## 2020-09-21 MED ORDER — PIPERACILLIN-TAZOBACTAM 3.375 G IVPB 30 MIN
3.3750 g | INTRAVENOUS | Status: DC
  Filled 2020-09-21: qty 50

## 2020-09-21 MED ORDER — KETOROLAC TROMETHAMINE 15 MG/ML IJ SOLN
15.0000 mg | Freq: Four times a day (QID) | INTRAMUSCULAR | Status: DC | PRN
  Administered 2020-09-21: 15 mg via INTRAVENOUS
  Filled 2020-09-21: qty 1

## 2020-09-21 NOTE — Progress Notes (Signed)
PROGRESS NOTE  Andrea Pittman QVZ:[id number] DOB: [birth year] DOA: 09/19/2020 PCP: Dettinger, [name], MD   LOS: 0 days   Brief Narrative / Interim history: 65 year old female with anxiety, depression, GERD comes into the hospital with right upper quadrant/bandlike abdominal pain 3 days prior.  The pain was worse, sometimes focusing in the right upper quadrant, with tenderness to palpation in that area.  She is also experienced nausea, vomiting and has had poor p.o. intake over the last few days.  In the ED an ultrasound revealed cholelithiasis with no acute findings of cholecystitis and an enlarged CBD.  An MRI showed choledocholithiasis and GI was consulted  Subjective / 24h Interval events: She is feeling better this morning, no longer has abdominal pain, no nausea or vomiting.  Assessment & Plan: Principal Problem Cholelithiasis, choledocholithiasis -GI consulted and following.  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  She would benefit from weight loss  Scheduled Meds:  DULoxetine  120 mg Oral Daily   enoxaparin (LOVENOX) injection  40 mg Subcutaneous Q24H   famotidine  40 mg Oral Daily   pantoprazole  40 mg Oral QHS   pregabalin  100 mg Oral BID   sodium chloride flush  3 mL Intravenous Q12H   sucralfate  1 g Oral TID WC & HS   Continuous Infusions:  sodium chloride 75 mL/hr at 09/20/20 1735   PRN Meds:.albuterol, ondansetron **OR** ondansetron (ZOFRAN) IV  Diet Orders (From admission, onward)     Start     Ordered   09/22/20 0001  Diet NPO time specified  Diet effective midnight        09/21/20 0918   09/21/20 0919  DIET SOFT Room service appropriate? Yes; Fluid consistency: Thin  Diet  effective now       Question Answer Comment  Room service appropriate? Yes   Fluid consistency: Thin      09/21/20 0918            DVT prophylaxis: enoxaparin (LOVENOX) injection 40 mg Start: 09/20/20 2200     Code Status: Full Code  Family Communication: No family at bedside  Status is: Inpatient  Remains inpatient appropriate because:Inpatient level of care appropriate due to severity of illness  Dispo: The patient is from: Home              Anticipated d/c is to: Home              Patient currently is not medically stable to d/c.   Difficult to place patient No   Level of care: Med-Surg  Consultants:  GI  Procedures:  ERCP - pending  Microbiology  None   Antimicrobials: None     Objective: Vitals:   09/20/20 1845 09/20/20 1941 09/20/20 2346 09/21/20 0404  BP:  (!) 164/99 (!) 148/79 126/72  Pulse:  87 89 79  Resp:  15 19 15   Temp:  98.4 F (36.9 C) 98.3 F (36.8 C) 98.5 F (36.9 C)  TempSrc:  Oral Oral Oral  SpO2:  97% 95% 94%  Weight: 85.7 kg     Height: 5\' 4"  (1.626 m)       Intake/Output Summary (Last 24 hours) at 09/21/2020 1033 Last data filed at 09/20/2020 1735 Gross per 24 hour  Intake 0 ml  Output --  Net 0 ml   Filed Weights   09/20/20 1845  Weight: 85.7 kg    Examination:  Constitutional: NAD Eyes: no scleral icterus ENMT: Mucous membranes are moist.  Neck: normal, supple Respiratory: clear to auscultation bilaterally, no wheezing, no crackles. Normal respiratory effort.  Cardiovascular: Regular rate and rhythm, no murmurs / rubs / gallops. No LE edema.  Abdomen: non distended, no tenderness. Bowel sounds positive.  Musculoskeletal: no clubbing / cyanosis.  Skin: no rashes Neurologic: CN 2-12 grossly intact. Strength 5/5 in all 4.    Data Reviewed: I have independently reviewed following labs and imaging studies  CBC: Recent Labs  Lab 09/19/20 2151 09/21/20 0126  WBC 13.3* 10.1  NEUTROABS 10.2*  --   HGB 14.8  13.1  HCT 44.8 39.9  MCV 90.3 90.9  PLT 419* 161   Basic Metabolic Panel: Recent Labs  Lab 09/19/20 2151 09/21/20 0126  NA 134* 137  K 3.4* 3.7  CL 97* 104  CO2 27 27  GLUCOSE 169* 95  BUN 8 10  CREATININE 0.83 0.87  CALCIUM 8.9 8.4*   Liver Function Tests: Recent Labs  Lab 09/19/20 2151 09/21/20 0126  AST 251* 93*  ALT 228* 155*  ALKPHOS 132* 114  BILITOT 3.6* 1.5*  PROT 8.3* 6.7  ALBUMIN 3.9 3.1*   Coagulation Profile: No results for input(s): INR, PROTIME in the last 168 hours. HbA1C: No results for input(s): HGBA1C in the last 72 hours. CBG: No results for input(s): GLUCAP in the last 168 hours.  Recent Results (from the past 240 hour(s))  Resp Panel by RT-PCR (Flu A&B, Covid) Nasopharyngeal Swab     Status: None   Collection Time: 09/20/20  2:24 PM   Specimen: Nasopharyngeal Swab; Nasopharyngeal(NP) swabs in vial transport medium  Result Value Ref Range Status   SARS Coronavirus 2 by RT PCR NEGATIVE NEGATIVE Final    Comment: (NOTE) SARS-CoV-2 target nucleic acids are NOT DETECTED.  The SARS-CoV-2 RNA is generally detectable in upper respiratory specimens during the acute phase of infection. The lowest concentration of SARS-CoV-2 viral copies this assay can detect is 138 copies/mL. A negative result does not preclude SARS-Cov-2 infection and should not be used as the sole basis for treatment or other patient management decisions. A negative result may occur with  improper specimen collection/handling, submission of specimen other than nasopharyngeal swab, presence of viral mutation(s) within the areas targeted by this assay, and inadequate number of viral copies(<138 copies/mL). A negative result must be combined with clinical observations, patient history, and epidemiological information. The expected result is Negative.  Fact Sheet for Patients:  EntrepreneurPulse.com.au  Fact Sheet for Healthcare Providers:   IncredibleEmployment.be  This test is no t yet approved or cleared by the [place] FDA and  has been authorized for detection and/or diagnosis of SARS-CoV-2 by FDA under an Emergency Use Authorization (EUA). This EUA will remain  in effect (meaning this test can be used) for the duration of the COVID-19 declaration under Section 564(b)(1) of the Act, 21 U.S.C.section 360bbb-3(b)(1), unless the authorization is terminated  or revoked sooner.       Influenza A by PCR NEGATIVE NEGATIVE Final   Influenza B by PCR NEGATIVE NEGATIVE Final    Comment: (NOTE) The Xpert Xpress SARS-CoV-2/FLU/RSV plus assay is intended as an aid in the diagnosis of influenza from Nasopharyngeal swab specimens and should not be used as a sole basis for treatment. Nasal washings and aspirates are  unacceptable for Xpert Xpress SARS-CoV-2/FLU/RSV testing.  Fact Sheet for Patients: EntrepreneurPulse.com.au  Fact Sheet for Healthcare Providers: IncredibleEmployment.be  This test is not yet approved or cleared by the [place] FDA and has been authorized for detection and/or diagnosis of SARS-CoV-2 by FDA under an Emergency Use Authorization (EUA). This EUA will remain in effect (meaning this test can be used) for the duration of the COVID-19 declaration under Section 564(b)(1) of the Act, 21 U.S.C. section 360bbb-3(b)(1), unless the authorization is terminated or revoked.  Performed at [facility], [place] [address] [place], [place] [address]      Radiology Studies: MR ABDOMEN MRCP W WO CONTAST  Result Date: 09/20/2020 CLINICAL DATA:  Epigastric abdominal pain EXAM: MRI ABDOMEN WITHOUT AND WITH CONTRAST (INCLUDING MRCP) TECHNIQUE: Multiplanar multisequence MR imaging of the abdomen was performed both before and after the administration of intravenous contrast. Heavily T2-weighted images of the biliary and pancreatic ducts were obtained,  and three-dimensional MRCP images were rendered by post processing. CONTRAST:  8.4mL GADAVIST GADOBUTROL 1 MMOL/ML IV SOLN COMPARISON:  Right upper quadrant ultrasound dated 09/19/2020. CTA abdomen/pelvis dated 08/26/2020. FINDINGS: Lower chest: Lung bases are clear. Hepatobiliary: Severe hepatic steatosis. No focal hepatic lesion is seen. Numerous gallstones (series 6/image 20), without associated inflammatory changes. No intrahepatic ductal dilatation. Common duct measures 11 mm (series 4/image 18). Two distal CBD stones measuring up to 5 mm (series 4/image 15). Pancreas:  Within normal limits. Spleen:  Within normal limits. Adrenals/Urinary Tract:  Adrenal glands are within normal limits. Subcentimeter upper pole renal cysts.  No hydronephrosis. Stomach/Bowel: Stomach is within normal limits. Visualized bowel is unremarkable. Mild left colonic diverticulosis, without evidence of diverticulitis. Vascular/Lymphatic:  No evidence of abdominal aortic aneurysm. No suspicious abdominal lymphadenopathy. Other:  No abdominal ascites. Musculoskeletal: No focal osseous lesions. IMPRESSION: Choledocholithiasis with two distal CBD stones measuring up to 5 mm. Common duct measures 11 mm. ERCP is suggested. Numerous gallstones, without associated inflammatory changes to suggest acute cholecystitis. Severe hepatic steatosis. Electronically Signed   By: Julian Hy M.D.   On: 09/20/2020 21:31     Marzetta Board, MD, PhD [facility]  Between 7 am - 7 pm I am available, please contact me via Amion (for emergencies) or Securechat (non urgent messages)  Between 7 pm - 7 am I am not available, please contact night coverage MD/APP via Amion

## 2020-09-21 NOTE — Progress Notes (Signed)
[facility] Gastroenterology Progress Note  Andrea Pittman 65 y.o. October 21, 1955  CC: Abdominal pain, choledocholithiasis   Subjective: Patient seen and examined at bedside.  Feeling much better.  Abdominal pain has resolved.  Denies nausea and vomiting.  ROS : Afebrile, negative for chest pain   Objective: Vital signs in last 24 hours: Vitals:   09/20/20 2346 09/21/20 0404  BP: (!) 148/79 126/72  Pulse: 89 79  Resp: 19 15  Temp: 98.3 F (36.8 C) 98.5 F (36.9 C)  SpO2: 95% 94%    Physical Exam:  General:  Alert, cooperative, no distress, appears stated age  Head:  Normocephalic, without obvious abnormality, atraumatic  Eyes:  , EOM's intact,   Lungs:   Clear to auscultation bilaterally, respirations unlabored  Heart:  Regular rate and rhythm, S1, S2 normal  Abdomen:   Soft, non-tender, nondistended, bowel sounds present.  No peritoneal signs  Extremities: Extremities normal, atraumatic, no  edema  Pulses: 2+ and symmetric    Lab Results: Recent Labs    09/19/20 2151 09/21/20 0126  NA 134* 137  K 3.4* 3.7  CL 97* 104  CO2 27 27  GLUCOSE 169* 95  BUN 8 10  CREATININE 0.83 0.87  CALCIUM 8.9 8.4*   Recent Labs    09/19/20 2151 09/21/20 0126  AST 251* 93*  ALT 228* 155*  ALKPHOS 132* 114  BILITOT 3.6* 1.5*  PROT 8.3* 6.7  ALBUMIN 3.9 3.1*   Recent Labs    09/19/20 2151 09/21/20 0126  WBC 13.3* 10.1  NEUTROABS 10.2*  --   HGB 14.8 13.1  HCT 44.8 39.9  MCV 90.3 90.9  PLT 419* 329   No results for input(s): LABPROT, INR in the last 72 hours.    Assessment/Plan: -Choledocholithiasis.  MRI showed 2 5 mm stone within CBD with CBD dilation up to 11 mm. -Abdominal pain.  Resolved now.  LFTs improving.  Recommendation ------------------------ -Tentative plan for ERCP tomorrow with Dr. Watt Climes. -Okay to have soft diet today.  Keep n.p.o. past midnight. -Repeat LFTs in the morning -Check INR -She will eventually need cholecystectomy  Risks (post ERCP  pancreatitis, bleeding, infection, bowel perforation that could require surgery, sedation-related changes in cardiopulmonary systems), benefits (identification and possible treatment of source of symptoms, exclusion of certain causes of symptoms), and alternatives (watchful waiting, radiographic imaging studies, empiric medical treatment)  were explained to patient/family in detail and patient wishes to proceed.    Otis Brace MD, [place] 09/21/2020, 9:02 AM  Contact #  [phone]

## 2020-09-22 ENCOUNTER — Inpatient Hospital Stay (HOSPITAL_COMMUNITY): Payer: Medicare Other

## 2020-09-22 ENCOUNTER — Encounter (HOSPITAL_COMMUNITY)
Admission: EM | Disposition: A | Discharge status: Home / Self Care | Payer: Self-pay | Source: Home / Self Care | Attending: Internal Medicine

## 2020-09-22 ENCOUNTER — Encounter (HOSPITAL_COMMUNITY): Payer: Self-pay | Admitting: Internal Medicine

## 2020-09-22 ENCOUNTER — Inpatient Hospital Stay (HOSPITAL_COMMUNITY): Payer: Medicare Other | Admitting: Anesthesiology

## 2020-09-22 HISTORY — PX: ERCP: SHX5425

## 2020-09-22 HISTORY — PX: REMOVAL OF STONES: SHX5545

## 2020-09-22 HISTORY — PX: SPHINCTEROTOMY: SHX5544

## 2020-09-22 LAB — COMPREHENSIVE METABOLIC PANEL
ALT: 110 U/L — ABNORMAL HIGH (ref 0–44)
AST: 62 U/L — ABNORMAL HIGH (ref 15–41)
Albumin: 2.9 g/dL — ABNORMAL LOW (ref 3.5–5.0)
Alkaline Phosphatase: 97 U/L (ref 38–126)
Anion gap: 9 (ref 5–15)
BUN: 10 mg/dL (ref 8–23)
CO2: 23 mmol/L (ref 22–32)
Calcium: 8.6 mg/dL — ABNORMAL LOW (ref 8.9–10.3)
Chloride: 105 mmol/L (ref 98–111)
Creatinine, Ser: 0.89 mg/dL (ref 0.44–1.00)
GFR, Estimated: >60 mL/min (ref >60)
Glucose, Bld: 114 mg/dL — ABNORMAL HIGH (ref 70–99)
Potassium: 3.5 mmol/L (ref 3.5–5.1)
Sodium: 137 mmol/L (ref 135–145)
Total Bilirubin: 1.1 mg/dL (ref 0.3–1.2)
Total Protein: 6.2 g/dL — ABNORMAL LOW (ref 6.5–8.1)

## 2020-09-22 LAB — CBC
HCT: 37.4 % (ref 36.0–46.0)
Hemoglobin: 12.3 g/dL (ref 12.0–15.0)
MCH: 30 pg (ref 26.0–34.0)
MCHC: 32.9 g/dL (ref 30.0–36.0)
MCV: 91.2 fL (ref 80.0–100.0)
Platelets: 312 10*3/uL (ref 150–400)
RBC: 4.1 MIL/uL (ref 3.87–5.11)
RDW: 13.9 % (ref 11.5–15.5)
WBC: 8.3 10*3/uL (ref 4.0–10.5)
nRBC: 0 % (ref 0.0–0.2)

## 2020-09-22 SURGERY — ERCP, WITH INTERVENTION IF INDICATED
Anesthesia: General

## 2020-09-22 MED ORDER — INDOMETHACIN 50 MG RE SUPP
RECTAL | Status: AC
  Filled 2020-09-22: qty 2

## 2020-09-22 MED ORDER — LIDOCAINE 2% (20 MG/ML) 5 ML SYRINGE
INTRAMUSCULAR | Status: DC | PRN
  Administered 2020-09-22: 60 mg via INTRAVENOUS

## 2020-09-22 MED ORDER — CIPROFLOXACIN IN D5W 400 MG/200ML IV SOLN
INTRAVENOUS | Status: DC | PRN
Start: 2020-09-22 — End: 2020-09-22
  Administered 2020-09-22: 400 mg via INTRAVENOUS

## 2020-09-22 MED ORDER — SODIUM CHLORIDE 0.9 % IV SOLN
INTRAVENOUS | Status: DC | PRN
  Administered 2020-09-22: 35 mL

## 2020-09-22 MED ORDER — ONDANSETRON HCL 4 MG/2ML IJ SOLN
INTRAMUSCULAR | Status: DC | PRN
  Administered 2020-09-22: 4 mg via INTRAVENOUS

## 2020-09-22 MED ORDER — SODIUM CHLORIDE 0.9 % IV SOLN: INTRAVENOUS | Status: DC

## 2020-09-22 MED ORDER — SUGAMMADEX SODIUM 200 MG/2ML IV SOLN
INTRAVENOUS | Status: DC | PRN
  Administered 2020-09-22: 400 mg via INTRAVENOUS

## 2020-09-22 MED ORDER — SODIUM CHLORIDE 0.9 % IV SOLN
2.0000 g | INTRAVENOUS | Status: AC
  Administered 2020-09-23: 2 g via INTRAVENOUS
  Filled 2020-09-22: qty 2

## 2020-09-22 MED ORDER — FENTANYL CITRATE (PF) 100 MCG/2ML IJ SOLN
INTRAMUSCULAR | Status: DC | PRN
  Administered 2020-09-22: 100 ug via INTRAVENOUS

## 2020-09-22 MED ORDER — LACTATED RINGERS IV SOLN: INTRAVENOUS | Status: DC | PRN

## 2020-09-22 MED ORDER — PHENYLEPHRINE 40 MCG/ML (10ML) SYRINGE FOR IV PUSH (FOR BLOOD PRESSURE SUPPORT)
PREFILLED_SYRINGE | INTRAVENOUS | Status: DC | PRN
  Administered 2020-09-22 (×2): 120 ug via INTRAVENOUS

## 2020-09-22 MED ORDER — LACTATED RINGERS IV SOLN: Freq: Once | INTRAVENOUS | Status: AC

## 2020-09-22 MED ORDER — DEXAMETHASONE SODIUM PHOSPHATE 10 MG/ML IJ SOLN
INTRAMUSCULAR | Status: DC | PRN
  Administered 2020-09-22: 5 mg via INTRAVENOUS

## 2020-09-22 MED ORDER — PROPOFOL 10 MG/ML IV BOLUS
INTRAVENOUS | Status: DC | PRN
  Administered 2020-09-22: 100 mg via INTRAVENOUS

## 2020-09-22 MED ORDER — ROCURONIUM BROMIDE 10 MG/ML (PF) SYRINGE
PREFILLED_SYRINGE | INTRAVENOUS | Status: DC | PRN
  Administered 2020-09-22: 80 mg via INTRAVENOUS

## 2020-09-22 MED ORDER — MIDAZOLAM HCL 2 MG/2ML IJ SOLN
INTRAMUSCULAR | Status: DC | PRN
  Administered 2020-09-22: 2 mg via INTRAVENOUS

## 2020-09-22 MED ORDER — GLUCAGON HCL RDNA (DIAGNOSTIC) 1 MG IJ SOLR
INTRAMUSCULAR | Status: AC
  Filled 2020-09-22: qty 1

## 2020-09-22 MED ORDER — POTASSIUM CHLORIDE CRYS ER 20 MEQ PO TBCR
40.0000 meq | EXTENDED_RELEASE_TABLET | Freq: Once | ORAL | Status: AC
  Administered 2020-09-22: 40 meq via ORAL
  Filled 2020-09-22: qty 2

## 2020-09-22 MED ORDER — CIPROFLOXACIN IN D5W 400 MG/200ML IV SOLN
INTRAVENOUS | Status: AC
  Filled 2020-09-22: qty 200

## 2020-09-22 NOTE — Anesthesia Procedure Notes (Addendum)
Procedure Name: Intubation Date/Time: 09/22/2020 10:37 AM Performed by: Moshe Salisbury, CRNA Pre-anesthesia Checklist: Patient identified, Emergency Drugs available, Suction available and Patient being monitored Patient Re-evaluated:Patient Re-evaluated prior to induction Oxygen Delivery Method: Circle System Utilized Preoxygenation: Pre-oxygenation with 100% oxygen Induction Type: IV induction Ventilation: Mask ventilation without difficulty Laryngoscope Size: Mac and 3 Grade View: Grade I Tube type: Oral Tube size: 7.5 mm Number of attempts: 1 Airway Equipment and Method: Stylet Placement Confirmation: ETT inserted through vocal cords under direct vision, positive ETCO2 and breath sounds checked- equal and bilateral Secured at: 21 cm Tube secured with: Tape Dental Injury: Teeth and Oropharynx as per pre-operative assessment

## 2020-09-22 NOTE — Anesthesia Postprocedure Evaluation (Signed)
Anesthesia Post Note  Patient: Andrea Pittman  Procedure(s) Performed: ENDOSCOPIC RETROGRADE CHOLANGIOPANCREATOGRAPHY (ERCP) REMOVAL OF STONES SPHINCTEROTOMY     Patient location during evaluation: PACU Anesthesia Type: General Level of consciousness: awake and alert Pain management: pain level controlled Vital Signs Assessment: post-procedure vital signs reviewed and stable Respiratory status: spontaneous breathing, nonlabored ventilation, respiratory function stable and patient connected to nasal cannula oxygen Cardiovascular status: blood pressure returned to baseline and stable Postop Assessment: no apparent nausea or vomiting Anesthetic complications: no   No notable events documented.  Last Vitals:  Vitals:   09/22/20 1208 09/22/20 1245  BP: (!) 164/77 (!) 157/90  Pulse: 96 86  Resp: (!) 22 16  Temp:  36.9 C  SpO2: 93% 93%    Last Pain:  Vitals:   09/22/20 1245  TempSrc: Axillary  PainSc: 0-No pain                 Doak Mah L Blue Winther

## 2020-09-22 NOTE — Progress Notes (Addendum)
Consuella Lose 10:52 AM  Subjective: Patient doing well and hospital computer chart reviewed and case discussed with my partner Dr. Alessandra Bevels as well as the surgical team and she has no new complaints and we rediscussed the procedure including success rate risks etc.  Objective: Vital signs stable afebrile exam please see preassessment evaluation liver test slowly improving CBC okay MRCP previous CT and ultrasound reviewed  Assessment: Probable CBD stones  Plan: Okay to proceed with ERCP with anesthesia assistance and probable lap chole tomorrow pending any delayed complications or other new problems  [facility]  office [phone] After 5PM or if no answer call [phone]

## 2020-09-22 NOTE — Anesthesia Preprocedure Evaluation (Addendum)
Anesthesia Evaluation  Patient identified by MRN, date of birth, ID band Patient awake    Reviewed: Allergy & Precautions, NPO status , Patient's Chart, lab work & pertinent test results  Airway Mallampati: II  TM Distance: >3 FB Neck ROM: Full    Dental no notable dental hx. (+) Teeth Intact, Dental Advisory Given   Pulmonary neg pulmonary ROS,    Pulmonary exam normal breath sounds clear to auscultation       Cardiovascular negative cardio ROS Normal cardiovascular exam Rhythm:Regular Rate:Normal     Neuro/Psych PSYCHIATRIC DISORDERS Anxiety Depression negative neurological ROS     GI/Hepatic Neg liver ROS, GERD  ,  Endo/Other  negative endocrine ROS  Renal/GU negative Renal ROS  negative genitourinary   Musculoskeletal negative musculoskeletal ROS (+)   Abdominal   Peds  Hematology negative hematology ROS (+)   Anesthesia Other Findings CBD stones  Reproductive/Obstetrics                            Anesthesia Physical Anesthesia Plan  ASA: 2  Anesthesia Plan: General   Post-op Pain Management:    Induction: Intravenous  PONV Risk Score and Plan: 3 and Midazolam, Dexamethasone and Ondansetron  Airway Management Planned: Oral ETT  Additional Equipment:   Intra-op Plan:   Post-operative Plan: Extubation in OR  Informed Consent: I have reviewed the patients History and Physical, chart, labs and discussed the procedure including the risks, benefits and alternatives for the proposed anesthesia with the patient or authorized representative who has indicated his/her understanding and acceptance.     Dental advisory given  Plan Discussed with: CRNA  Anesthesia Plan Comments:         Anesthesia Quick Evaluation

## 2020-09-22 NOTE — Anesthesia Procedure Notes (Addendum)
Procedure Name: Intubation Date/Time: 09/22/2020 10:47 AM Performed by: Moshe Salisbury, CRNA Pre-anesthesia Checklist: Patient identified, Emergency Drugs available, Suction available and Patient being monitored Patient Re-evaluated:Patient Re-evaluated prior to induction Oxygen Delivery Method: Circle System Utilized Preoxygenation: Pre-oxygenation with 100% oxygen Induction Type: IV induction Ventilation: Mask ventilation without difficulty Laryngoscope Size: Mac and 3 Grade View: Grade I Tube type: Oral Tube size: 7.5 mm Number of attempts: 1 Airway Equipment and Method: Stylet Placement Confirmation: ETT inserted through vocal cords under direct vision, positive ETCO2 and breath sounds checked- equal and bilateral Secured at: 21 cm Tube secured with: Tape Dental Injury: Teeth and Oropharynx as per pre-operative assessment

## 2020-09-22 NOTE — Transfer of Care (Signed)
Immediate Anesthesia Transfer of Care Note  Patient: Andrea Pittman  Procedure(s) Performed: ENDOSCOPIC RETROGRADE CHOLANGIOPANCREATOGRAPHY (ERCP) REMOVAL OF STONES SPHINCTEROTOMY  Patient Location: Endoscopy Unit  Anesthesia Type:General  Level of Consciousness: drowsy and patient cooperative  Airway & Oxygen Therapy: Patient Spontanous Breathing and Patient connected to nasal cannula oxygen  Post-op Assessment: Report given to RN, Post -op Vital signs reviewed and stable and Patient moving all extremities  Post vital signs: Reviewed and stable  Last Vitals:  Vitals Value Taken Time  BP 178/88 09/22/20 1138  Temp 36.2 C 09/22/20 1140  Pulse 89 09/22/20 1141  Resp 16 09/22/20 1141  SpO2 99 % 09/22/20 1141  Vitals shown include unvalidated device data.  Last Pain:  Vitals:   09/22/20 1140  TempSrc: Oral  PainSc: 0-No pain         Complications: No notable events documented.

## 2020-09-22 NOTE — Progress Notes (Signed)
Pt has swollen upper lip on right side.  Pt denies pain to this area.

## 2020-09-22 NOTE — Consult Note (Signed)
Andrea Pittman 1955-10-28  [id number].    Requesting MD: Dr. Marzetta Board Chief Complaint/Reason for Consult: choledocholithiasis  HPI:  This is a pleasant otherwise healthy female with only GERD and idiopathic neuropathy, who began having some epigastric abdominal pain radiating to her chest in late June.  She has reflux, but this felt different.  She presented to the ED where her heart was evaluated and no acute findings.  She was asked to follow up with GI.  This appointment was scheduled for September.  Unfortunately, her symptoms returned on Sunday with a band like pain across her entire upper abdomen.  On Monday she developed N/V.  She denies any fevers, chills, SOB, dysuria, but admits to dark urine which has cleared up.  She presented to the [facility] where she was noted to have elevated TB of 3.3 and a dilated CBD.  She then underwent MRCP which revealed 2 64mm distal CBD stones.  Her urine has cleared up, pain resolved, and TB normalized.  She is tentatively scheduled for ERCP today.  We have been asked to see her for lap chole this admission.  ROS: ROS: Please see HPI, otherwise all other systems have been reviewed and are negative.  Family History  Problem Relation Age of Onset   Cancer Mother     Past Medical History:  Diagnosis Date   Anxiety    Depression    GERD (gastroesophageal reflux disease)     Past Surgical History:  Procedure Laterality Date   ABDOMINAL HYSTERECTOMY     BILATERAL CARPAL TUNNEL RELEASE     bladder tack      Social History:  reports that she has never smoked. She has never used smokeless tobacco. She reports that she does not drink alcohol and does not use drugs.  Allergies: No Known Allergies  Medications Prior to Admission  Medication Sig Dispense Refill   DULoxetine (CYMBALTA) 60 MG capsule TAKE TWO CAPSULES EACH DAY (Patient taking differently: Take 120 mg by mouth daily.) 90 capsule 3   estradiol (ESTRACE) 2 MG tablet Take 1 mg by  mouth daily.     famotidine (PEPCID) 40 MG tablet Take 1 tablet (40 mg total) by mouth daily. 30 tablet 0   ibuprofen (ADVIL) 200 MG tablet Take 400-600 mg by mouth every 6 (six) hours as needed for fever, headache or mild pain.     ondansetron (ZOFRAN ODT) 8 MG disintegrating tablet Take 1 tablet (8 mg total) by mouth every 8 (eight) hours as needed for nausea or vomiting. 20 tablet 0   oxymetazoline (AFRIN) 0.05 % nasal spray Place 1-2 sprays into both nostrils 2 (two) times daily as needed for congestion.     pantoprazole (PROTONIX) 40 MG tablet Take 1 tablet (40 mg total) by mouth daily. 30 tablet 3   pregabalin (LYRICA) 100 MG capsule Take 1 capsule (100 mg total) by mouth 2 (two) times daily. 60 capsule 5   sucralfate (CARAFATE) 1 g tablet Take 1 tablet (1 g total) by mouth 4 (four) times daily -  with meals and at bedtime. 90 tablet 0     Physical Exam: Blood pressure 135/78, pulse 79, temperature 97.9 F (36.6 C), temperature source Oral, resp. rate 18, height 5\' 4"  (1.626 m), weight 85.7 kg, SpO2 97 %. General: pleasant, WD, WN, mildly obese, white female who is laying in bed in NAD HEENT: head is normocephalic, atraumatic.  Sclera are noninjected.  PERRL.  Ears and nose without any masses  or lesions.  Mouth is pink and moist Heart: regular, rate, and rhythm.  Normal s1,s2. No obvious murmurs, gallops, or rubs noted.  Palpable radial and pedal pulses bilaterally Lungs: CTAB, no wheezes, rhonchi, or rales noted.  Respiratory effort nonlabored Abd: soft, NT, ND, +BS, no masses, hernias, or organomegaly MS: all 4 extremities are symmetrical with no cyanosis, clubbing, or edema. Skin: warm and dry with no masses, lesions, or rashes Neuro: Cranial nerves 2-12 grossly intact, sensation is normal throughout Psych: A&Ox3 with an appropriate affect.   Results for orders placed or performed during the hospital encounter of 09/19/20 (from the past 48 hour(s))  Resp Panel by RT-PCR (Flu A&B,  Covid) Nasopharyngeal Swab     Status: None   Collection Time: 09/20/20  2:24 PM   Specimen: Nasopharyngeal Swab; Nasopharyngeal(NP) swabs in vial transport medium  Result Value Ref Range   SARS Coronavirus 2 by RT PCR NEGATIVE NEGATIVE    Comment: (NOTE) SARS-CoV-2 target nucleic acids are NOT DETECTED.  The SARS-CoV-2 RNA is generally detectable in upper respiratory specimens during the acute phase of infection. The lowest concentration of SARS-CoV-2 viral copies this assay can detect is 138 copies/mL. A negative result does not preclude SARS-Cov-2 infection and should not be used as the sole basis for treatment or other patient management decisions. A negative result may occur with  improper specimen collection/handling, submission of specimen other than nasopharyngeal swab, presence of viral mutation(s) within the areas targeted by this assay, and inadequate number of viral copies(<138 copies/mL). A negative result must be combined with clinical observations, patient history, and epidemiological information. The expected result is Negative.  Fact Sheet for Patients:  EntrepreneurPulse.com.au  Fact Sheet for Healthcare Providers:  IncredibleEmployment.be  This test is no t yet approved or cleared by the [place] FDA and  has been authorized for detection and/or diagnosis of SARS-CoV-2 by FDA under an Emergency Use Authorization (EUA). This EUA will remain  in effect (meaning this test can be used) for the duration of the COVID-19 declaration under Section 564(b)(1) of the Act, 21 U.S.C.section 360bbb-3(b)(1), unless the authorization is terminated  or revoked sooner.       Influenza A by PCR NEGATIVE NEGATIVE   Influenza B by PCR NEGATIVE NEGATIVE    Comment: (NOTE) The Xpert Xpress SARS-CoV-2/FLU/RSV plus assay is intended as an aid in the diagnosis of influenza from Nasopharyngeal swab specimens and should not be used as a sole  basis for treatment. Nasal washings and aspirates are unacceptable for Xpert Xpress SARS-CoV-2/FLU/RSV testing.  Fact Sheet for Patients: EntrepreneurPulse.com.au  Fact Sheet for Healthcare Providers: IncredibleEmployment.be  This test is not yet approved or cleared by the [place] FDA and has been authorized for detection and/or diagnosis of SARS-CoV-2 by FDA under an Emergency Use Authorization (EUA). This EUA will remain in effect (meaning this test can be used) for the duration of the COVID-19 declaration under Section 564(b)(1) of the Act, 21 U.S.C. section 360bbb-3(b)(1), unless the authorization is terminated or revoked.  Performed at [facility], [place] [address], [place] [address]   CBC     Status: None   Collection Time: 09/21/20  1:26 AM  Result Value Ref Range   WBC 10.1 4.0 - 10.5 K/uL   RBC 4.39 3.87 - 5.11 MIL/uL   Hemoglobin 13.1 12.0 - 15.0 g/dL   HCT 39.9 36.0 - 46.0 %   MCV 90.9 80.0 - 100.0 fL   MCH 29.8 26.0 - 34.0  pg   MCHC 32.8 30.0 - 36.0 g/dL   RDW 13.8 11.5 - 15.5 %   Platelets 329 150 - 400 K/uL   nRBC 0.0 0.0 - 0.2 %    Comment: Performed at [facility], [place] [address] [place], [place] [address]  Comprehensive metabolic panel     Status: Abnormal   Collection Time: 09/21/20  1:26 AM  Result Value Ref Range   Sodium 137 135 - 145 mmol/L   Potassium 3.7 3.5 - 5.1 mmol/L   Chloride 104 98 - 111 mmol/L   CO2 27 22 - 32 mmol/L   Glucose, Bld 95 70 - 99 mg/dL    Comment: Glucose reference range applies only to samples taken after fasting for at least 8 hours.   BUN 10 8 - 23 mg/dL   Creatinine, Ser 0.87 0.44 - 1.00 mg/dL   Calcium 8.4 (L) 8.9 - 10.3 mg/dL   Total Protein 6.7 6.5 - 8.1 g/dL   Albumin 3.1 (L) 3.5 - 5.0 g/dL   AST 93 (H) 15 - 41 U/L   ALT 155 (H) 0 - 44 U/L   Alkaline Phosphatase 114 38 - 126 U/L   Total Bilirubin 1.5 (H) 0.3 - 1.2 mg/dL   GFR, Estimated >60 >60  mL/min    Comment: (NOTE) Calculated using the CKD-EPI Creatinine Equation (2021)    Anion gap 6 5 - 15    Comment: Performed at [facility], [place] [address], [place], [place] [address]  Hepatitis panel, acute     Status: None   Collection Time: 09/21/20  1:26 AM  Result Value Ref Range   Hepatitis B Surface Ag NON REACTIVE NON REACTIVE   HCV Ab NON REACTIVE NON REACTIVE    Comment: (NOTE) Nonreactive HCV antibody screen is consistent with no HCV infections,  unless recent infection is suspected or other evidence exists to indicate HCV infection.     Hep A IgM NON REACTIVE NON REACTIVE   Hep B C IgM NON REACTIVE NON REACTIVE    Comment: Performed at [facility], [place] [address], [place], [place] [address]  HIV Antibody (routine testing w rflx)     Status: None   Collection Time: 09/21/20  1:26 AM  Result Value Ref Range   HIV Screen 4th Generation wRfx Non Reactive Non Reactive    Comment: Performed at [facility], [place] [address] [place], [place] [address]  Bilirubin, direct     Status: Abnormal   Collection Time: 09/21/20  1:26 AM  Result Value Ref Range   Bilirubin, Direct 0.6 (H) 0.0 - 0.2 mg/dL    Comment: Performed at [place] [address], [place], [place] [address]  Protime-INR     Status: None   Collection Time: 09/21/20 11:05 AM  Result Value Ref Range   Prothrombin Time 13.5 11.4 - 15.2 seconds   INR 1.0 0.8 - 1.2    Comment: (NOTE) INR goal varies based on device and disease states. Performed at [facility], [place] [address], [place], [place] [address]   Comprehensive metabolic panel     Status: Abnormal   Collection Time: 09/22/20  3:35 AM  Result Value Ref Range   Sodium 137 135 - 145 mmol/L   Potassium 3.5 3.5 - 5.1 mmol/L   Chloride 105 98 - 111 mmol/L   CO2 23 22 - 32 mmol/L   Glucose, Bld 114 (H) 70 - 99 mg/dL  Comment: Glucose reference range applies only to samples taken after fasting for at least 8 hours.    BUN 10 8 - 23 mg/dL   Creatinine, Ser 0.89 0.44 - 1.00 mg/dL   Calcium 8.6 (L) 8.9 - 10.3 mg/dL   Total Protein 6.2 (L) 6.5 - 8.1 g/dL   Albumin 2.9 (L) 3.5 - 5.0 g/dL   AST 62 (H) 15 - 41 U/L   ALT 110 (H) 0 - 44 U/L   Alkaline Phosphatase 97 38 - 126 U/L   Total Bilirubin 1.1 0.3 - 1.2 mg/dL   GFR, Estimated >60 >60 mL/min    Comment: (NOTE) Calculated using the CKD-EPI Creatinine Equation (2021)    Anion gap 9 5 - 15    Comment: Performed at [facility], [place] [address] [place], [place] [address]  CBC     Status: None   Collection Time: 09/22/20  3:35 AM  Result Value Ref Range   WBC 8.3 4.0 - 10.5 K/uL   RBC 4.10 3.87 - 5.11 MIL/uL   Hemoglobin 12.3 12.0 - 15.0 g/dL   HCT 37.4 36.0 - 46.0 %   MCV 91.2 80.0 - 100.0 fL   MCH 30.0 26.0 - 34.0 pg   MCHC 32.9 30.0 - 36.0 g/dL   RDW 13.9 11.5 - 15.5 %   Platelets 312 150 - 400 K/uL   nRBC 0.0 0.0 - 0.2 %    Comment: Performed at [facility], [place] [address], [place], [place] [address]   MR ABDOMEN MRCP W WO CONTAST  Result Date: 09/20/2020 CLINICAL DATA:  Epigastric abdominal pain EXAM: MRI ABDOMEN WITHOUT AND WITH CONTRAST (INCLUDING MRCP) TECHNIQUE: Multiplanar multisequence MR imaging of the abdomen was performed both before and after the administration of intravenous contrast. Heavily T2-weighted images of the biliary and pancreatic ducts were obtained, and three-dimensional MRCP images were rendered by post processing. CONTRAST:  8.41mL GADAVIST GADOBUTROL 1 MMOL/ML IV SOLN COMPARISON:  Right upper quadrant ultrasound dated 09/19/2020. CTA abdomen/pelvis dated 08/26/2020. FINDINGS: Lower chest: Lung bases are clear. Hepatobiliary: Severe hepatic steatosis. No focal hepatic lesion is seen. Numerous gallstones (series 6/image 20), without associated inflammatory changes. No intrahepatic ductal dilatation. Common duct measures 11 mm (series 4/image 18). Two distal CBD stones measuring up to 5 mm (series 4/image 15).  Pancreas:  Within normal limits. Spleen:  Within normal limits. Adrenals/Urinary Tract:  Adrenal glands are within normal limits. Subcentimeter upper pole renal cysts.  No hydronephrosis. Stomach/Bowel: Stomach is within normal limits. Visualized bowel is unremarkable. Mild left colonic diverticulosis, without evidence of diverticulitis. Vascular/Lymphatic:  No evidence of abdominal aortic aneurysm. No suspicious abdominal lymphadenopathy. Other:  No abdominal ascites. Musculoskeletal: No focal osseous lesions. IMPRESSION: Choledocholithiasis with two distal CBD stones measuring up to 5 mm. Common duct measures 11 mm. ERCP is suggested. Numerous gallstones, without associated inflammatory changes to suggest acute cholecystitis. Severe hepatic steatosis. Electronically Signed   By: [place] Hy M.D.   On: 09/20/2020 21:31      Assessment/Plan Choledocholithiasis The patient has been having recurrent biliary colic secondary to her gallstones and this admission presents with choledocholithiasis confirmed via MRCP.  Her pain and TB has normalized, but given she had 2 stones on MRCP, GI will proceed today with ERCP.  If all goes well, we will plan for lap chole tomorrow.  NPO p MN.  Discussed all of the above with Dr. Watt Climes and Dr. Georgette Dover as well as the patient and her husband who is  now in the room.  FEN - NPO p MN VTE - Lovenox ID - Rocephin on call to OR tomorrow  GERD Neuropathy  Henreitta Cea, [facility] 09/22/2020, 9:21 AM Please see Amion for pager number during day hours 7:00am-4:30pm or 7:00am -11:30am on weekends

## 2020-09-22 NOTE — Progress Notes (Signed)
PROGRESS NOTE  Andrea Pittman [id number] DOB: [birth year] DOA: 09/19/2020 PCP: Dettinger, [name], MD   LOS: 1 day   Brief Narrative / Interim history: 65 year old female with anxiety, depression, GERD comes into the hospital with right upper quadrant/bandlike abdominal pain 3 days prior.  The pain was worse, sometimes focusing in the right upper quadrant, with tenderness to palpation in that area.  She is also experienced nausea, vomiting and has had poor p.o. intake over the last few days.  In the ED an ultrasound revealed cholelithiasis with no acute findings of cholecystitis and an enlarged CBD.  An MRI showed choledocholithiasis and GI was consulted  Subjective / 24h Interval events: Feeling well, no abdominal pain, no nausea or vomiting.  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  She would benefit from weight loss  Scheduled Meds:  DULoxetine  120 mg Oral Daily   enoxaparin (LOVENOX) injection  40 mg Subcutaneous Q24H   famotidine  40 mg Oral Daily   pantoprazole  40 mg Oral QHS   pregabalin  100 mg Oral BID   sodium chloride flush  3 mL Intravenous Q12H   sucralfate  1 g Oral TID WC & HS   Continuous Infusions:  sodium chloride 75 mL/hr at 09/21/20 2000   sodium chloride     [START ON 09/23/2020] cefTRIAXone (ROCEPHIN)  IV     piperacillin-tazobactam     PRN Meds:.albuterol, ketorolac, ondansetron **OR** ondansetron (ZOFRAN) IV  Diet Orders (From admission, onward)     Start     Ordered   09/23/20 0001   Diet NPO time specified  Diet effective midnight        09/22/20 0942   09/22/20 0538  Diet NPO time specified  Diet effective now        09/22/20 0537            DVT prophylaxis: enoxaparin (LOVENOX) injection 40 mg Start: 09/20/20 2200     Code Status: Full Code  Family Communication: No family at bedside  Status is: Inpatient  Remains inpatient appropriate because:Inpatient level of care appropriate due to severity of illness  Dispo: The patient is from: Home              Anticipated d/c is to: Home              Patient currently is not medically stable to d/c.   Difficult to place patient No   Level of care: Med-Surg  Consultants:  GI  Procedures:  ERCP - pending  Microbiology  None   Antimicrobials: None     Objective: Vitals:   09/21/20 1127 09/21/20 1638 09/22/20 0003 09/22/20 0549  BP: (!) 143/78 118/71 131/76 135/78  Pulse: 79 78 81 79  Resp: '18 18 18 18  '$ Temp: 98.2 F (36.8 C) 98.2 F (36.8 C) 98 F (36.7 C) 97.9 F (36.6 C)  TempSrc: Oral Oral Oral Oral  SpO2: 94% 95% 96% 97%  Weight:      Height:        Intake/Output Summary (Last 24 hours) at 09/22/2020 0946 Last data filed at 09/21/2020 2000 Gross per 24 hour  Intake 1895.66 ml  Output --  Net 1895.66 ml    Filed Weights   09/20/20 1845  Weight: 85.7 kg    Examination:  Constitutional: No distress Eyes: no scleral icterus ENMT: mmm Neck: normal, supple Respiratory: Clear bilaterally, no wheezing or crackles Cardiovascular: Regular rate and rhythm, no murmurs, no edema Abdomen: Soft, NT, ND, bowel sounds positive Musculoskeletal: no clubbing / cyanosis.  Skin: No rashes seen Neurologic: No focal deficits   Data Reviewed: I have independently reviewed following labs and imaging studies  CBC: Recent Labs  Lab 09/19/20 2151 09/21/20 0126 09/22/20 0335  WBC 13.3* 10.1 8.3  NEUTROABS 10.2*  --   --   HGB 14.8 13.1 12.3  HCT 44.8 39.9 37.4  MCV 90.3 90.9 91.2   PLT 419* 329 [id number]    Basic Metabolic Panel: Recent Labs  Lab 09/19/20 2151 09/21/20 0126 09/22/20 0335  NA 134* 137 137  K 3.4* 3.7 3.5  CL 97* 104 105  CO2 '27 27 23  '$ GLUCOSE 169* 95 114*  BUN '8 10 10  '$ CREATININE 0.83 0.87 0.89  CALCIUM 8.9 8.4* 8.6*    Liver Function Tests: Recent Labs  Lab 09/19/20 2151 09/21/20 0126 09/22/20 0335  AST 251* 93* 62*  ALT 228* 155* 110*  ALKPHOS 132* 114 97  BILITOT 3.6* 1.5* 1.1  PROT 8.3* 6.7 6.2*  ALBUMIN 3.9 3.1* 2.9*    Coagulation Profile: Recent Labs  Lab 09/21/20 1105  INR 1.0   HbA1C: No results for input(s): HGBA1C in the last 72 hours. CBG: No results for input(s): GLUCAP in the last 168 hours.  Recent Results (from the past 240 hour(s))  Resp Panel by RT-PCR (Flu A&B, Covid) Nasopharyngeal Swab     Status: None   Collection Time: 09/20/20  2:24 PM   Specimen: Nasopharyngeal Swab; Nasopharyngeal(NP) swabs in vial transport medium  Result Value Ref Range Status   SARS Coronavirus 2 by RT PCR NEGATIVE NEGATIVE Final    Comment: (NOTE) SARS-CoV-2 target nucleic acids are NOT DETECTED.  The SARS-CoV-2 RNA is generally detectable in upper respiratory specimens during the acute phase of infection. The lowest concentration of SARS-CoV-2 viral copies this assay can detect is 138 copies/mL. A negative result does not preclude SARS-Cov-2 infection and should not be used as the sole basis for treatment or other patient management decisions. A negative result may occur with  improper specimen collection/handling, submission of specimen other than nasopharyngeal swab, presence of viral mutation(s) within the areas targeted by this assay, and inadequate number of viral copies(<138 copies/mL). A negative result must be combined with clinical observations, patient history, and epidemiological information. The expected result is Negative.  Fact Sheet for Patients:  EntrepreneurPulse.com.au  Fact Sheet  for Healthcare Providers:  IncredibleEmployment.be  This test is no t yet approved or cleared by the [place] FDA and  has been authorized for detection and/or diagnosis of SARS-CoV-2 by FDA under an Emergency Use Authorization (EUA). This EUA will remain  in effect (meaning this test can be used) for the duration of the COVID-19 declaration under Section 564(b)(1) of the Act, 21 U.S.C.section 360bbb-3(b)(1), unless the authorization is terminated  or revoked sooner.       Influenza A by PCR NEGATIVE NEGATIVE Final   Influenza B by PCR NEGATIVE NEGATIVE Final    Comment: (NOTE) The Xpert Xpress SARS-CoV-2/FLU/RSV plus assay is intended as an aid in the diagnosis of influenza from Nasopharyngeal swab specimens and should not be used as a  sole basis for treatment. Nasal washings and aspirates are unacceptable for Xpert Xpress SARS-CoV-2/FLU/RSV testing.  Fact Sheet for Patients: EntrepreneurPulse.com.au  Fact Sheet for Healthcare Providers: IncredibleEmployment.be  This test is not yet approved or cleared by the [place] FDA and has been authorized for detection and/or diagnosis of SARS-CoV-2 by FDA under an Emergency Use Authorization (EUA). This EUA will remain in effect (meaning this test can be used) for the duration of the COVID-19 declaration under Section 564(b)(1) of the Act, 21 U.S.C. section 360bbb-3(b)(1), unless the authorization is terminated or revoked.  Performed at [facility], [place] [address], [place], [place] [address]       Radiology Studies: No results found.   Marzetta Board, MD, PhD [facility]  Between 7 am - 7 pm I am available, please contact me via Amion (for emergencies) or Securechat (non urgent messages)  Between 7 pm - 7 am I am not available, please contact night coverage MD/APP via Amion

## 2020-09-22 NOTE — Op Note (Signed)
[facility] Patient Name: Andrea Pittman Procedure Date : 09/22/2020 MRN: [id number] Attending MD: Clarene Essex , MD Date of Birth: 1955-12-02 CSN: [id number] Age: 65 Admit Type: Inpatient Procedure:                ERCP Indications:              For therapy of bile duct stone(s) Providers:                Clarene Essex, MD, Glori Bickers, RN, Tyrone Apple,                            Technician, Claybon Jabs CRNA, CRNA Referring MD:              Medicines:                General Anesthesia Complications:            No immediate complications. Estimated Blood Loss:     Estimated blood loss: none. Procedure:                Pre-Anesthesia Assessment:                           - Prior to the procedure, a History and Physical                            was performed, and patient medications and                            allergies were reviewed. The patient's tolerance of                            previous anesthesia was also reviewed. The risks                            and benefits of the procedure and the sedation                            options and risks were discussed with the patient.                            All questions were answered, and informed consent                            was obtained. Prior Anticoagulants: The patient has                            taken no previous anticoagulant or antiplatelet                            agents. ASA Grade Assessment: II - A patient with                            mild systemic disease. After reviewing the risks  and benefits, the patient was deemed in                            satisfactory condition to undergo the procedure.                           After obtaining informed consent, the scope was                            passed under direct vision. Throughout the                            procedure, the patient's blood pressure, pulse, and                            oxygen saturations were  monitored continuously. The                            TJF- Q180V ([id number]) Olympus duodenoscope was                            introduced through the mouth, and used to inject                            contrast into and used to locate the major papilla.                            The ERCP was accomplished without difficulty. The                            patient tolerated the procedure well. Scope In: Scope Out: Findings:      The major papilla was normal. Deep selective cannulation was readily       obtained and there was no pancreatic duct injection or wire advancement       throughout the procedure and at least 1 obvious stone was seen on       initial cholangiogram and we proceeded with a biliary sphincterotomy was       made with a Hydratome sphincterotome using ERBE electrocautery. There       was no post-sphincterotomy bleeding. We proceeded until we had adequate       biliary drainage and could easily get the fully bowed sphincterotome       easily in and out of the duct and choledocholithiasis was found in a       mildly dilated duct. To discover objects and remove them, the biliary       tree was swept with an adjustable 12- 15 mm balloon starting at the       bifurcation. All stones were removed. Nothing was found at the end of       the procedure on occlusion cholangiogram and multiple balloon       pull-through's were done using both the 12 and 15 mm balloon and both       passed readily through the pain sphincterotomy site and there was       adequate biliary drainage and we elected to stop the procedure  at this       point. There were multiple stones seen on gallbladder filling in the       gallbladder Impression:               - The major papilla appeared normal.                           - Choledocholithiasis was found. Complete removal                            was accomplished by biliary sphincterotomy and                            balloon extraction.                            - A biliary sphincterotomy was performed.                           - The biliary tree was swept multiple times at the                            end of the procedure and nothing was found. Recommendation:           - Clear liquid diet for 6 hours. If doing well this                            evening and okay with surgical team may have soft                            solids but n.p.o. after midnight for probable lap                            chole tomorrow                           - Continue present medications.                           - Check liver enzymes (AST, ALT, alkaline                            phosphatase, bilirubin) tomorrow. And follow back                            to normal as an outpatient                           - Return to GI clinic PRN.                           - Telephone GI clinic if symptomatic PRN. Procedure Code(s):        --- Professional ---                           [phone], Esophagogastroduodenoscopy,  flexible,                            transoral; diagnostic, including collection of                            specimen(s) by brushing or washing, when performed                            (separate procedure) Diagnosis Code(s):        --- Professional ---                           K80.50, Calculus of bile duct without cholangitis                            or cholecystitis without obstruction CPT copyright 2019 [employer] Association. All rights reserved. The codes documented in this report are preliminary and upon coder review may  be revised to meet current compliance requirements. Clarene Essex, MD 09/22/2020 11:38:04 AM This report has been signed electronically. Number of Addenda: 0

## 2020-09-23 ENCOUNTER — Inpatient Hospital Stay (HOSPITAL_COMMUNITY): Payer: Medicare Other

## 2020-09-23 ENCOUNTER — Inpatient Hospital Stay (HOSPITAL_COMMUNITY): Payer: Medicare Other | Admitting: Certified Registered Nurse Anesthetist

## 2020-09-23 ENCOUNTER — Encounter (HOSPITAL_COMMUNITY)
Admission: EM | Disposition: A | Discharge status: Home / Self Care | Payer: Self-pay | Source: Home / Self Care | Attending: Internal Medicine

## 2020-09-23 DIAGNOSIS — K8010 Calculus of gallbladder with chronic cholecystitis without obstruction: Secondary | ICD-10-CM | POA: Diagnosis not present

## 2020-09-23 DIAGNOSIS — K8051 Calculus of bile duct without cholangitis or cholecystitis with obstruction: Secondary | ICD-10-CM

## 2020-09-23 HISTORY — PX: CHOLECYSTECTOMY: SHX55

## 2020-09-23 LAB — COMPREHENSIVE METABOLIC PANEL
ALT: 150 U/L — ABNORMAL HIGH (ref 0–44)
AST: 140 U/L — ABNORMAL HIGH (ref 15–41)
Albumin: 3 g/dL — ABNORMAL LOW (ref 3.5–5.0)
Alkaline Phosphatase: 110 U/L (ref 38–126)
Anion gap: 7 (ref 5–15)
BUN: 10 mg/dL (ref 8–23)
CO2: 27 mmol/L (ref 22–32)
Calcium: 8.8 mg/dL — ABNORMAL LOW (ref 8.9–10.3)
Chloride: 106 mmol/L (ref 98–111)
Creatinine, Ser: 0.94 mg/dL (ref 0.44–1.00)
GFR, Estimated: >60 mL/min (ref >60)
Glucose, Bld: 121 mg/dL — ABNORMAL HIGH (ref 70–99)
Potassium: 3.5 mmol/L (ref 3.5–5.1)
Sodium: 140 mmol/L (ref 135–145)
Total Bilirubin: 1.7 mg/dL — ABNORMAL HIGH (ref 0.3–1.2)
Total Protein: 6.6 g/dL (ref 6.5–8.1)

## 2020-09-23 LAB — CBC
HCT: 37.6 % (ref 36.0–46.0)
Hemoglobin: 12.6 g/dL (ref 12.0–15.0)
MCH: 30.4 pg (ref 26.0–34.0)
MCHC: 33.5 g/dL (ref 30.0–36.0)
MCV: 90.6 fL (ref 80.0–100.0)
Platelets: 338 10*3/uL (ref 150–400)
RBC: 4.15 MIL/uL (ref 3.87–5.11)
RDW: 13.8 % (ref 11.5–15.5)
WBC: 13.1 10*3/uL — ABNORMAL HIGH (ref 4.0–10.5)
nRBC: 0 % (ref 0.0–0.2)

## 2020-09-23 LAB — SURGICAL PCR SCREEN
MRSA, PCR: NEGATIVE
Staphylococcus aureus: NEGATIVE

## 2020-09-23 SURGERY — LAPAROSCOPIC CHOLECYSTECTOMY WITH INTRAOPERATIVE CHOLANGIOGRAM
Anesthesia: General | Site: Abdomen

## 2020-09-23 MED ORDER — ROCURONIUM BROMIDE 10 MG/ML (PF) SYRINGE
PREFILLED_SYRINGE | INTRAVENOUS | Status: AC
  Filled 2020-09-23: qty 10

## 2020-09-23 MED ORDER — ONDANSETRON HCL 4 MG/2ML IJ SOLN
INTRAMUSCULAR | Status: DC | PRN
  Administered 2020-09-23: 4 mg via INTRAVENOUS

## 2020-09-23 MED ORDER — ACETAMINOPHEN 500 MG PO TABS
1000.0000 mg | ORAL_TABLET | Freq: Four times a day (QID) | ORAL | Status: DC
  Administered 2020-09-23: 1000 mg via ORAL
  Filled 2020-09-23: qty 2

## 2020-09-23 MED ORDER — PHENYLEPHRINE HCL-NACL 10-0.9 MG/250ML-% IV SOLN
INTRAVENOUS | Status: DC | PRN
  Administered 2020-09-23: 25 ug/min via INTRAVENOUS

## 2020-09-23 MED ORDER — BUPIVACAINE-EPINEPHRINE 0.25% -1:200000 IJ SOLN
INTRAMUSCULAR | Status: DC | PRN
  Administered 2020-09-23: 13 mL

## 2020-09-23 MED ORDER — CHLORHEXIDINE GLUCONATE 0.12 % MT SOLN: 15.0000 mL | Freq: Once | OROMUCOSAL | Status: AC

## 2020-09-23 MED ORDER — LACTATED RINGERS IV SOLN: INTRAVENOUS | Status: DC | PRN

## 2020-09-23 MED ORDER — SUGAMMADEX SODIUM 200 MG/2ML IV SOLN
INTRAVENOUS | Status: DC | PRN
  Administered 2020-09-23: 350 mg via INTRAVENOUS

## 2020-09-23 MED ORDER — ONDANSETRON HCL 4 MG/2ML IJ SOLN
INTRAMUSCULAR | Status: AC
  Filled 2020-09-23: qty 2

## 2020-09-23 MED ORDER — CHLORHEXIDINE GLUCONATE 0.12 % MT SOLN
OROMUCOSAL | Status: AC
  Administered 2020-09-23: 15 mL via OROMUCOSAL
  Filled 2020-09-23: qty 15

## 2020-09-23 MED ORDER — SODIUM CHLORIDE 0.9 % IR SOLN
Status: DC | PRN
  Administered 2020-09-23: 1000 mL

## 2020-09-23 MED ORDER — MIDAZOLAM HCL 2 MG/2ML IJ SOLN
INTRAMUSCULAR | Status: AC
  Filled 2020-09-23: qty 2

## 2020-09-23 MED ORDER — SODIUM CHLORIDE 0.9 % IV SOLN: INTRAVENOUS | Status: DC

## 2020-09-23 MED ORDER — PROPOFOL 10 MG/ML IV BOLUS
INTRAVENOUS | Status: DC | PRN
  Administered 2020-09-23: 150 mg via INTRAVENOUS

## 2020-09-23 MED ORDER — FENTANYL CITRATE (PF) 250 MCG/5ML IJ SOLN
INTRAMUSCULAR | Status: AC
  Filled 2020-09-23: qty 5

## 2020-09-23 MED ORDER — DEXAMETHASONE SODIUM PHOSPHATE 10 MG/ML IJ SOLN
INTRAMUSCULAR | Status: AC
  Filled 2020-09-23: qty 1

## 2020-09-23 MED ORDER — ORAL CARE MOUTH RINSE: 15.0000 mL | Freq: Once | OROMUCOSAL | Status: AC

## 2020-09-23 MED ORDER — MORPHINE SULFATE (PF) 2 MG/ML IV SOLN: 2.0000 mg | INTRAVENOUS | Status: DC | PRN

## 2020-09-23 MED ORDER — PHENYLEPHRINE HCL (PRESSORS) 10 MG/ML IV SOLN
INTRAVENOUS | Status: DC | PRN
  Administered 2020-09-23: 80 ug via INTRAVENOUS

## 2020-09-23 MED ORDER — BUPIVACAINE-EPINEPHRINE (PF) 0.25% -1:200000 IJ SOLN
INTRAMUSCULAR | Status: AC
  Filled 2020-09-23: qty 30

## 2020-09-23 MED ORDER — LIDOCAINE 2% (20 MG/ML) 5 ML SYRINGE
INTRAMUSCULAR | Status: DC | PRN
  Administered 2020-09-23: 80 mg via INTRAVENOUS

## 2020-09-23 MED ORDER — MIDAZOLAM HCL 2 MG/2ML IJ SOLN
INTRAMUSCULAR | Status: DC | PRN
  Administered 2020-09-23: 2 mg via INTRAVENOUS

## 2020-09-23 MED ORDER — KETOROLAC TROMETHAMINE 30 MG/ML IJ SOLN
INTRAMUSCULAR | Status: AC
  Filled 2020-09-23: qty 1

## 2020-09-23 MED ORDER — BUPIVACAINE-EPINEPHRINE 0.5% -1:200000 IJ SOLN
INTRAMUSCULAR | Status: AC
  Filled 2020-09-23: qty 1

## 2020-09-23 MED ORDER — OXYCODONE HCL 5 MG PO TABS
5.0000 mg | ORAL_TABLET | ORAL | Status: DC | PRN
Start: 2020-09-23 — End: 2020-09-23

## 2020-09-23 MED ORDER — KETOROLAC TROMETHAMINE 30 MG/ML IJ SOLN
INTRAMUSCULAR | Status: DC | PRN
  Administered 2020-09-23: 30 mg via INTRAVENOUS

## 2020-09-23 MED ORDER — DEXAMETHASONE SODIUM PHOSPHATE 10 MG/ML IJ SOLN
INTRAMUSCULAR | Status: DC | PRN
  Administered 2020-09-23: 10 mg via INTRAVENOUS

## 2020-09-23 MED ORDER — 0.9 % SODIUM CHLORIDE (POUR BTL) OPTIME
TOPICAL | Status: DC | PRN
  Administered 2020-09-23: 1000 mL

## 2020-09-23 MED ORDER — PHENYLEPHRINE 40 MCG/ML (10ML) SYRINGE FOR IV PUSH (FOR BLOOD PRESSURE SUPPORT)
PREFILLED_SYRINGE | INTRAVENOUS | Status: AC
  Filled 2020-09-23: qty 10

## 2020-09-23 MED ORDER — ACETAMINOPHEN 10 MG/ML IV SOLN
INTRAVENOUS | Status: DC | PRN
  Administered 2020-09-23: 1000 mg via INTRAVENOUS

## 2020-09-23 MED ORDER — CEFAZOLIN SODIUM 1 G IJ SOLR
INTRAMUSCULAR | Status: AC
  Filled 2020-09-23: qty 20

## 2020-09-23 MED ORDER — FENTANYL CITRATE (PF) 250 MCG/5ML IJ SOLN
INTRAMUSCULAR | Status: DC | PRN
  Administered 2020-09-23 (×2): 50 ug via INTRAVENOUS
  Administered 2020-09-23: 100 ug via INTRAVENOUS

## 2020-09-23 MED ORDER — PHENYLEPHRINE HCL (PRESSORS) 10 MG/ML IV SOLN
INTRAVENOUS | Status: AC
  Filled 2020-09-23: qty 1

## 2020-09-23 MED ORDER — ROCURONIUM BROMIDE 10 MG/ML (PF) SYRINGE
PREFILLED_SYRINGE | INTRAVENOUS | Status: DC | PRN
  Administered 2020-09-23: 50 mg via INTRAVENOUS

## 2020-09-23 MED ORDER — BUPIVACAINE HCL (PF) 0.25 % IJ SOLN
INTRAMUSCULAR | Status: AC
  Filled 2020-09-23: qty 30

## 2020-09-23 MED ORDER — ACETAMINOPHEN 500 MG PO TABS: 1000.0000 mg | ORAL_TABLET | Freq: Four times a day (QID) | ORAL | 0 refills | Status: AC | PRN

## 2020-09-23 MED ORDER — PROPOFOL 1000 MG/100ML IV EMUL
INTRAVENOUS | Status: AC
  Filled 2020-09-23: qty 100

## 2020-09-23 MED ORDER — FENTANYL CITRATE (PF) 100 MCG/2ML IJ SOLN: 25.0000 ug | INTRAMUSCULAR | Status: DC | PRN

## 2020-09-23 MED ORDER — LIDOCAINE 2% (20 MG/ML) 5 ML SYRINGE
INTRAMUSCULAR | Status: AC
  Filled 2020-09-23: qty 5

## 2020-09-23 MED ORDER — OXYCODONE HCL 5 MG PO TABS: 5.0000 mg | ORAL_TABLET | ORAL | 0 refills | Status: DC | PRN

## 2020-09-23 MED ORDER — ACETAMINOPHEN 10 MG/ML IV SOLN
INTRAVENOUS | Status: AC
  Filled 2020-09-23: qty 100

## 2020-09-23 SURGICAL SUPPLY — 50 items
APL PRP STRL LF DISP 70% ISPRP (MISCELLANEOUS) ×1
APL SKNCLS STERI-STRIP NONHPOA (GAUZE/BANDAGES/DRESSINGS) ×1
APPLIER CLIP ROT 10 11.4 M/L (STAPLE) ×2
APR CLP MED LRG 11.4X10 (STAPLE) ×1
BAG COUNTER SPONGE SURGICOUNT (BAG) ×2 IMPLANT
BAG SPEC RTRVL 10 TROC 200 (ENDOMECHANICALS) ×1
BAG SPEC RTRVL LRG 6X4 10 (ENDOMECHANICALS)
BAG SPNG CNTER NS LX DISP (BAG) ×1
BENZOIN TINCTURE PRP APPL 2/3 (GAUZE/BANDAGES/DRESSINGS) ×2 IMPLANT
BLADE CLIPPER SURG (BLADE) IMPLANT
CANISTER SUCT 3000ML PPV (MISCELLANEOUS) ×2 IMPLANT
CHLORAPREP W/TINT 26 (MISCELLANEOUS) ×2 IMPLANT
CLIP APPLIE ROT 10 11.4 M/L (STAPLE) ×1 IMPLANT
COVER MAYO STAND STRL (DRAPES) ×2 IMPLANT
COVER SURGICAL LIGHT HANDLE (MISCELLANEOUS) ×2 IMPLANT
DRAPE C-ARM 42X120 X-RAY (DRAPES) ×2 IMPLANT
DRSG TEGADERM 2-3/8X2-3/4 SM (GAUZE/BANDAGES/DRESSINGS) ×6 IMPLANT
DRSG TEGADERM 4X4.75 (GAUZE/BANDAGES/DRESSINGS) ×2 IMPLANT
ELECT REM PT RETURN 9FT ADLT (ELECTROSURGICAL) ×2
ELECTRODE REM PT RTRN 9FT ADLT (ELECTROSURGICAL) ×1 IMPLANT
ENDOLOOP SUT PDS II  0 18 (SUTURE) ×2
ENDOLOOP SUT PDS II 0 18 (SUTURE) IMPLANT
GAUZE SPONGE 2X2 8PLY STRL LF (GAUZE/BANDAGES/DRESSINGS) ×1 IMPLANT
GLOVE SURG ENC MOIS LTX SZ7 (GLOVE) ×2 IMPLANT
GLOVE SURG UNDER POLY LF SZ7.5 (GLOVE) ×2 IMPLANT
GOWN STRL REUS W/ TWL LRG LVL3 (GOWN DISPOSABLE) ×3 IMPLANT
GOWN STRL REUS W/TWL LRG LVL3 (GOWN DISPOSABLE) ×6
KIT BASIN OR (CUSTOM PROCEDURE TRAY) ×2 IMPLANT
KIT TURNOVER KIT B (KITS) ×2 IMPLANT
NS IRRIG 1000ML POUR BTL (IV SOLUTION) ×2 IMPLANT
PAD ARMBOARD 7.5X6 YLW CONV (MISCELLANEOUS) ×2 IMPLANT
POUCH RETRIEVAL ECOSAC 10 (ENDOMECHANICALS) IMPLANT
POUCH RETRIEVAL ECOSAC 10MM (ENDOMECHANICALS) ×2
POUCH SPECIMEN RETRIEVAL 10MM (ENDOMECHANICALS) IMPLANT
SCISSORS LAP 5X35 DISP (ENDOMECHANICALS) ×2 IMPLANT
SET CHOLANGIOGRAPH 5 50 .035 (SET/KITS/TRAYS/PACK) ×1 IMPLANT
SET IRRIG TUBING LAPAROSCOPIC (IRRIGATION / IRRIGATOR) ×2 IMPLANT
SET TUBE SMOKE EVAC HIGH FLOW (TUBING) ×2 IMPLANT
SLEEVE ENDOPATH XCEL 5M (ENDOMECHANICALS) ×2 IMPLANT
SPECIMEN JAR SMALL (MISCELLANEOUS) ×2 IMPLANT
SPONGE GAUZE 2X2 STER 10/PKG (GAUZE/BANDAGES/DRESSINGS) ×1
STRIP CLOSURE SKIN 1/2X4 (GAUZE/BANDAGES/DRESSINGS) ×2 IMPLANT
SUT MNCRL AB 4-0 PS2 18 (SUTURE) ×2 IMPLANT
TOWEL GREEN STERILE (TOWEL DISPOSABLE) ×2 IMPLANT
TOWEL GREEN STERILE FF (TOWEL DISPOSABLE) ×2 IMPLANT
TRAY LAPAROSCOPIC MC (CUSTOM PROCEDURE TRAY) ×2 IMPLANT
TROCAR XCEL BLUNT TIP 100MML (ENDOMECHANICALS) ×2 IMPLANT
TROCAR XCEL NON-BLD 11X100MML (ENDOMECHANICALS) ×2 IMPLANT
TROCAR XCEL NON-BLD 5MMX100MML (ENDOMECHANICALS) ×2 IMPLANT
WATER STERILE IRR 1000ML POUR (IV SOLUTION) ×2 IMPLANT

## 2020-09-23 NOTE — Progress Notes (Signed)
Patient ID: Andrea Pittman, female   DOB: 1955-09-15, 65 y.o.   MRN: [id number]  Patient is currently asymptomatic after her ERCP yesterday.  Several stones were removed from the CBD.    Proceed with lap cholecystectomy today.  The surgical procedure has been discussed with the patient.  Potential risks, benefits, alternative treatments, and expected outcomes have been explained.  All of the patient's questions at this time have been answered.  The likelihood of reaching the patient's treatment goal is good.  The patient understand the proposed surgical procedure and wishes to proceed.   [name]. Georgette Dover, MD, [facility]  General Surgery   09/23/2020 7:31 AM

## 2020-09-23 NOTE — Anesthesia Procedure Notes (Signed)
Procedure Name: Intubation Date/Time: 09/23/2020 8:04 AM Performed by: Clearnce Sorrel, CRNA Pre-anesthesia Checklist: Patient identified, Emergency Drugs available, Suction available and Patient being monitored Patient Re-evaluated:Patient Re-evaluated prior to induction Oxygen Delivery Method: Circle System Utilized Preoxygenation: Pre-oxygenation with 100% oxygen Induction Type: IV induction Ventilation: Mask ventilation without difficulty Laryngoscope Size: Mac and 3 Grade View: Grade I Tube type: Oral Tube size: 7.0 mm Number of attempts: 1 Airway Equipment and Method: Stylet and Oral airway Placement Confirmation: ETT inserted through vocal cords under direct vision, positive ETCO2 and breath sounds checked- equal and bilateral Secured at: 23 cm Tube secured with: Tape Dental Injury: Teeth and Oropharynx as per pre-operative assessment

## 2020-09-23 NOTE — Transfer of Care (Signed)
Immediate Anesthesia Transfer of Care Note  Patient: GLORIBEL MUNDT  Procedure(s) Performed: LAPAROSCOPIC CHOLECYSTECTOMY (Abdomen)  Patient Location: PACU  Anesthesia Type:General  Level of Consciousness: sedated  Airway & Oxygen Therapy: Patient Spontanous Breathing and Patient connected to face mask oxygen  Post-op Assessment: Report given to RN and Post -op Vital signs reviewed and stable  Post vital signs: Reviewed and stable  Last Vitals:  Vitals Value Taken Time  BP    Temp    Pulse 85 09/23/20 0929  Resp 26 09/23/20 0929  SpO2 100 % 09/23/20 0929  Vitals shown include unvalidated device data.  Last Pain:  Vitals:   09/23/20 0419  TempSrc: Oral  PainSc:          Complications: No notable events documented.

## 2020-09-23 NOTE — Progress Notes (Signed)
RN gave patient Discharge instructions and she stated understanding. New medications escribed to home pharmacy and pt packed all belongings, IV has been removed husband at bedside for DC.

## 2020-09-23 NOTE — Anesthesia Postprocedure Evaluation (Signed)
Anesthesia Post Note  Patient: Andrea Pittman  Procedure(s) Performed: LAPAROSCOPIC CHOLECYSTECTOMY (Abdomen)     Patient location during evaluation: PACU Anesthesia Type: General Level of consciousness: awake Pain management: pain level controlled Vital Signs Assessment: post-procedure vital signs reviewed and stable Respiratory status: spontaneous breathing Cardiovascular status: stable Postop Assessment: no apparent nausea or vomiting Anesthetic complications: no   No notable events documented.  Last Vitals:  Vitals:   09/23/20 1057 09/23/20 1138  BP: (!) 153/85 140/85  Pulse: 92 87  Resp: 16 16  Temp: 36.5 C 36.9 C  SpO2: 94% 94%    Last Pain:  Vitals:   09/23/20 1138  TempSrc: Oral  PainSc: 0-No pain                 [name]

## 2020-09-23 NOTE — Op Note (Signed)
Laparoscopic Cholecystectomy Procedure Note  Indications: This is a pleasant otherwise healthy female with only GERD and idiopathic neuropathy, who began having some epigastric abdominal pain radiating to her chest in late June.  She has reflux, but this felt different.  She presented to the ED where her heart was evaluated and no acute findings.  She was asked to follow up with GI.  This appointment was scheduled for September.  Unfortunately, her symptoms returned on Sunday with a band like pain across her entire upper abdomen.  On Monday she developed N/V.  She denies any fevers, chills, SOB, dysuria, but admits to dark urine which has cleared up.  She presented to the [facility] where she was noted to have elevated TB of 3.3 and a dilated CBD.  She then underwent MRCP which revealed 2 46m distal CBD stones.  Her urine has cleared up, pain resolved, and TB normalized.  She underwent successful ERCP yesterday and presents now for cholecystectomy.  Pre-operative Diagnosis: Calculus of gallbladder with other cholecystitis and obstruction  Post-operative Diagnosis: Same  Surgeon: MMaia Petties  Assistants: PPryor Curia RNFA  Anesthesia: General endotracheal anesthesia  ASA Class: 2  Procedure Details  The patient was seen again in the Holding Room. The risks, benefits, complications, treatment options, and expected outcomes were discussed with the patient. The possibilities of reaction to medication, pulmonary aspiration, perforation of viscus, bleeding, recurrent infection, finding a normal gallbladder, the need for additional procedures, failure to diagnose a condition, the possible need to convert to an open procedure, and creating a complication requiring transfusion or operation were discussed with the patient. The likelihood of improving the patient's symptoms with return to their baseline status is good.  The patient and/or family concurred with the proposed plan, giving informed consent. The  site of surgery properly noted. The patient was taken to Operating Room, identified as JFERNE LEVERTONand the procedure verified as Laparoscopic Cholecystectomy with Intraoperative Cholangiogram. A Time Out was held and the above information confirmed.  Prior to the induction of general anesthesia, antibiotic prophylaxis was administered. General endotracheal anesthesia was then administered and tolerated well. After the induction, the abdomen was prepped with Chloraprep and draped in sterile fashion. The patient was positioned in the supine position.  Local anesthetic agent was injected into the skin near the umbilicus and an incision made. We dissected down to the abdominal fascia with blunt dissection.  The fascia was incised vertically and we entered the peritoneal cavity bluntly.  A pursestring suture of 0-Vicryl was placed around the fascial opening.  The Hasson cannula was inserted and secured with the stay suture.  Pneumoperitoneum was then created with CO2 and tolerated well without any adverse changes in the patient's vital signs. An 11-mm port was placed in the subxiphoid position.  Two 5-mm ports were placed in the right upper quadrant. All skin incisions were infiltrated with a local anesthetic agent before making the incision and placing the trocars.   We positioned the patient in reverse Trendelenburg, tilted slightly to the patient's left.  The gallbladder was identified, the fundus grasped and retracted cephalad. There are omental adhesions to the surface of the gallbladder.  Adhesions were lysed bluntly and with the electrocautery where indicated, taking care not to injure any adjacent organs or viscus. The infundibulum was grasped and retracted laterally, exposing the peritoneum overlying the triangle of Calot. This was then divided and exposed in a blunt fashion. The cystic duct was clearly identified and bluntly dissected circumferentially.  The cystic duct is relatively large in diameter,  which is consistent with her choledocholithiasis.  A critical view of the cystic duct and cystic artery was obtained.  The cystic duct was then ligated with clips and divided. An endoloop was used to reinforce the ligation of the cystic duct.  The cystic artery was, dissected free, ligated with clips and divided as well.   The gallbladder was dissected from the liver bed in retrograde fashion with the electrocautery. The gallbladder was removed and placed in an Endocatch sac. The liver bed was irrigated and inspected. Hemostasis was achieved with the electrocautery. Copious irrigation was utilized and was repeatedly aspirated until clear.  The gallbladder and Endocatch sac were then removed through the umbilical port site.  The pursestring suture was used to close the umbilical fascia.    We again inspected the right upper quadrant for hemostasis.  Pneumoperitoneum was released as we removed the trocars.  4-0 Monocryl was used to close the skin.   Benzoin, steri-strips, and clean dressings were applied. The patient was then extubated and brought to the recovery room in stable condition. Instrument, sponge, and needle counts were correct at closure and at the conclusion of the case.   Findings: Cholecystitis with Cholelithiasis  Estimated Blood Loss: Minimal         Drains: none         Specimens: Gallbladder           Complications: None; patient tolerated the procedure well.         Disposition: PACU - hemodynamically stable.         Condition: stable  [name]. Georgette Dover, MD, [facility] Surgery  General Surgery   09/23/2020 9:34 AM

## 2020-09-23 NOTE — Progress Notes (Signed)
[facility] Gastroenterology Progress Note  Andrea Pittman 65 y.o. Jan 06, 1956  CC: Abdominal pain, choledocholithiasis   Subjective: Patient seen and examined at bedside.  She underwent laparoscopic cholecystectomy today.  No new GI symptoms.  Mild abdominal discomfort  ROS : Afebrile, negative for chest pain   Objective: Vital signs in last 24 hours: Vitals:   09/23/20 1057 09/23/20 1138  BP: (!) 153/85 140/85  Pulse: 92 87  Resp: 16 16  Temp: 97.7 F (36.5 C) 98.4 F (36.9 C)  SpO2: 94% 94%    Physical Exam:  General:  Alert, cooperative, no distress, appears stated age  Head:  Normocephalic, without obvious abnormality, atraumatic  Eyes:  , EOM's intact,   Lungs:   Clear to auscultation bilaterally, respirations unlabored  Heart:  Regular rate and rhythm, S1, S2 normal  Abdomen:   Soft, non-tender, nondistended, bowel sounds present.  No peritoneal signs  Extremities: Extremities normal, atraumatic, no  edema  Pulses: 2+ and symmetric    Lab Results: Recent Labs    09/22/20 0335 09/23/20 0520  NA 137 140  K 3.5 3.5  CL 105 106  CO2 23 27  GLUCOSE 114* 121*  BUN 10 10  CREATININE 0.89 0.94  CALCIUM 8.6* 8.8*   Recent Labs    09/22/20 0335 09/23/20 0520  AST 62* 140*  ALT 110* 150*  ALKPHOS 97 110  BILITOT 1.1 1.7*  PROT 6.2* 6.6  ALBUMIN 2.9* 3.0*   Recent Labs    09/22/20 0335 09/23/20 0520  WBC 8.3 13.1*  HGB 12.3 12.6  HCT 37.4 37.6  MCV 91.2 90.6  PLT 312 338   Recent Labs    09/21/20 1105  LABPROT 13.5  INR 1.0      Assessment/Plan: -Choledocholithiasis.  Status post ERCP with CBD stone removal yesterday.  Had laparoscopic cholecystectomy today. -Abnormal LFTs.  Most likely from above  Recommendation ------------------------ -Diet advancement per surgical team -No further inpatient GI work-up planned -Follow-up in GI clinic as scheduled in September. -GI will sign off.  Call us back if needed  Otis Brace MD,  [place] 09/23/2020, 12:22 PM  Contact #  [phone]

## 2020-09-23 NOTE — Anesthesia Preprocedure Evaluation (Addendum)
Anesthesia Evaluation  Patient identified by MRN, date of birth, ID band Patient awake    Reviewed: Allergy & Precautions, NPO status   Airway Mallampati: II  TM Distance: >3 FB     Dental   Pulmonary    breath sounds clear to auscultation       Cardiovascular negative cardio ROS   Rhythm:Regular Rate:Normal     Neuro/Psych  Neuromuscular disease    GI/Hepatic Neg liver ROS, GERD  ,  Endo/Other  negative endocrine ROS  Renal/GU      Musculoskeletal   Abdominal   Peds  Hematology   Anesthesia Other Findings   Reproductive/Obstetrics                             Anesthesia Physical Anesthesia Plan  ASA: 3  Anesthesia Plan: General   Post-op Pain Management:    Induction: Intravenous  PONV Risk Score and Plan: 3 and Ondansetron, Dexamethasone and Midazolam  Airway Management Planned: Oral ETT  Additional Equipment:   Intra-op Plan:   Post-operative Plan: Extubation in OR  Informed Consent: I have reviewed the patients History and Physical, chart, labs and discussed the procedure including the risks, benefits and alternatives for the proposed anesthesia with the patient or authorized representative who has indicated his/her understanding and acceptance.     Dental advisory given  Plan Discussed with: CRNA and Anesthesiologist  Anesthesia Plan Comments:         Anesthesia Quick Evaluation

## 2020-09-23 NOTE — Discharge Instructions (Signed)
[facility] ° °Please arrive at least 30 min before your appointment to complete your check in paperwork.  If you are unable to arrive 30 min prior to your appointment time we may have to cancel or reschedule you. °LAPAROSCOPIC SURGERY: POST OP INSTRUCTIONS °Always review your discharge instruction sheet given to you by the facility where your surgery was performed. °IF YOU HAVE DISABILITY OR FAMILY LEAVE FORMS, YOU MUST BRING THEM TO THE OFFICE FOR PROCESSING.   °DO NOT GIVE THEM TO YOUR DOCTOR. ° °PAIN CONTROL ° °First take acetaminophen (Tylenol) AND/or ibuprofen (Advil) to control your pain after surgery.  Follow directions on package.  Taking acetaminophen (Tylenol) and/or ibuprofen (Advil) regularly after surgery will help to control your pain and lower the amount of prescription pain medication you may need.  You should not take more than 4,000 mg (4 grams) of acetaminophen (Tylenol) in 24 hours.  You should not take ibuprofen (Advil), aleve, motrin, naprosyn or other NSAIDS if you have a history of stomach ulcers or chronic kidney disease.  °A prescription for pain medication may be given to you upon discharge.  Take your pain medication as prescribed, if you still have uncontrolled pain after taking acetaminophen (Tylenol) or ibuprofen (Advil). °Use ice packs to help control pain. °If you need a refill on your pain medication, please contact your pharmacy.  They will contact our office to request authorization. Prescriptions will not be filled after 5pm or on week-ends. ° °HOME MEDICATIONS °Take your usually prescribed medications unless otherwise directed. ° °DIET °You should follow a light diet the first few days after arrival home.  Be sure to include lots of fluids daily. Avoid fatty, fried foods.  ° °CONSTIPATION °It is common to experience some constipation after surgery and if you are taking pain medication.  Increasing fluid intake and taking a stool softener (such as Colace)  will usually help or prevent this problem from occurring.  A mild laxative (Milk of Magnesia or Miralax) should be taken according to package instructions if there are no bowel movements after 48 hours. ° °WOUND/INCISION CARE °Most patients will experience some swelling and bruising in the area of the incisions.  Ice packs will help.  Swelling and bruising can take several days to resolve.  °Unless discharge instructions indicate otherwise, follow guidelines below  °STERI-STRIPS - you may remove your outer bandages 48 hours after surgery, and you may shower at that time.  You have steri-strips (small skin tapes) in place directly over the incision.  These strips should be left on the skin for 7-10 days.   °DERMABOND/SKIN GLUE - you may shower in 24 hours.  The glue will flake off over the next 2-3 weeks. °Any sutures or staples will be removed at the office during your follow-up visit. ° °ACTIVITIES °You may resume regular (light) daily activities beginning the next day--such as daily self-care, walking, climbing stairs--gradually increasing activities as tolerated.  You may have sexual intercourse when it is comfortable.  Refrain from any heavy lifting or straining until approved by your doctor. °You may drive when you are no longer taking prescription pain medication, you can comfortably wear a seatbelt, and you can safely maneuver your car and apply brakes. ° °FOLLOW-UP °You should see your doctor in the office for a follow-up appointment approximately 2-3 weeks after your surgery.  You should have been given your post-op/follow-up appointment when your surgery was scheduled.  If you did not receive a post-op/follow-up appointment, make sure   that you call for this appointment within a day or two after you arrive home to insure a convenient appointment time. ° ° °WHEN TO CALL YOUR DOCTOR: °Fever over 101.0 °Inability to urinate °Continued bleeding from incision. °Increased pain, redness, or drainage from the  incision. °Increasing abdominal pain ° °The clinic staff is available to answer your questions during regular business hours.  Please don’t hesitate to call and ask to speak to one of the nurses for clinical concerns.  If you have a medical emergency, go to the nearest emergency room or call 911.  A surgeon from [facility] is always on call at the hospital. °[address], [address], [place], [place]  [address] ? [address] [address], [place], [place]   [address] °[phone] ? [phone] ? FAX [phone] ° ° ° ° °Managing Your Pain After Surgery Without Opioids ° ° ° °Thank you for participating in our program to help patients manage their pain after surgery without opioids. This is part of our effort to provide you with the best care possible, without exposing you or your family to the risk that opioids pose. ° °What pain can I expect after surgery? °You can expect to have some pain after surgery. This is normal. The pain is typically worse the day after surgery, and quickly begins to get better. °Many studies have found that many patients are able to manage their pain after surgery with Over-the-Counter (OTC) medications such as Tylenol and Motrin. If you have a condition that does not allow you to take Tylenol or Motrin, notify your surgical team. ° °How will I manage my pain? °The best strategy for controlling your pain after surgery is around the clock pain control with Tylenol (acetaminophen) and Motrin (ibuprofen or Advil). Alternating these medications with each other allows you to maximize your pain control. In addition to Tylenol and Motrin, you can use heating pads or ice packs on your incisions to help reduce your pain. ° °How will I alternate your regular strength over-the-counter pain medication? °You will take a dose of pain medication every three hours. °Start by taking 650 mg of Tylenol (2 pills of 325 mg) °3 hours later take 600 mg of Motrin (3 pills of 200 mg) °3 hours after  taking the Motrin take 650 mg of Tylenol °3 hours after that take 600 mg of Motrin. ° ° °- 1 - ° °See example - if your first dose of Tylenol is at 12:00 PM ° ° °12:00 PM Tylenol 650 mg (2 pills of 325 mg)  °3:00 PM Motrin 600 mg (3 pills of 200 mg)  °6:00 PM Tylenol 650 mg (2 pills of 325 mg)  °9:00 PM Motrin 600 mg (3 pills of 200 mg)  °Continue alternating every 3 hours  ° °We recommend that you follow this schedule around-the-clock for at least 3 days after surgery, or until you feel that it is no longer needed. Use the table on the last page of this handout to keep track of the medications you are taking. °Important: °Do not take more than 3000mg of Tylenol or 3200mg of Motrin in a 24-hour period. °Do not take ibuprofen/Motrin if you have a history of bleeding stomach ulcers, severe kidney disease, &/or actively taking a blood thinner ° °What if I still have pain? °If you have pain that is not controlled with the over-the-counter pain medications (Tylenol and Motrin or Advil) you might have what we call “breakthrough” pain. You will receive a prescription   for a small amount of an opioid pain medication such as Oxycodone, Tramadol, or Tylenol with Codeine. Use these opioid pills in the first 24 hours after surgery if you have breakthrough pain. Do not take more than 1 pill every 4-6 hours. ° °If you still have uncontrolled pain after using all opioid pills, don't hesitate to call our staff using the number provided. We will help make sure you are managing your pain in the best way possible, and if necessary, we can provide a prescription for additional pain medication. ° ° °Day 1   ° °Time  °Name of Medication Number of pills taken  °Amount of Acetaminophen  °Pain Level  ° °Comments  °AM PM       °AM PM       °AM PM       °AM PM       °AM PM       °AM PM       °AM PM       °AM PM       °Total Daily amount of Acetaminophen °Do not take more than  3,000 mg per day    ° ° °Day 2   ° °Time  °Name of Medication  Number of pills °taken  °Amount of Acetaminophen  °Pain Level  ° °Comments  °AM PM       °AM PM       °AM PM       °AM PM       °AM PM       °AM PM       °AM PM       °AM PM       °Total Daily amount of Acetaminophen °Do not take more than  3,000 mg per day    ° ° °Day 3   ° °Time  °Name of Medication Number of pills taken  °Amount of Acetaminophen  °Pain Level  ° °Comments  °AM PM       °AM PM       °AM PM       °AM PM       ° ° ° °AM PM       °AM PM       °AM PM       °AM PM       °Total Daily amount of Acetaminophen °Do not take more than  3,000 mg per day    ° ° °Day 4   ° °Time  °Name of Medication Number of pills taken  °Amount of Acetaminophen  °Pain Level  ° °Comments  °AM PM       °AM PM       °AM PM       °AM PM       °AM PM       °AM PM       °AM PM       °AM PM       °Total Daily amount of Acetaminophen °Do not take more than  3,000 mg per day    ° ° °Day 5   ° °Time  °Name of Medication Number °of pills taken  °Amount of Acetaminophen  °Pain Level  ° °Comments  °AM PM       °AM PM       °AM PM       °AM PM       °AM PM       °AM   PM       °AM PM       °AM PM       °Total Daily amount of Acetaminophen °Do not take more than  3,000 mg per day    ° ° ° °Day 6   ° °Time  °Name of Medication Number of pills °taken  °Amount of Acetaminophen  °Pain Level  °Comments  °AM PM       °AM PM       °AM PM       °AM PM       °AM PM       °AM PM       °AM PM       °AM PM       °Total Daily amount of Acetaminophen °Do not take more than  3,000 mg per day    ° ° °Day 7   ° °Time  °Name of Medication Number of pills taken  °Amount of Acetaminophen  °Pain Level  ° °Comments  °AM PM       °AM PM       °AM PM       °AM PM       °AM PM       °AM PM       °AM PM       °AM PM       °Total Daily amount of Acetaminophen °Do not take more than  3,000 mg per day    ° ° ° ° °For additional information about how and where to safely dispose of unused opioid °medications - https://www.[employer]ful[employer].org ° °Disclaimer: This document  contains information and/or instructional materials adapted from [facility] for the typical patient with your condition. It does not replace medical advice from your health care provider because your experience may differ from that of the °typical patient. Talk to your health care provider if you have any questions about this °document, your condition or your treatment plan. °Adapted from [facility] ° °

## 2020-09-23 NOTE — Discharge Summary (Signed)
Physician Discharge Summary  Andrea Pittman [id number] DOB: Feb 07, 1956 DOA: 09/19/2020  PCP: Dettinger, [name], MD  Admit date: 09/19/2020 Discharge date: 09/23/2020  Admitted From: home Disposition:  home  Recommendations for Outpatient Follow-up:  Follow up with general surgery as scheduled  Home Health: none Equipment/Devices: none  Discharge Condition: stable CODE STATUS: Full code Diet recommendation: regular  HPI: Per admitting MD, Andrea Pittman is a 65 y.o. female with medical history significant of anxiety, depression, and GERD presents with complaints of epigastric abdominal pain.  Symptoms started 3 days ago after eating dinner.  She reports having a constant aching pain that was worse on the right upper quadrant.  Noted tenderness with palpation in this area.  Associated symptoms included nausea and vomiting.  Emesis was noted to be of stomach contents and nonbloody.  She only utilizes ibuprofen intermittently when she has a bad headache and not on the daily basis.  Patient reports she is 5 years overdue for repeat colonoscopy, but the previous one was reported to be normal.  Patient denies having any fever, shortness of breath, chest pain, dysuria, diarrhea, or blood in stools. Patient had been seen in the emergency department on with complaints of chest pain 08/26/2020 and had a CT scan of the chest, abdomen, and pelvis which noted concern for hepatic steatosis without concerning findings for cholecystitis.  Patient reports she was discharged home with medication for acid reflux.  Hospital Course / Discharge diagnoses: Principal Problem Cholelithiasis, choledocholithiasis -patient was admitted to the hospital with abdominal pain, underwent an MRI which showed cholelithiasis and choledocholithiasis.  GI was consulted and patient underwent an ERCP on 7/21 with sphincterotomy but no stones were present meaning she likely has passed them.  General surgery was also consulted and  underwent laparoscopic cholecystectomy on 7/22.  She recovered well postoperatively, pain is controlled with oral agents and she is able to tolerate a regular diet.  She will be discharged home in stable condition with outpatient follow-up.   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  She would benefit from weight loss  Sepsis ruled out   Discharge Instructions   Allergies as of 09/23/2020   No Known Allergies      Medication List     TAKE these medications    acetaminophen 500 MG tablet Commonly known as: TYLENOL Take 2 tablets (1,000 mg total) by mouth every 6 (six) hours as needed.   estradiol 2 MG tablet Commonly known as: ESTRACE Take 1 mg by mouth daily.   famotidine 40 MG tablet Commonly known as: Pepcid Take 1 tablet (40 mg total) by mouth daily.   ibuprofen 200 MG tablet Commonly known as: ADVIL Take 400-600 mg by mouth every 6 (six) hours as needed for fever, headache or mild pain.   ondansetron 8 MG disintegrating tablet Commonly known as: Zofran ODT Take 1 tablet (8 mg total) by mouth every 8 (eight) hours as needed for nausea or vomiting.   oxyCODONE 5 MG immediate release tablet Commonly known as: Oxy IR/ROXICODONE Take 1 tablet (5 mg total) by mouth every 4 (four) hours as needed for moderate pain.   oxymetazoline 0.05 % nasal spray Commonly known as: AFRIN Place 1-2 sprays into both nostrils 2 (two) times daily as needed for congestion.   pantoprazole 40 MG tablet Commonly known as: PROTONIX Take 1 tablet (40 mg total) by mouth daily.   pregabalin 100 MG capsule Commonly  known as: Lyrica Take 1 capsule (100 mg total) by mouth 2 (two) times daily.   sucralfate 1 g tablet Commonly known as: Carafate Take 1 tablet (1 g total) by mouth 4 (four) times daily -  with meals and at bedtime.       ASK your doctor about these  medications    DULoxetine 60 MG capsule Commonly known as: Gibbon        Follow-up Information     [facility] [place] Follow up on 10/11/2020.   Specialty: General Surgery Why: 10:45 am, arrive by 10:15am for paperwork and check in process Contact information: [place] [address] [phone]                 Consultations: Gastroenterology General surgery  Procedures/Studies: ERCP Laparoscopic cholecystectomy  DG Chest Portable 1 View  Result Date: 08/26/2020 CLINICAL DATA:  Chest pain since yesterday, worsening today. EXAM: PORTABLE CHEST 1 VIEW COMPARISON:  05/19/2012. FINDINGS: Cardiac silhouette is normal in size. No mediastinal or hilar masses. No evidence of adenopathy. Clear lungs.  No convincing pleural effusion.  No pneumothorax. Skeletal structures are grossly intact. IMPRESSION: No active disease. Electronically Signed   By: Lajean Manes M.D.   On: 08/26/2020 18:17   DG ERCP  Result Date: 09/22/2020 CLINICAL DATA:  65 year old female undergoing ERCP. EXAM: ERCP TECHNIQUE: Multiple spot images obtained with the fluoroscopic device and submitted for interpretation post-procedure. FLUOROSCOPY TIME:  Fluoroscopy Time:  2 minutes 46 seconds Radiation Exposure Index (if provided by the fluoroscopic device): 37.31 mGy Number of Acquired Spot Images: 2 COMPARISON:  MR abdomen, 09/20/2020. FINDINGS: Two obliques planar radiographs of the abdomen by CT arm demonstrating ERCP, retrograde cholangiogram and balloon sweep. Small filling defects within the common bile duct, and retrograde opacification of the gallbladder with gallstones. IMPRESSION: 1. Intraprocedural fluoroscopic imaging for ERCP. 2. Retrograde opacification of the gallbladder demonstrating gallstones and filling defects within the common bile duct. These images were submitted for radiologic interpretation only. Please see the procedural report for  the amount of contrast and the fluoroscopy time utilized. Electronically Signed   By: Michaelle Birks MD   On: 09/22/2020 12:04   MR ABDOMEN MRCP W WO CONTAST  Result Date: 09/20/2020 CLINICAL DATA:  Epigastric abdominal pain EXAM: MRI ABDOMEN WITHOUT AND WITH CONTRAST (INCLUDING MRCP) TECHNIQUE: Multiplanar multisequence MR imaging of the abdomen was performed both before and after the administration of intravenous contrast. Heavily T2-weighted images of the biliary and pancreatic ducts were obtained, and three-dimensional MRCP images were rendered by post processing. CONTRAST:  8.35m GADAVIST GADOBUTROL 1 MMOL/ML IV SOLN COMPARISON:  Right upper quadrant ultrasound dated 09/19/2020. CTA abdomen/pelvis dated 08/26/2020. FINDINGS: Lower chest: Lung bases are clear. Hepatobiliary: Severe hepatic steatosis. No focal hepatic lesion is seen. Numerous gallstones (series 6/image 20), without associated inflammatory changes. No intrahepatic ductal dilatation. Common duct measures 11 mm (series 4/image 18). Two distal CBD stones measuring up to 5 mm (series 4/image 15). Pancreas:  Within normal limits. Spleen:  Within normal limits. Adrenals/Urinary Tract:  Adrenal glands are within normal limits. Subcentimeter upper pole renal cysts.  No hydronephrosis. Stomach/Bowel: Stomach is within normal limits. Visualized bowel is unremarkable. Mild left colonic diverticulosis, without evidence of diverticulitis. Vascular/Lymphatic:  No evidence of abdominal aortic aneurysm. No suspicious abdominal lymphadenopathy. Other:  No abdominal ascites. Musculoskeletal: No focal osseous lesions. IMPRESSION: Choledocholithiasis with two distal CBD stones measuring up to 5 mm. Common duct measures  11 mm. ERCP is suggested. Numerous gallstones, without associated inflammatory changes to suggest acute cholecystitis. Severe hepatic steatosis. Electronically Signed   By: Julian Hy M.D.   On: 09/20/2020 21:31   CT Angio Chest/Abd/Pel for  Dissection W and/or Wo Contrast  Result Date: 08/26/2020 CLINICAL DATA:  Centralized chest pain and epigastric pain EXAM: CT ANGIOGRAPHY CHEST, ABDOMEN AND PELVIS TECHNIQUE: Non-contrast CT of the chest was initially obtained. Multidetector CT imaging through the chest, abdomen and pelvis was performed using the standard protocol during bolus administration of intravenous contrast. Multiplanar reconstructed images and MIPs were obtained and reviewed to evaluate the vascular anatomy. CONTRAST:  171m OMNIPAQUE IOHEXOL 350 MG/ML SOLN COMPARISON:  Radiograph 08/26/2020 FINDINGS: CTA CHEST FINDINGS Cardiovascular: Noncontrast CT of the chest performed initially reveals a normal caliber aorta. No hyperdense mural thickening to suggest intramural hematoma. Postcontrast administration there is satisfactory opacification the thoracic aorta. No acute luminal abnormality is seen. No periaortic stranding or hemorrhage. Normal 3 vessel branching of the aortic arch. Proximal great vessels are unremarkable. Central pulmonary arteries are normal caliber. No large central or lobar filling defects within limitations non tailored examination of the pulmonary arteries. Normal heart size. No pericardial effusion. No major venous abnormalities within the limitations of this arterial phase exam. Mediastinum/Nodes: No mediastinal fluid or gas. 13 mm hypoattenuating left thyroid nodule. Not clinically significant; no follow-up imaging recommended (ref: J Am Coll Radiol. 2015 Feb;12(2): 143-50). No acute abnormality of the trachea or esophagus. No worrisome mediastinal, hilar or axillary adenopathy. Lungs/Pleura: Atelectatic changes in the lungs likely accentuated by imaging during exhalation as evidenced by posterior bowing of the trachea. No consolidation, features of edema, pneumothorax, or effusion. No suspicious pulmonary nodules or masses. Musculoskeletal: No chest wall abnormality. No acute or significant osseous findings. Review  of the MIP images confirms the above findings. CTA ABDOMEN AND PELVIS FINDINGS VASCULAR Aorta: Minimal atherosclerotic plaque within the abdominal aorta. No aneurysm or ectasia. No acute luminal abnormality. No periaortic stranding or hemorrhage. Celiac: Patent without evidence of aneurysm, dissection, vasculitis or significant stenosis. SMA: Patent without evidence of aneurysm, dissection, vasculitis or significant stenosis. Renals: Single renal arteries bilaterally. Both renal arteries are patent without evidence of aneurysm, dissection, vasculitis, fibromuscular dysplasia or significant stenosis. IMA: Patent ostium. Normally opacified. No evidence of aneurysm, dissection or vasculitis. Inflow and proximal outflow: Inflow vasculature including the common, internal external iliac arteries are widely patent. No acute luminal abnormality. No aneurysm or ectasia. Included portions of the outflow vasculature including the common, proximal superficial and deep femoral arteries are widely patent without acute abnormality or concerning narrowing or occlusion. Veins: No obvious venous abnormality within the limitations of this arterial phase study. Review of the MIP images confirms the above findings. NON-VASCULAR Hepatobiliary: Diffuse hepatic hypoattenuation compatible with hepatic steatosis. Sparing along the gallbladder fossa. Smooth liver surface contour. No concerning focal liver lesions. Gallbladder is unremarkable. No visible calcified gallstones or significant biliary ductal dilatation accounting for patient age. Pancreas: Few fatty clefts noted towards the pancreatic head. No pancreatic ductal dilatation or surrounding inflammatory changes. Spleen: Normal in size. No concerning splenic lesions. Adrenals/Urinary Tract: Normal adrenal glands. Kidneys are normally located with symmetric enhancement. No suspicious renal lesion, urolithiasis or hydronephrosis. Urinary bladder is unremarkable for the degree of  distention. Stomach/Bowel: Distal esophagus, stomach and duodenum are unremarkable. No small bowel thickening or dilatation. Appendix is not visualized. No focal inflammation the vicinity of the cecum to suggest an occult appendicitis. No colonic dilatation or wall thickening. Few  scattered colonic diverticula without focal inflammation to suggest diverticulitis. No evidence of bowel obstruction. Lymphatic: No suspicious or enlarged lymph nodes in the included lymphatic chains. Reproductive: Uterus is surgically absent. No concerning adnexal lesions. Other: No abdominopelvic free fluid or free gas. No bowel containing hernias. Bilateral fat containing inguinal hernias. Musculoskeletal: Multilevel degenerative changes are present in the imaged portions of the spine. Additional degenerative changes in the hips and pelvis. No acute osseous abnormality or suspicious osseous lesion. Review of the MIP images confirms the above findings. IMPRESSION: No evidence of acute aortic syndrome or other acute or worrisome vascular abnormality. Aortic Atherosclerosis (ICD10-I70.0). No significant plaque narrowing or stenosis. Hypoventilatory changes/atelectasis, likely accentuated by imaging during exhalation. No other acute process seen in the chest, abdomen or pelvis to provide cause for patient's symptoms. Prior hysterectomy. Hepatic steatosis. Electronically Signed   By: Lovena Le M.D.   On: 08/26/2020 21:02   US Abdomen Limited RUQ (LIVER/GB)  Result Date: 09/19/2020 CLINICAL DATA:  Right upper quadrant pain. EXAM: ULTRASOUND ABDOMEN LIMITED RIGHT UPPER QUADRANT COMPARISON:  CT abdomen pelvis 08/26/2020 FINDINGS: Gallbladder: Multiple calcified gallstones noted within the gallbladder lumen. No gallbladder wall thickening or pericholecystic fluid visualized. No sonographic Murphy sign noted by sonographer. Common bile duct: Diameter: 10 mm.  No intrahepatic biliary ductal dilatation. Liver: No focal lesion identified.  Increased parenchymal echogenicity. Portal vein is patent on color Doppler imaging with normal direction of blood flow towards the liver. Other: None. IMPRESSION: 1. Cholelithiasis with no findings of acute cholecystitis. 2. Stable enlarged common bile duct measuring up to 10 mm. 3. Hepatic steatosis. Please note limited evaluation for focal hepatic masses in a patient with hepatic steatosis due to decreased penetration of the acoustic ultrasound waves. Electronically Signed   By: Iven Finn M.D.   On: 09/19/2020 22:21     Subjective: - no chest pain, shortness of breath, no abdominal pain, nausea or vomiting.   Discharge Exam: BP 140/85   Pulse 87   Temp 98.4 F (36.9 C) (Oral)   Resp 16   Ht '5\' 4"'$  (1.626 m)   Wt 85.7 kg   SpO2 94%   BMI 32.44 kg/m   General: Pt is alert, awake, not in acute distress Cardiovascular: RRR, S1/S2 +, no rubs, no gallops Respiratory: CTA bilaterally, no wheezing, no rhonchi Abdominal: Soft, NT, ND, bowel sounds + Extremities: no edema, no cyanosis   The results of significant diagnostics from this hospitalization (including imaging, microbiology, ancillary and laboratory) are listed below for reference.     Microbiology: Recent Results (from the past 240 hour(s))  Resp Panel by RT-PCR (Flu A&B, Covid) Nasopharyngeal Swab     Status: None   Collection Time: 09/20/20  2:24 PM   Specimen: Nasopharyngeal Swab; Nasopharyngeal(NP) swabs in vial transport medium  Result Value Ref Range Status   SARS Coronavirus 2 by RT PCR NEGATIVE NEGATIVE Final    Comment: (NOTE) SARS-CoV-2 target nucleic acids are NOT DETECTED.  The SARS-CoV-2 RNA is generally detectable in upper respiratory specimens during the acute phase of infection. The lowest concentration of SARS-CoV-2 viral copies this assay can detect is 138 copies/mL. A negative result does not preclude SARS-Cov-2 infection and should not be used as the sole basis for treatment or other patient  management decisions. A negative result may occur with  improper specimen collection/handling, submission of specimen other than nasopharyngeal swab, presence of viral mutation(s) within the areas targeted by this assay, and inadequate number of viral copies(<138 copies/mL). A  negative result must be combined with clinical observations, patient history, and epidemiological information. The expected result is Negative.  Fact Sheet for Patients:  EntrepreneurPulse.com.au  Fact Sheet for Healthcare Providers:  IncredibleEmployment.be  This test is no t yet approved or cleared by the [place] FDA and  has been authorized for detection and/or diagnosis of SARS-CoV-2 by FDA under an Emergency Use Authorization (EUA). This EUA will remain  in effect (meaning this test can be used) for the duration of the COVID-19 declaration under Section 564(b)(1) of the Act, 21 U.S.C.section 360bbb-3(b)(1), unless the authorization is terminated  or revoked sooner.       Influenza A by PCR NEGATIVE NEGATIVE Final   Influenza B by PCR NEGATIVE NEGATIVE Final    Comment: (NOTE) The Xpert Xpress SARS-CoV-2/FLU/RSV plus assay is intended as an aid in the diagnosis of influenza from Nasopharyngeal swab specimens and should not be used as a sole basis for treatment. Nasal washings and aspirates are unacceptable for Xpert Xpress SARS-CoV-2/FLU/RSV testing.  Fact Sheet for Patients: EntrepreneurPulse.com.au  Fact Sheet for Healthcare Providers: IncredibleEmployment.be  This test is not yet approved or cleared by the [place] FDA and has been authorized for detection and/or diagnosis of SARS-CoV-2 by FDA under an Emergency Use Authorization (EUA). This EUA will remain in effect (meaning this test can be used) for the duration of the COVID-19 declaration under Section 564(b)(1) of the Act, 21 U.S.C. section 360bbb-3(b)(1),  unless the authorization is terminated or revoked.  Performed at [facility], [place] [address] [place], [place] [address]   Surgical pcr screen     Status: None   Collection Time: 09/23/20  7:45 AM   Specimen: Nasal Mucosa; Nasal Swab  Result Value Ref Range Status   MRSA, PCR NEGATIVE NEGATIVE Final   Staphylococcus aureus NEGATIVE NEGATIVE Final    Comment: (NOTE) The Xpert SA Assay (FDA approved for NASAL specimens in patients 66 years of age and older), is one component of a comprehensive surveillance program. It is not intended to diagnose infection nor to guide or monitor treatment. Performed at [facility], [place] [address] [place],  [address]      Labs: Basic Metabolic Panel: Recent Labs  Lab 09/19/20 2151 09/21/20 0126 09/22/20 0335 09/23/20 0520  NA 134* 137 137 140  K 3.4* 3.7 3.5 3.5  CL 97* 104 105 106  CO2 '27 27 23 27  '$ GLUCOSE 169* 95 114* 121*  BUN '8 10 10 10  '$ CREATININE 0.83 0.87 0.89 0.94  CALCIUM 8.9 8.4* 8.6* 8.8*   Liver Function Tests: Recent Labs  Lab 09/19/20 2151 09/21/20 0126 09/22/20 0335 09/23/20 0520  AST 251* 93* 62* 140*  ALT 228* 155* 110* 150*  ALKPHOS 132* 114 97 110  BILITOT 3.6* 1.5* 1.1 1.7*  PROT 8.3* 6.7 6.2* 6.6  ALBUMIN 3.9 3.1* 2.9* 3.0*   CBC: Recent Labs  Lab 09/19/20 2151 09/21/20 0126 09/22/20 0335 09/23/20 0520  WBC 13.3* 10.1 8.3 13.1*  NEUTROABS 10.2*  --   --   --   HGB 14.8 13.1 12.3 12.6  HCT 44.8 39.9 37.4 37.6  MCV 90.3 90.9 91.2 90.6  PLT 419* 329 312 338   CBG: No results for input(s): GLUCAP in the last 168 hours. Hgb A1c No results for input(s): HGBA1C in the last 72 hours. Lipid Profile No results for input(s): CHOL, HDL, LDLCALC, TRIG, CHOLHDL, LDLDIRECT in the last 72 hours. Thyroid function studies No results for input(s):  TSH, T4TOTAL, T3FREE, THYROIDAB in the last 72 hours.  Invalid input(s): FREET3 Urinalysis    Component Value Date/Time   COLORURINE AMBER  (A) 09/19/2020 2154   APPEARANCEUR HAZY (A) 09/19/2020 2154   LABSPEC 1.015 09/19/2020 2154   PHURINE 6.0 09/19/2020 2154   GLUCOSEU NEGATIVE 09/19/2020 2154   HGBUR SMALL (A) 09/19/2020 2154   BILIRUBINUR SMALL (A) 09/19/2020 2154   KETONESUR 5 (A) 09/19/2020 2154   PROTEINUR 30 (A) 09/19/2020 2154   NITRITE NEGATIVE 09/19/2020 2154   LEUKOCYTESUR TRACE (A) 09/19/2020 2154    FURTHER DISCHARGE INSTRUCTIONS:   Get Medicines reviewed and adjusted: Please take all your medications with you for your next visit with your Primary MD   Laboratory/radiological data: Please request your Primary MD to go over all hospital tests and procedure/radiological results at the follow up, please ask your Primary MD to get all Hospital records sent to his/her office.   In some cases, they will be blood work, cultures and biopsy results pending at the time of your discharge. Please request that your primary care M.D. goes through all the records of your hospital data and follows up on these results.   Also Note the following: If you experience worsening of your admission symptoms, develop shortness of breath, life threatening emergency, suicidal or homicidal thoughts you must seek medical attention immediately by calling 911 or calling your MD immediately  if symptoms less severe.   You must read complete instructions/literature along with all the possible adverse reactions/side effects for all the Medicines you take and that have been prescribed to you. Take any new Medicines after you have completely understood and accpet all the possible adverse reactions/side effects.    Do not drive when taking Pain medications or sleeping medications (Benzodaizepines)   Do not take more than prescribed Pain, Sleep and Anxiety Medications. It is not advisable to combine anxiety,sleep and pain medications without talking with your primary care practitioner   Special Instructions: If you have smoked or chewed Tobacco   in the last 2 yrs please stop smoking, stop any regular Alcohol  and or any Recreational drug use.   Wear Seat belts while driving.   Please note: You were cared for by a hospitalist during your hospital stay. Once you are discharged, your primary care physician will handle any further medical issues. Please note that NO REFILLS for any discharge medications will be authorized once you are discharged, as it is imperative that you return to your primary care physician (or establish a relationship with a primary care physician if you do not have one) for your post hospital discharge needs so that they can reassess your need for medications and monitor your lab values.  Time coordinating discharge: 40 minutes  SIGNED:  Marzetta Board, MD, PhD 09/23/2020, 3:21 PM

## 2020-09-24 ENCOUNTER — Encounter (HOSPITAL_COMMUNITY): Payer: Self-pay | Admitting: Surgery

## 2020-09-26 ENCOUNTER — Encounter (HOSPITAL_COMMUNITY): Payer: Self-pay | Admitting: Gastroenterology

## 2020-09-26 LAB — SURGICAL PATHOLOGY

## 2020-09-27 ENCOUNTER — Ambulatory Visit (INDEPENDENT_AMBULATORY_CARE_PROVIDER_SITE_OTHER): Payer: Medicare Other | Admitting: Nurse Practitioner

## 2020-09-27 ENCOUNTER — Encounter: Payer: Self-pay | Admitting: Nurse Practitioner

## 2020-09-27 DIAGNOSIS — B373 Candidiasis of vulva and vagina: Secondary | ICD-10-CM | POA: Diagnosis not present

## 2020-09-27 DIAGNOSIS — B3731 Acute candidiasis of vulva and vagina: Secondary | ICD-10-CM | POA: Insufficient documentation

## 2020-09-27 MED ORDER — FLUCONAZOLE 150 MG PO TABS: 150.0000 mg | ORAL_TABLET | Freq: Once | ORAL | 0 refills | Status: AC

## 2020-09-27 NOTE — Progress Notes (Signed)
   Virtual Visit  Note Due to COVID-19 pandemic this visit was conducted virtually. This visit type was conducted due to national recommendations for restrictions regarding the COVID-19 Pandemic (e.g. social distancing, sheltering in place) in an effort to limit this patient's exposure and mitigate transmission in our community. All issues noted in this document were discussed and addressed.  A physical exam was not performed with this format.  I connected with Andrea Pittman on 09/27/20 at 12:46 by telephone and verified that I am speaking with the correct person using two identifiers. Andrea Pittman is currently located at home during visit. The provider, Ivy Lynn, NP is located in their office at time of visit.  I discussed the limitations, risks, security and privacy concerns of performing an evaluation and management service by telephone and the availability of in person appointments. I also discussed with the patient that there may be a patient responsible charge related to this service. The patient expressed understanding and agreed to proceed.   History and Present Illness:  HPI  Patient recently admitted to the hospital and was given antibiotic before and after surgery.  Patient developed vaginal yeast and symptoms have not been well controlled, symptoms include itching, and vaginal discharge.  Review of Systems  Constitutional:  Negative for chills and fever.  HENT: Negative.    Cardiovascular: Negative.   Skin:  Negative for rash.  All other systems reviewed and are negative.   Observations/Objective: Televisit patient not in distress  Assessment and Plan: Diflucan 150 mg tablet by mouth once, for antibiotic induced vaginal yeast. Follow Up Instructions: Follow-up with worsening unresolved symptoms.    I discussed the assessment and treatment plan with the patient. The patient was provided an opportunity to ask questions and all were answered. The patient agreed with the  plan and demonstrated an understanding of the instructions.   The patient was advised to call back or seek an in-person evaluation if the symptoms worsen or if the condition fails to improve as anticipated.  The above assessment and management plan was discussed with the patient. The patient verbalized understanding of and has agreed to the management plan. Patient is aware to call the clinic if symptoms persist or worsen. Patient is aware when to return to the clinic for a follow-up visit. Patient educated on when it is appropriate to go to the emergency department.   Time call ended: 2:55 PM  I provided 9 minutes of  non face-to-face time during this encounter.    Ivy Lynn, NP

## 2020-09-27 NOTE — Assessment & Plan Note (Signed)
Diflucan 150 mg tablet by mouth once, for antibiotic induced vaginal yeast.

## 2020-10-14 ENCOUNTER — Encounter: Payer: Self-pay | Admitting: *Deleted

## 2020-10-31 ENCOUNTER — Ambulatory Visit: Payer: Medicare Other | Admitting: Neurology

## 2020-11-02 ENCOUNTER — Other Ambulatory Visit: Payer: Self-pay | Admitting: Neurology

## 2020-11-02 ENCOUNTER — Encounter: Payer: Medicare Other | Admitting: Family Medicine

## 2020-11-08 DIAGNOSIS — K219 Gastro-esophageal reflux disease without esophagitis: Secondary | ICD-10-CM | POA: Diagnosis not present

## 2020-11-08 DIAGNOSIS — R748 Abnormal levels of other serum enzymes: Secondary | ICD-10-CM | POA: Diagnosis not present

## 2020-11-09 ENCOUNTER — Ambulatory Visit (INDEPENDENT_AMBULATORY_CARE_PROVIDER_SITE_OTHER): Payer: Medicare Other

## 2020-11-09 DIAGNOSIS — Z0000 Encounter for general adult medical examination without abnormal findings: Secondary | ICD-10-CM

## 2020-11-09 NOTE — Progress Notes (Signed)
MEDICARE ANNUAL WELLNESS VISIT  11/09/2020  Telephone Visit Disclaimer This Medicare AWV was conducted by telephone due to national recommendations for restrictions regarding the COVID-19 Pandemic (e.g. social distancing).  I verified, using two identifiers, that I am speaking with Andrea Pittman or their authorized healthcare agent. I discussed the limitations, risks, security, and privacy concerns of performing an evaluation and management service by telephone and the potential availability of an in-person appointment in the future. The patient expressed understanding and agreed to proceed.  Location of Patient: Home Location of Provider (nurse):  [facility]  Subjective:    Andrea Pittman is a 65 y.o. female patient of Dettinger, [name], MD who had a Medicare Annual Wellness Visit today via telephone. Oluwademilade is Retired and lives with their spouse. She has two children and one grandchild. She reports that she is socially active and does interact with friends/family regularly. She is moderately physically active and enjoys spending time with her children and grandchildren. .  Patient Care Team: Dettinger, [name], MD as PCP - General (Family Medicine) Alda Berthold, DO as Consulting Physician (Neurology)  Advanced Directives 11/09/2020 09/22/2020 09/20/2020 09/19/2020 08/26/2020 05/02/2020 10/01/2019  Does Patient Have a Medical Advance Directive? No No No No No No No  Would patient like information on creating a medical advance directive? No - Patient declined No - Patient declined No - Patient declined - No - Patient declined - No - Patient declined    Hospital Utilization Over the Past 12 Months: # of hospitalizations or ER visits: 1 # of surgeries:  2  Review of Systems    Patient reports that her overall health is better compared to last year.  History obtained from chart review and the patient  Patient Reported Readings (BP, Pulse, CBG, Weight, etc) none  Pain Assessment Pain :  No/denies pain     Current Medications & Allergies (verified) Allergies as of 11/09/2020   No Known Allergies      Medication List        Accurate as of November 09, 2020 11:42 AM. If you have any questions, ask your nurse or doctor.          STOP taking these medications    famotidine 40 MG tablet Commonly known as: Pepcid   ondansetron 8 MG disintegrating tablet Commonly known as: Zofran ODT   oxyCODONE 5 MG immediate release tablet Commonly known as: Oxy IR/ROXICODONE   oxymetazoline 0.05 % nasal spray Commonly known as: AFRIN   pantoprazole 40 MG tablet Commonly known as: PROTONIX   sucralfate 1 g tablet Commonly known as: Carafate       TAKE these medications    acetaminophen 500 MG tablet Commonly known as: TYLENOL Take 2 tablets (1,000 mg total) by mouth every 6 (six) hours as needed.   DULoxetine 60 MG capsule Commonly known as: CYMBALTA TAKE TWO CAPSULES EACH DAY What changed:  how much to take how to take this when to take this additional instructions   estradiol 2 MG tablet Commonly known as: ESTRACE Take 1 mg by mouth daily.   ibuprofen 200 MG tablet Commonly known as: ADVIL Take 400-600 mg by mouth every 6 (six) hours as needed for fever, headache or mild pain.   omeprazole 20 MG capsule Commonly known as: PRILOSEC Take 20 mg by mouth daily.   pregabalin 100 MG capsule Commonly known as: LYRICA TAKE ONE CAPSULE BY MOUTH TWICE A DAY   Vitamin D 50 MCG (2000  UT) Caps Take by mouth.        History (reviewed): Past Medical History:  Diagnosis Date   Anxiety    Depression    GERD (gastroesophageal reflux disease)    Past Surgical History:  Procedure Laterality Date   ABDOMINAL HYSTERECTOMY     BILATERAL CARPAL TUNNEL RELEASE     bladder tack     CHOLECYSTECTOMY N/A 09/23/2020   Procedure: LAPAROSCOPIC CHOLECYSTECTOMY;  Surgeon: Donnie Mesa, MD;  Location: [place];  Service: General;  Laterality: N/A;   ERCP N/A  09/22/2020   Procedure: ENDOSCOPIC RETROGRADE CHOLANGIOPANCREATOGRAPHY (ERCP);  Surgeon: Clarene Essex, MD;  Location: [place];  Service: Endoscopy;  Laterality: N/A;   REMOVAL OF STONES  09/22/2020   Procedure: REMOVAL OF STONES;  Surgeon: Clarene Essex, MD;  Location: [place];  Service: Endoscopy;;   SPHINCTEROTOMY  09/22/2020   Procedure: Joan Mayans;  Surgeon: Clarene Essex, MD;  Location: [facility] ENDOSCOPY;  Service: Endoscopy;;   Family History  Problem Relation Age of Onset   Cancer Mother    Social History   Socioeconomic History   Marital status: Married    Spouse name: Not on file   Number of children: Not on file   Years of education: Not on file   Highest education level: Not on file  Occupational History   Not on file  Tobacco Use   Smoking status: Never   Smokeless tobacco: Never  Vaping Use   Vaping Use: Never used  Substance and Sexual Activity   Alcohol use: No   Drug use: No   Sexual activity: Not on file  Other Topics Concern   Not on file  Social History Narrative   She does not work and watches her grandson.    Right handed    Social Determinants of Health   Financial Resource Strain: Not on file  Food Insecurity: Not on file  Transportation Needs: Not on file  Physical Activity: Not on file  Stress: Not on file  Social Connections: Not on file    Activities of Daily Living In your present state of health, do you have any difficulty performing the following activities: 11/09/2020 09/20/2020  Hearing? N -  Vision? N -  Difficulty concentrating or making decisions? N -  Walking or climbing stairs? N -  Dressing or bathing? N -  Doing errands, shopping? N N  Preparing Food and eating ? N -  Using the Toilet? N -  In the past six months, have you accidently leaked urine? N -  Do you have problems with loss of bowel control? N -  Managing your Medications? N -  Managing your Finances? N -  Housekeeping or managing your Housekeeping? N -  Some  recent data might be hidden    Patient Education/ Literacy How often do you need to have someone help you when you read instructions, pamphlets, or other written materials from your doctor or pharmacy?: 1 - Never  Exercise Current Exercise Habits: The patient does not participate in regular exercise at present, Exercise limited by: None identified  Diet Patient reports consuming 2 meals a day and 1 snack(s) a day Patient reports that her primary diet is: Regular Patient reports that she does have regular access to food.   Depression Screen PHQ 2/9 Scores 09/19/2020 08/31/2020 02/19/2020 05/28/2019 07/04/2017 05/30/2015 02/15/2015  PHQ - 2 Score 0 0 0 0 2 0 0  PHQ- 9 Score 0 - - - 8 - -     Fall  Risk Fall Risk  11/09/2020 08/31/2020 05/02/2020 02/19/2020 10/01/2019  Falls in the past year? 0 0 0 0 0  Number falls in past yr: - - 0 - 0  Injury with Fall? - - 0 - 0  Follow up Falls evaluation completed - - - -     Objective:  Andrea Pittman seemed alert and oriented and she participated appropriately during our telephone visit.  Blood Pressure Weight BMI  BP Readings from Last 3 Encounters:  09/23/20 140/85  09/19/20 (!) 164/97  08/31/20 140/80   Wt Readings from Last 3 Encounters:  09/20/20 189 lb (85.7 kg)  09/19/20 189 lb (85.7 kg)  08/31/20 192 lb (87.1 kg)   BMI Readings from Last 1 Encounters:  09/20/20 32.44 kg/m    *Unable to obtain current vital signs, weight, and BMI due to telephone visit type  Hearing/Vision  Nalah did not seem to have difficulty with hearing/understanding during the telephone conversation Reports that she has had a formal eye exam by an eye care professional within the past year Reports that she has not had a formal hearing evaluation within the past year *Unable to fully assess hearing and vision during telephone visit type  Cognitive Function: 6CIT Screen 11/09/2020  What Year? 0 points  What month? 0 points  What time? 0 points  Count back from  20 0 points  Months in reverse 0 points  Repeat phrase 0 points  Total Score 0   (Normal:0-7, Significant for Dysfunction: >8)  Normal Cognitive Function Screening: Yes   Immunization & Health Maintenance Record Immunization History  Administered Date(s) Administered   Pneumococcal Conjugate-13 02/19/2020   Td 07/10/2010   Tdap 07/10/2010    Health Maintenance  Topic Date Due   COVID-19 Vaccine (1) Never done   Zoster Vaccines- Shingrix (1 of 2) Never done   COLONOSCOPY (Pts 45-30yr Insurance coverage will need to be confirmed)  03/05/2017   MAMMOGRAM  12/03/2019   DEXA SCAN  Never done   TETANUS/TDAP  07/09/2020   INFLUENZA VACCINE  Never done   PAP SMEAR-Modifier  10/17/2020   PNA vac Low Risk Adult (2 of 2 - PPSV23) 02/18/2021   Hepatitis C Screening  Completed   HIV Screening  Completed   HPV VACCINES  Aged Out       Assessment  This is a routine wellness examination for JBERNEDETTE INSINGA  Health Maintenance: Due or Overdue Health Maintenance Due  Topic Date Due   COVID-19 Vaccine (1) Never done   Zoster Vaccines- Shingrix (1 of 2) Never done   COLONOSCOPY (Pts 45-430yrInsurance coverage will need to be confirmed)  03/05/2017   MAMMOGRAM  12/03/2019   DEXA SCAN  Never done   TETANUS/TDAP  07/09/2020   INFLUENZA VACCINE  Never done   PAP SMEAR-Modifier  10/17/2020    JaConsuella Loseoes not need a referral for Community Assistance: Care Management:   no Social Work:    no Prescription Assistance:  no Nutrition/Diabetes Education:  no   Plan:  Personalized Goals  Goals Addressed             This Visit's Progress    Patient Stated       11/09/2020 AWV Goal: Fall Prevention  Over the next year, patient will decrease their risk for falls by: Using assistive devices, such as a cane or walker, as needed Identifying fall risks within their home and correcting them by: Removing throw rugs Adding handrails to stairs or  ramps Removing clutter and keeping  a clear pathway throughout the home Increasing light, especially at night Adding shower handles/bars Raising toilet seat Identifying potential personal risk factors for falls: Medication side effects Incontinence/urgency Vestibular dysfunction Hearing loss Musculoskeletal disorders Neurological disorders Orthostatic hypotension         Personalized Health Maintenance & Screening Recommendations  Influenza vaccine Td vaccine Screening mammography Screening Pap smear and pelvic exam  Bone densitometry screening Colorectal cancer screening  Lung Cancer Screening Recommended: no (Low Dose CT Chest recommended if Age 29-80 years, 30 pack-year currently smoking OR have quit w/in past 15 years) Hepatitis C Screening recommended: no HIV Screening recommended: no  Advanced Directives: Written information was not prepared per patient's request.  Referrals & Orders No orders of the defined types were placed in this encounter.   Follow-up Plan Follow-up with Dettinger, [name], MD as planned    I have personally reviewed and noted the following in the patient's chart:   Medical and social history Use of alcohol, tobacco or illicit drugs  Current medications and supplements Functional ability and status Nutritional status Physical activity Advanced directives List of other physicians Hospitalizations, surgeries, and ER visits in previous 12 months Vitals Screenings to include cognitive, depression, and falls Referrals and appointments  In addition, I have reviewed and discussed with Andrea Pittman certain preventive protocols, quality metrics, and best practice recommendations. A written personalized care plan for preventive services as well as general preventive health recommendations is available and can be mailed to the patient at her request.      Felicity Coyer, LPN     [id number]

## 2020-12-19 DIAGNOSIS — L821 Other seborrheic keratosis: Secondary | ICD-10-CM | POA: Diagnosis not present

## 2021-01-25 ENCOUNTER — Ambulatory Visit: Payer: Medicare Other | Admitting: Neurology

## 2021-01-30 ENCOUNTER — Encounter: Payer: Self-pay | Admitting: Neurology

## 2021-01-30 ENCOUNTER — Other Ambulatory Visit: Payer: Self-pay

## 2021-01-30 ENCOUNTER — Ambulatory Visit: Payer: Medicare Other | Admitting: Neurology

## 2021-01-30 VITALS — BP 160/85 | HR 92 | Ht 64.0 in | Wt 194.0 lb

## 2021-01-30 DIAGNOSIS — G5603 Carpal tunnel syndrome, bilateral upper limbs: Secondary | ICD-10-CM

## 2021-01-30 DIAGNOSIS — R292 Abnormal reflex: Secondary | ICD-10-CM

## 2021-01-30 DIAGNOSIS — M4802 Spinal stenosis, cervical region: Secondary | ICD-10-CM

## 2021-01-30 DIAGNOSIS — G609 Hereditary and idiopathic neuropathy, unspecified: Secondary | ICD-10-CM | POA: Diagnosis not present

## 2021-01-30 NOTE — Progress Notes (Signed)
Follow-up Visit   Date: 01/30/21   Andrea Pittman MRN: [id number] DOB: 18-Mar-1955   Interim History: Andrea Pittman is a 65 y.o. right-handed Caucasian female with anxiety/depression, bilateral CTS release, and GERD returning to the clinic for follow-up of idiopathic neuropathy (2010).  The patient was accompanied to the clinic by self.  History of present illness: She was diagnosed with neuropathy ~2010 by EMG by Dr. Melton Alar.  Symptoms starting with tingling in the soles and over time, involves the entire feet.  She has numbness over the toes and sensitivity over the top of the feet, which is worse on the left.  She has severe pain with light pressure. Pain is worse at night time and has become more constant over time.  Starting earlier this year, she began having burning pain in the hands.  Her water has been tested for heavy metals. No history of diabetes, alcohol abuse, or family history of neuropathy. Prior labs looking for causes of neuropathy were normal. She has also seen Dr. Erling Cruz for second opinion who agreed with her diagnosis of idiopathic neuropathy.   UPDATE 05/02/2020:  She continues to have tingling over the feet.  At her last visit, I recommended tapering off gabapentin and starting Lyrica.  She is currently on Lyrica 50mg  twice daily.  She denies any dizziness or sedation and is interested in increasing the dose.  She also has similar sensation in the hands which has been getting worse.  She also complains of throbbing pain around the left great toe/bunion.   UPDATE 01/30/2021:  She is here for follow-up. She continues to have shooting pain in the fingers.  EMG shows mild CTS, no neuropathy. She endorse neck pain.  Her neuropathy in the feet is unchanged.  She went for a pedicure in September and a few days later, she noticed the her right great toe was blistered and bleeding at the tip of the toe. She went to podiatrist who gave instructions for care.  It has since healed.     Medications:  Current Outpatient Medications on File Prior to Visit  Medication Sig Dispense Refill   acetaminophen (TYLENOL) 500 MG tablet Take 2 tablets (1,000 mg total) by mouth every 6 (six) hours as needed. 30 tablet 0   Cholecalciferol (VITAMIN D) 50 MCG (2000 UT) CAPS Take by mouth.     DULoxetine (CYMBALTA) 60 MG capsule TAKE TWO CAPSULES EACH DAY (Patient taking differently: Take 120 mg by mouth daily.) 90 capsule 3   estradiol (ESTRACE) 2 MG tablet Take 1 mg by mouth daily.     ibuprofen (ADVIL) 200 MG tablet Take 400-600 mg by mouth every 6 (six) hours as needed for fever, headache or mild pain.     omeprazole (PRILOSEC) 20 MG capsule Take 20 mg by mouth daily.     pregabalin (LYRICA) 100 MG capsule TAKE ONE CAPSULE BY MOUTH TWICE A DAY 60 capsule 5   No current facility-administered medications on file prior to visit.    Allergies: No Known Allergies  Vital Signs:  BP (!) 160/85   Pulse 92   Ht 5\' 4"  (1.626 m)   Wt 194 lb (88 kg)   SpO2 96%   BMI 33.30 kg/m     Neurological Exam: MENTAL STATUS including orientation to time, place, person, recent and remote memory, attention span and concentration, language, and fund of knowledge is normal.  Speech is not dysarthric.  CRANIAL NERVES:   Normal conjugate, extra-ocular eye movements in  all directions of gaze.  No ptosis.    MOTOR:  Motor strength is 5/5 in all extremities, including distally in the feet.  No atrophy, fasciculations or abnormal movements.  No pronator drift.  Tone is normal.    MSRs:  Reflexes are 3+/4 throughout and 2+/4 at the ankles.  SENSORY:  Vibration is absent at the great toe bilaterally, reduced at the ankles.     COORDINATION/GAIT:   Gait narrow based and stable.   Data:  NCS/EMG of the arms 06/14/2020: Bilateral median neuropathy at or distal to the wrist, consistent with a clinical diagnosis of carpal tunnel syndrome.  Overall, these findings are mild in degree electrically There is  no evidence of a sensorimotor polyneuropathy affecting the upper extremities.  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  Further recommendations pending results.   Thank you for allowing me to participate in patient's care.  If I can answer any additional questions, I would be pleased to do so.    Sincerely,    Andrea Theissen K. Posey Pronto, DO

## 2021-01-30 NOTE — Patient Instructions (Signed)
MRI cervical spine without contrast  We will call you with the results and determine the next step

## 2021-02-28 ENCOUNTER — Other Ambulatory Visit: Payer: Self-pay | Admitting: Family Medicine

## 2021-02-28 DIAGNOSIS — K219 Gastro-esophageal reflux disease without esophagitis: Secondary | ICD-10-CM

## 2021-03-07 ENCOUNTER — Ambulatory Visit: Payer: Medicare Other | Admitting: Family Medicine

## 2021-03-07 ENCOUNTER — Other Ambulatory Visit: Payer: Self-pay

## 2021-03-07 ENCOUNTER — Ambulatory Visit
Admission: RE | Admit: 2021-03-07 | Discharge: 2021-03-07 | Disposition: A | Discharge status: Home / Self Care | Payer: Medicare Other | Source: Ambulatory Visit | Attending: Neurology | Admitting: Neurology

## 2021-03-07 DIAGNOSIS — R292 Abnormal reflex: Secondary | ICD-10-CM

## 2021-03-07 DIAGNOSIS — M4802 Spinal stenosis, cervical region: Secondary | ICD-10-CM

## 2021-03-07 DIAGNOSIS — M4312 Spondylolisthesis, cervical region: Secondary | ICD-10-CM | POA: Diagnosis not present

## 2021-03-07 DIAGNOSIS — M2578 Osteophyte, vertebrae: Secondary | ICD-10-CM | POA: Diagnosis not present

## 2021-03-08 ENCOUNTER — Ambulatory Visit: Payer: Medicare Other | Admitting: Family Medicine

## 2021-03-09 ENCOUNTER — Telehealth: Payer: Self-pay | Admitting: Neurology

## 2021-03-09 NOTE — Telephone Encounter (Signed)
See result notes Mahina called pt

## 2021-03-09 NOTE — Telephone Encounter (Signed)
Patient left a message she was returning a call to [facility]

## 2021-03-13 ENCOUNTER — Other Ambulatory Visit: Payer: Self-pay | Admitting: Family Medicine

## 2021-03-13 DIAGNOSIS — G609 Hereditary and idiopathic neuropathy, unspecified: Secondary | ICD-10-CM

## 2021-03-16 ENCOUNTER — Encounter: Payer: Self-pay | Admitting: Family Medicine

## 2021-03-16 ENCOUNTER — Ambulatory Visit (INDEPENDENT_AMBULATORY_CARE_PROVIDER_SITE_OTHER): Payer: Medicare Other | Admitting: Family Medicine

## 2021-03-16 VITALS — BP 129/76 | HR 81 | Ht 64.0 in | Wt 194.0 lb

## 2021-03-16 DIAGNOSIS — G609 Hereditary and idiopathic neuropathy, unspecified: Secondary | ICD-10-CM | POA: Diagnosis not present

## 2021-03-16 DIAGNOSIS — R7989 Other specified abnormal findings of blood chemistry: Secondary | ICD-10-CM | POA: Diagnosis not present

## 2021-03-16 DIAGNOSIS — K219 Gastro-esophageal reflux disease without esophagitis: Secondary | ICD-10-CM

## 2021-03-16 DIAGNOSIS — E669 Obesity, unspecified: Secondary | ICD-10-CM | POA: Diagnosis not present

## 2021-03-16 DIAGNOSIS — Z23 Encounter for immunization: Secondary | ICD-10-CM

## 2021-03-16 MED ORDER — OMEPRAZOLE 20 MG PO CPDR: 20.0000 mg | DELAYED_RELEASE_CAPSULE | Freq: Every day | ORAL | 3 refills | Status: DC

## 2021-03-16 MED ORDER — DULOXETINE HCL 60 MG PO CPEP: 120.0000 mg | ORAL_CAPSULE | Freq: Every day | ORAL | 3 refills | Status: DC

## 2021-03-16 NOTE — Progress Notes (Signed)
BP 129/76    Pulse 81    Ht '5\' 4"'  (1.626 m)    Wt 194 lb (88 kg)    SpO2 95%    BMI 33.30 kg/m    Subjective:   Patient ID: Andrea Pittman, female    DOB: 04/30/1955, 66 y.o.   MRN: [id number]  HPI: Andrea Pittman is a 66 y.o. female presenting on 03/16/2021 for Medical Management of Chronic Issues and Peripheral Neuropathy   HPI Peripheral neuropathy and anxiety and depression recheck Patient is coming in for peripheral neuropathy recheck and anxiety depression.  She currently takes Cymbalta and Lyrica although she does not use Lyrica every day and she seems to be doing okay with it.  She still has neuropathy but has not worsened or better.  She did go see neurology and they are doing evaluation such as MRIs and nerve testing.  GERD Patient is currently on omeprazole.  She denies any major symptoms or abdominal pain or belching or burping. She denies any blood in her stool or lightheadedness or dizziness.   Relevant past medical, surgical, family and social history reviewed and updated as indicated. Interim medical history since our last visit reviewed. Allergies and medications reviewed and updated.  Review of Systems  Constitutional:  Negative for chills and fever.  Eyes:  Negative for visual disturbance.  Respiratory:  Negative for chest tightness and shortness of breath.   Cardiovascular:  Negative for chest pain and leg swelling.  Musculoskeletal:  Negative for back pain and gait problem.  Skin:  Negative for rash.  Neurological:  Positive for numbness. Negative for dizziness, light-headedness and headaches.  Psychiatric/Behavioral:  Negative for agitation and behavioral problems.   All other systems reviewed and are negative.  Per HPI unless specifically indicated above   Allergies as of 03/16/2021   No Known Allergies      Medication List        Accurate as of March 16, 2021 11:37 AM. If you have any questions, ask your nurse or doctor.          acetaminophen  500 MG tablet Commonly known as: TYLENOL Take 2 tablets (1,000 mg total) by mouth every 6 (six) hours as needed.   DULoxetine 60 MG capsule Commonly known as: CYMBALTA TAKE TWO CAPSULES EACH DAY (NEEDS TO BE SEEN BEFORE NEXT REFILL)   estradiol 2 MG tablet Commonly known as: ESTRACE Take 1 mg by mouth daily.   ibuprofen 200 MG tablet Commonly known as: ADVIL Take 400-600 mg by mouth every 6 (six) hours as needed for fever, headache or mild pain.   omeprazole 20 MG capsule Commonly known as: PRILOSEC Take 20 mg by mouth daily.   pregabalin 100 MG capsule Commonly known as: LYRICA TAKE ONE CAPSULE BY MOUTH TWICE A DAY   Vitamin D 50 MCG (2000 UT) Caps Take by mouth.         Objective:   BP 129/76    Pulse 81    Ht '5\' 4"'  (1.626 m)    Wt 194 lb (88 kg)    SpO2 95%    BMI 33.30 kg/m   Wt Readings from Last 3 Encounters:  03/16/21 194 lb (88 kg)  01/30/21 194 lb (88 kg)  09/20/20 189 lb (85.7 kg)    Physical Exam Vitals and nursing note reviewed.  Constitutional:      General: She is not in acute distress.    Appearance: She is well-developed. She is not diaphoretic.  Eyes:     Conjunctiva/sclera: Conjunctivae normal.  Cardiovascular:     Rate and Rhythm: Normal rate and regular rhythm.     Heart sounds: Normal heart sounds. No murmur heard. Pulmonary:     Effort: Pulmonary effort is normal. No respiratory distress.     Breath sounds: Normal breath sounds. No wheezing.  Musculoskeletal:        General: No tenderness. Normal range of motion.  Skin:    General: Skin is warm and dry.     Findings: No rash.  Neurological:     Mental Status: She is alert and oriented to person, place, and time.     Coordination: Coordination normal.  Psychiatric:        Behavior: Behavior normal.      Assessment & Plan:   Problem List Items Addressed This Visit       Digestive   GERD (gastroesophageal reflux disease)   Relevant Medications   omeprazole (PRILOSEC) 20 MG  capsule   Other Relevant Orders   CBC with Differential/Platelet   CMP14+EGFR     Nervous and Auditory   Hereditary and idiopathic peripheral neuropathy   Relevant Medications   DULoxetine (CYMBALTA) 60 MG capsule   Other Relevant Orders   CBC with Differential/Platelet   CMP14+EGFR     Other   Obesity (BMI 30.0-34.9)   Relevant Orders   Lipid panel   Other Visit Diagnoses     Elevated LFTs    -  Primary       Patient does not use Lyrica twice a day every day, frequently only takes it once a day and some days not at all and a prescription will last her greater than 2 months.  She still has 3 prescriptions left. Follow up plan: Return in about 6 months (around 09/13/2021), or if symptoms worsen or fail to improve, for Neuropathy and anxiety depression.  Counseling provided for all of the vaccine components Orders Placed This Encounter  Procedures   Tdap vaccine greater than or equal to 7yo IM   Varicella-zoster vaccine IM (Shingrix)    Caryl Pina, MD [facility] 03/16/2021, 11:37 AM

## 2021-03-17 LAB — CMP14+EGFR
ALT: 38 IU/L — ABNORMAL HIGH (ref 0–32)
AST: 36 IU/L (ref 0–40)
Albumin/Globulin Ratio: 1.3 (ref 1.2–2.2)
Albumin: 4 g/dL (ref 3.8–4.8)
Alkaline Phosphatase: 99 IU/L (ref 44–121)
BUN/Creatinine Ratio: 15 (ref 12–28)
BUN: 13 mg/dL (ref 8–27)
Bilirubin Total: 0.2 mg/dL (ref 0.0–1.2)
CO2: 25 mmol/L (ref 20–29)
Calcium: 9.7 mg/dL (ref 8.7–10.3)
Chloride: 101 mmol/L (ref 96–106)
Creatinine, Ser: 0.87 mg/dL (ref 0.57–1.00)
Globulin, Total: 3.1 g/dL (ref 1.5–4.5)
Glucose: 112 mg/dL — ABNORMAL HIGH (ref 70–99)
Potassium: 5 mmol/L (ref 3.5–5.2)
Sodium: 141 mmol/L (ref 134–144)
Total Protein: 7.1 g/dL (ref 6.0–8.5)
eGFR: 73 mL/min/{1.73_m2} (ref >59)

## 2021-03-17 LAB — CBC WITH DIFFERENTIAL/PLATELET
Basophils Absolute: 0.1 10*3/uL (ref 0.0–0.2)
Basos: 1 %
EOS (ABSOLUTE): 0.5 10*3/uL — ABNORMAL HIGH (ref 0.0–0.4)
Eos: 5 %
Hematocrit: 42.2 % (ref 34.0–46.6)
Hemoglobin: 13.8 g/dL (ref 11.1–15.9)
Immature Grans (Abs): 0 10*3/uL (ref 0.0–0.1)
Immature Granulocytes: 0 %
Lymphocytes Absolute: 4.1 10*3/uL — ABNORMAL HIGH (ref 0.7–3.1)
Lymphs: 37 %
MCH: 29.4 pg (ref 26.6–33.0)
MCHC: 32.7 g/dL (ref 31.5–35.7)
MCV: 90 fL (ref 79–97)
Monocytes Absolute: 1 10*3/uL — ABNORMAL HIGH (ref 0.1–0.9)
Monocytes: 9 %
Neutrophils Absolute: 5.2 10*3/uL (ref 1.4–7.0)
Neutrophils: 48 %
Platelets: 388 10*3/uL (ref 150–450)
RBC: 4.7 x10E6/uL (ref 3.77–5.28)
RDW: 13.2 % (ref 11.7–15.4)
WBC: 11.1 10*3/uL — ABNORMAL HIGH (ref 3.4–10.8)

## 2021-03-17 LAB — LIPID PANEL
Chol/HDL Ratio: 4.8 ratio — ABNORMAL HIGH (ref 0.0–4.4)
Cholesterol, Total: 176 mg/dL (ref 100–199)
HDL: 37 mg/dL — ABNORMAL LOW (ref >39)
LDL Chol Calc (NIH): 97 mg/dL (ref 0–99)
Triglycerides: 250 mg/dL — ABNORMAL HIGH (ref 0–149)
VLDL Cholesterol Cal: 42 mg/dL — ABNORMAL HIGH (ref 5–40)

## 2021-03-29 NOTE — Progress Notes (Signed)
Patient calling back. Please return call.  

## 2021-04-13 DIAGNOSIS — K219 Gastro-esophageal reflux disease without esophagitis: Secondary | ICD-10-CM | POA: Diagnosis not present

## 2021-04-13 DIAGNOSIS — R29890 Loss of height: Secondary | ICD-10-CM | POA: Diagnosis not present

## 2021-04-19 DIAGNOSIS — Z1231 Encounter for screening mammogram for malignant neoplasm of breast: Secondary | ICD-10-CM | POA: Diagnosis not present

## 2021-05-05 ENCOUNTER — Other Ambulatory Visit: Payer: Self-pay | Admitting: Family Medicine

## 2021-05-05 DIAGNOSIS — K219 Gastro-esophageal reflux disease without esophagitis: Secondary | ICD-10-CM

## 2021-05-18 DIAGNOSIS — K449 Diaphragmatic hernia without obstruction or gangrene: Secondary | ICD-10-CM | POA: Diagnosis not present

## 2021-05-18 DIAGNOSIS — K219 Gastro-esophageal reflux disease without esophagitis: Secondary | ICD-10-CM | POA: Diagnosis not present

## 2021-05-18 DIAGNOSIS — D121 Benign neoplasm of appendix: Secondary | ICD-10-CM | POA: Diagnosis not present

## 2021-05-18 DIAGNOSIS — K649 Unspecified hemorrhoids: Secondary | ICD-10-CM | POA: Diagnosis not present

## 2021-05-18 DIAGNOSIS — D124 Benign neoplasm of descending colon: Secondary | ICD-10-CM | POA: Diagnosis not present

## 2021-05-18 DIAGNOSIS — K317 Polyp of stomach and duodenum: Secondary | ICD-10-CM | POA: Diagnosis not present

## 2021-05-18 DIAGNOSIS — K5730 Diverticulosis of large intestine without perforation or abscess without bleeding: Secondary | ICD-10-CM | POA: Diagnosis not present

## 2021-05-18 DIAGNOSIS — Z1211 Encounter for screening for malignant neoplasm of colon: Secondary | ICD-10-CM | POA: Diagnosis not present

## 2021-05-18 DIAGNOSIS — D123 Benign neoplasm of transverse colon: Secondary | ICD-10-CM | POA: Diagnosis not present

## 2021-05-23 DIAGNOSIS — D123 Benign neoplasm of transverse colon: Secondary | ICD-10-CM | POA: Diagnosis not present

## 2021-05-23 DIAGNOSIS — D121 Benign neoplasm of appendix: Secondary | ICD-10-CM | POA: Diagnosis not present

## 2021-05-23 DIAGNOSIS — D124 Benign neoplasm of descending colon: Secondary | ICD-10-CM | POA: Diagnosis not present

## 2021-06-01 ENCOUNTER — Other Ambulatory Visit: Payer: Self-pay | Admitting: Family Medicine

## 2021-06-01 DIAGNOSIS — K219 Gastro-esophageal reflux disease without esophagitis: Secondary | ICD-10-CM

## 2021-08-01 DIAGNOSIS — L821 Other seborrheic keratosis: Secondary | ICD-10-CM | POA: Diagnosis not present

## 2021-08-01 DIAGNOSIS — D2262 Melanocytic nevi of left upper limb, including shoulder: Secondary | ICD-10-CM | POA: Diagnosis not present

## 2021-08-01 DIAGNOSIS — D2261 Melanocytic nevi of right upper limb, including shoulder: Secondary | ICD-10-CM | POA: Diagnosis not present

## 2021-08-01 DIAGNOSIS — D2272 Melanocytic nevi of left lower limb, including hip: Secondary | ICD-10-CM | POA: Diagnosis not present

## 2021-08-01 DIAGNOSIS — D2271 Melanocytic nevi of right lower limb, including hip: Secondary | ICD-10-CM | POA: Diagnosis not present

## 2021-08-01 DIAGNOSIS — D1801 Hemangioma of skin and subcutaneous tissue: Secondary | ICD-10-CM | POA: Diagnosis not present

## 2021-08-01 DIAGNOSIS — D225 Melanocytic nevi of trunk: Secondary | ICD-10-CM | POA: Diagnosis not present

## 2021-08-01 DIAGNOSIS — L812 Freckles: Secondary | ICD-10-CM | POA: Diagnosis not present

## 2021-08-01 DIAGNOSIS — L820 Inflamed seborrheic keratosis: Secondary | ICD-10-CM | POA: Diagnosis not present

## 2021-08-08 ENCOUNTER — Other Ambulatory Visit (INDEPENDENT_AMBULATORY_CARE_PROVIDER_SITE_OTHER): Payer: Self-pay

## 2021-08-21 DIAGNOSIS — M2042 Other hammer toe(s) (acquired), left foot: Secondary | ICD-10-CM | POA: Diagnosis not present

## 2021-08-21 DIAGNOSIS — M7672 Peroneal tendinitis, left leg: Secondary | ICD-10-CM | POA: Diagnosis not present

## 2021-08-21 DIAGNOSIS — M25572 Pain in left ankle and joints of left foot: Secondary | ICD-10-CM | POA: Diagnosis not present

## 2021-08-21 DIAGNOSIS — M21612 Bunion of left foot: Secondary | ICD-10-CM | POA: Diagnosis not present

## 2021-08-30 DIAGNOSIS — M25572 Pain in left ankle and joints of left foot: Secondary | ICD-10-CM | POA: Diagnosis not present

## 2021-09-01 ENCOUNTER — Other Ambulatory Visit: Payer: Self-pay | Admitting: Neurology

## 2021-09-20 DIAGNOSIS — M2042 Other hammer toe(s) (acquired), left foot: Secondary | ICD-10-CM | POA: Diagnosis not present

## 2021-09-20 DIAGNOSIS — M21612 Bunion of left foot: Secondary | ICD-10-CM | POA: Diagnosis not present

## 2021-10-02 ENCOUNTER — Ambulatory Visit: Payer: Medicare Other | Admitting: Family Medicine

## 2021-10-25 NOTE — Patient Instructions (Signed)
Our records indicate that you are due for your annual mammogram/breast imaging. While there is no way to prevent breast cancer, early detection provides the best opportunity for curing it. For women over the age of 40, the American Cancer Society recommends a yearly clinical breast exam and a yearly mammogram. These practices have saved thousands of lives. We need your help to ensure that you are receiving optimal medical care. Please call the imaging location that has done you previous mammograms. Please remember to list us as your primary care. This helps make sure we receive a report and can update your chart.  Below is the contact information for several local breast imaging centers. You may call the location that works best for you, and they will be happy to assistance in making you an appointment. You do not need an order for a regular screening mammogram. However, if you are having any problems or concerns with you breast area, please let your primary care provider know, and appropriate orders will be placed. Please let our office know if you have any questions or concerns. Or if you need information for another imaging center not on this list or outside of the area. We are commented to working with you on your health care journey.   The mobile unit/bus ([facility]) - they come twice a month to our location.  These appointments can be made through our office or by call The Breast Center  [facility]  [address] [place], [place] [address] Phone [phone]   Hospital Radiology Department  [address]  [place], [place] [address] [phone]  [facility])  [address] [place], [place] [address] [phone]  [facility] [place]  [address] [place] [address] [phone]  [facility]  [address] [place] [address] [phone]  [facility] [place]  [address] , [place] [address] [phone]  [facility] [address] [place], [place] [address] [phone]  [facility] [address]  [address] , [place] [address] [phone]  [facility] [address] [place] [address] [phone]     

## 2021-11-01 ENCOUNTER — Other Ambulatory Visit: Payer: Self-pay | Admitting: Neurology

## 2021-11-02 ENCOUNTER — Ambulatory Visit (INDEPENDENT_AMBULATORY_CARE_PROVIDER_SITE_OTHER): Payer: Medicare Other | Admitting: Family Medicine

## 2021-11-02 ENCOUNTER — Encounter: Payer: Self-pay | Admitting: Family Medicine

## 2021-11-02 VITALS — BP 118/77 | HR 82 | Temp 98.0°F | Ht 64.0 in | Wt 199.0 lb

## 2021-11-02 DIAGNOSIS — R739 Hyperglycemia, unspecified: Secondary | ICD-10-CM | POA: Diagnosis not present

## 2021-11-02 DIAGNOSIS — E1169 Type 2 diabetes mellitus with other specified complication: Secondary | ICD-10-CM | POA: Insufficient documentation

## 2021-11-02 DIAGNOSIS — G609 Hereditary and idiopathic neuropathy, unspecified: Secondary | ICD-10-CM

## 2021-11-02 DIAGNOSIS — E781 Pure hyperglyceridemia: Secondary | ICD-10-CM | POA: Diagnosis not present

## 2021-11-02 DIAGNOSIS — K219 Gastro-esophageal reflux disease without esophagitis: Secondary | ICD-10-CM

## 2021-11-02 DIAGNOSIS — F419 Anxiety disorder, unspecified: Secondary | ICD-10-CM

## 2021-11-02 DIAGNOSIS — F32A Depression, unspecified: Secondary | ICD-10-CM | POA: Diagnosis not present

## 2021-11-02 LAB — CMP14+EGFR
ALT: 24 IU/L (ref 0–32)
AST: 32 IU/L (ref 0–40)
Albumin/Globulin Ratio: 1.2 (ref 1.2–2.2)
Albumin: 3.7 g/dL — ABNORMAL LOW (ref 3.9–4.9)
Alkaline Phosphatase: 88 IU/L (ref 44–121)
BUN/Creatinine Ratio: 18 (ref 12–28)
BUN: 13 mg/dL (ref 8–27)
Bilirubin Total: 0.3 mg/dL (ref 0.0–1.2)
CO2: 26 mmol/L (ref 20–29)
Calcium: 8.9 mg/dL (ref 8.7–10.3)
Chloride: 101 mmol/L (ref 96–106)
Creatinine, Ser: 0.72 mg/dL (ref 0.57–1.00)
Globulin, Total: 3.1 g/dL (ref 1.5–4.5)
Glucose: 127 mg/dL — ABNORMAL HIGH (ref 70–99)
Potassium: 4.3 mmol/L (ref 3.5–5.2)
Sodium: 139 mmol/L (ref 134–144)
Total Protein: 6.8 g/dL (ref 6.0–8.5)
eGFR: 92 mL/min/{1.73_m2} (ref >59)

## 2021-11-02 LAB — CBC WITH DIFFERENTIAL/PLATELET
Basophils Absolute: 0.2 10*3/uL (ref 0.0–0.2)
Basos: 1 %
EOS (ABSOLUTE): 0.6 10*3/uL — ABNORMAL HIGH (ref 0.0–0.4)
Eos: 5 %
Hematocrit: 39.8 % (ref 34.0–46.6)
Hemoglobin: 13.2 g/dL (ref 11.1–15.9)
Immature Grans (Abs): 0.1 10*3/uL (ref 0.0–0.1)
Immature Granulocytes: 1 %
Lymphocytes Absolute: 3.3 10*3/uL — ABNORMAL HIGH (ref 0.7–3.1)
Lymphs: 30 %
MCH: 29.6 pg (ref 26.6–33.0)
MCHC: 33.2 g/dL (ref 31.5–35.7)
MCV: 89 fL (ref 79–97)
Monocytes Absolute: 1 10*3/uL — ABNORMAL HIGH (ref 0.1–0.9)
Monocytes: 9 %
Neutrophils Absolute: 6.1 10*3/uL (ref 1.4–7.0)
Neutrophils: 54 %
Platelets: 358 10*3/uL (ref 150–450)
RBC: 4.46 x10E6/uL (ref 3.77–5.28)
RDW: 12.5 % (ref 11.7–15.4)
WBC: 11.3 10*3/uL — ABNORMAL HIGH (ref 3.4–10.8)

## 2021-11-02 LAB — LIPID PANEL
Chol/HDL Ratio: 3.8 ratio (ref 0.0–4.4)
Cholesterol, Total: 146 mg/dL (ref 100–199)
HDL: 38 mg/dL — ABNORMAL LOW (ref >39)
LDL Chol Calc (NIH): 70 mg/dL (ref 0–99)
Triglycerides: 233 mg/dL — ABNORMAL HIGH (ref 0–149)
VLDL Cholesterol Cal: 38 mg/dL (ref 5–40)

## 2021-11-02 MED ORDER — OMEPRAZOLE 20 MG PO CPDR: 20.0000 mg | DELAYED_RELEASE_CAPSULE | Freq: Every day | ORAL | 3 refills | Status: DC

## 2021-11-02 MED ORDER — DULOXETINE HCL 60 MG PO CPEP: 120.0000 mg | ORAL_CAPSULE | Freq: Every day | ORAL | 3 refills | Status: DC

## 2021-11-02 NOTE — Telephone Encounter (Signed)
Needs to schedule follow-up visit for additional refills or request through PCP.

## 2021-11-02 NOTE — Progress Notes (Signed)
BP 118/77   Pulse 82   Temp 98 F (36.7 C)   Ht '5\' 4"'  (1.626 m)   Wt 199 lb (90.3 kg)   SpO2 96%   BMI 34.16 kg/m    Subjective:   Patient ID: Andrea Pittman, female    DOB: November 29, 1955, 66 y.o.   MRN: [id number]  HPI: Andrea Pittman is a 66 y.o. female presenting on 11/02/2021 for Medical Management of Chronic Issues and Gastroesophageal Reflux   HPI Hyperlipidemia Patient is coming in for recheck of his hyperlipidemia. The patient is currently taking no medicine currently because has been intolerant. They deny any issues with myalgias or history of liver damage from it. They deny any focal numbness or weakness or chest pain.   GERD Patient is currently on omeprazole.  She denies any major symptoms or abdominal pain or belching or burping. She denies any blood in her stool or lightheadedness or dizziness.   Patient has cerumen impaction on the right ear and feels congested and wants to get it cleaned.  Anxiety depression and neuropathy Patient has anxiety and depression and neuropathy and also sees Dr. Posey Pronto who manages Lyrica and we manage her Cymbalta.  She feels like she is doing well.  She still sees her psychiatrist  Relevant past medical, surgical, family and social history reviewed and updated as indicated. Interim medical history since our last visit reviewed. Allergies and medications reviewed and updated.  Review of Systems  Constitutional:  Negative for chills and fever.  HENT:  Positive for hearing loss.   Eyes:  Negative for visual disturbance.  Respiratory:  Negative for chest tightness and shortness of breath.   Cardiovascular:  Negative for chest pain and leg swelling.  Skin:  Negative for rash.  Neurological:  Negative for light-headedness and headaches.  Psychiatric/Behavioral:  Negative for agitation, behavioral problems, dysphoric mood and sleep disturbance. The patient is not nervous/anxious.   All other systems reviewed and are negative.   Per HPI  unless specifically indicated above   Allergies as of 11/02/2021   No Known Allergies      Medication List        Accurate as of November 02, 2021 10:40 AM. If you have any questions, ask your nurse or doctor.          acetaminophen 500 MG tablet Commonly known as: TYLENOL Take 2 tablets (1,000 mg total) by mouth every 6 (six) hours as needed.   DULoxetine 60 MG capsule Commonly known as: CYMBALTA Take 2 capsules (120 mg total) by mouth daily.   estradiol 2 MG tablet Commonly known as: ESTRACE Take 1 mg by mouth daily.   ibuprofen 200 MG tablet Commonly known as: ADVIL Take 400-600 mg by mouth every 6 (six) hours as needed for fever, headache or mild pain.   omeprazole 20 MG capsule Commonly known as: PRILOSEC Take 1 capsule (20 mg total) by mouth daily.   pregabalin 100 MG capsule Commonly known as: LYRICA TAKE ONE CAPSULE BY MOUTH TWICE A DAY   Vitamin D 50 MCG (2000 UT) Caps Take by mouth.         Objective:   BP 118/77   Pulse 82   Temp 98 F (36.7 C)   Ht '5\' 4"'  (1.626 m)   Wt 199 lb (90.3 kg)   SpO2 96%   BMI 34.16 kg/m   Wt Readings from Last 3 Encounters:  11/02/21 199 lb (90.3 kg)  03/16/21 194 lb (88 kg)  01/30/21 194 lb (88 kg)    Physical Exam Vitals and nursing note reviewed.  Constitutional:      General: She is not in acute distress.    Appearance: She is well-developed. She is not diaphoretic.  HENT:     Right Ear: External ear normal. There is impacted cerumen.     Left Ear: Tympanic membrane, ear canal and external ear normal.  Eyes:     Conjunctiva/sclera: Conjunctivae normal.  Cardiovascular:     Rate and Rhythm: Normal rate and regular rhythm.     Heart sounds: Normal heart sounds. No murmur heard. Pulmonary:     Effort: Pulmonary effort is normal. No respiratory distress.     Breath sounds: Normal breath sounds. No wheezing.  Musculoskeletal:        General: No tenderness. Normal range of motion.  Skin:    General:  Skin is warm and dry.     Findings: No rash.  Neurological:     Mental Status: She is alert and oriented to person, place, and time.     Coordination: Coordination normal.  Psychiatric:        Behavior: Behavior normal.     Nurse to lavage cerumen on the right ear, patient tolerated well.  Able to clear mostly.  Assessment & Plan:   Problem List Items Addressed This Visit       Digestive   GERD (gastroesophageal reflux disease)   Relevant Medications   omeprazole (PRILOSEC) 20 MG capsule   Other Relevant Orders   CMP14+EGFR   CBC with Differential/Platelet   Lipid panel     Nervous and Auditory   Hereditary and idiopathic peripheral neuropathy   Relevant Medications   DULoxetine (CYMBALTA) 60 MG capsule     Other   Anxiety and depression - Primary   Relevant Medications   DULoxetine (CYMBALTA) 60 MG capsule   Other Relevant Orders   CMP14+EGFR   CBC with Differential/Platelet   Hypertriglyceridemia   Relevant Orders   Lipid panel    Patient still having neuropathy but it is stable, recommended continue activity and continue with the medication. Follow up plan: Return in about 6 months (around 05/03/2022), or if symptoms worsen or fail to improve, for Hypertriglyceridemia and GERD and anxiety.  Counseling provided for all of the vaccine components Orders Placed This Encounter  Procedures   CMP14+EGFR   CBC with Differential/Platelet   Lipid panel    Caryl Pina, MD [facility] 11/02/2021, 10:40 AM

## 2021-11-03 ENCOUNTER — Other Ambulatory Visit: Payer: Self-pay

## 2021-11-03 DIAGNOSIS — R3589 Other polyuria: Secondary | ICD-10-CM

## 2021-11-03 DIAGNOSIS — M545 Low back pain, unspecified: Secondary | ICD-10-CM

## 2021-11-03 DIAGNOSIS — R7309 Other abnormal glucose: Secondary | ICD-10-CM

## 2021-11-07 LAB — SPECIMEN STATUS REPORT

## 2021-11-07 LAB — HGB A1C W/O EAG: Hgb A1c MFr Bld: 6.9 % — ABNORMAL HIGH (ref 4.8–5.6)

## 2021-12-07 ENCOUNTER — Telehealth: Payer: Self-pay | Admitting: Pharmacist

## 2021-12-07 ENCOUNTER — Ambulatory Visit (INDEPENDENT_AMBULATORY_CARE_PROVIDER_SITE_OTHER): Payer: Medicare Other | Admitting: Pharmacist

## 2021-12-07 DIAGNOSIS — E119 Type 2 diabetes mellitus without complications: Secondary | ICD-10-CM | POA: Diagnosis not present

## 2021-12-07 NOTE — Telephone Encounter (Signed)
Patient requesting 2nd shingles shot Unsure how to schedule Will route to clinical pools to schedule for nurse triage

## 2021-12-07 NOTE — Telephone Encounter (Signed)
Left message informing pt that she can get her vaccine in a nurse visit or wait until her next visit with Dr. Warrick Parisian.

## 2021-12-07 NOTE — Progress Notes (Signed)
    12/07/2021 Name: Andrea Pittman MRN: [id number] DOB: Jan 22, 1956   S:  66 YOF Presents for NEW ONSET TYPE 2 diabetes evaluation, education, and management.  Patient was referred and last seen by Primary Care Provider on 11/02/21. Patient reports Diabetes was diagnosed in 8/31/.  Insurance coverage/medication affordability: [facility] MEDICARE  Patient reports adherence with medications. Current diabetes medications include: N/A Current hypertension medications include: N/A Goal 130/80 Current hyperlipidemia medications include: N/A     Patient reported dietary habits: Eats 3 meals/day EXTENSIVE DIETARY COUNSELING PROVIDED RECOMMENDED ~40 CARBS PER MEAL 3 MEALS/DAY REVIEWED HEALTHY PLATE METHOD HANDOUTS PROVIDED  Discussed meal planning options and Plate method for healthy eating Avoid sugary drinks and desserts Incorporate balanced protein, non starchy veggies, 1 serving of carbohydrate with each meal Increase water intake Increase physical activity as able  Patient-reported exercise habits: ENCOURAGED   Patient reports nocturia (nighttime urination).  Patient denies neuropathy (nerve pain).  Patient denies visual changes.  Patient denies self foot exams.    O:  Lab Results  Component Value Date   HGBA1C 6.9 (H) 11/02/2021    Lipid Panel     Component Value Date/Time   CHOL 146 11/02/2021 1049   TRIG 233 (H) 11/02/2021 1049   HDL 38 (L) 11/02/2021 1049   CHOLHDL 3.8 11/02/2021 1049   LDLCALC 70 11/02/2021 1049     Home fasting blood sugars: N/A/  2 hour post-meal/random blood sugars: N/A.    Clinical Atherosclerotic Cardiovascular Disease (ASCVD): No   The 10-year ASCVD risk score (Arnett DK, et al., 2019) is: 7.7%   Values used to calculate the score:     Age: 66 years     Sex: Female     Is Non-Hispanic African American: No     Diabetic: No     Tobacco smoker: No     Systolic Blood Pressure: 967 mmHg     Is BP treated: No     HDL Cholesterol: 38  mg/dL     Total Cholesterol: 146 mg/dL    A/P:  New onset type 2 diabetes; A1C 6.8%.  patient declined medication at the time.  Encourage diet/lifestyle changes. Reviewed dietary materials.   -Extensively discussed pathophysiology of diabetes, recommended lifestyle interventions, dietary effects on blood sugar control  -Counseled on s/sx of and management of hypoglycemia  -Next A1C anticipated 3-6 MONTHS-PCP for 04/2022    Written patient instructions provided.  Total time in face to face counseling 30 minutes.     Regina Eck, PharmD, BCPS Clinical Pharmacist, [facility] Phone [phone]

## 2021-12-20 ENCOUNTER — Ambulatory Visit: Payer: Medicare Other | Admitting: Nurse Practitioner

## 2021-12-20 ENCOUNTER — Encounter: Payer: Self-pay | Admitting: Family Medicine

## 2021-12-20 ENCOUNTER — Ambulatory Visit (INDEPENDENT_AMBULATORY_CARE_PROVIDER_SITE_OTHER): Payer: Medicare Other | Admitting: Family Medicine

## 2021-12-20 VITALS — BP 137/79 | HR 91 | Temp 97.8°F | Ht 64.0 in | Wt 196.2 lb

## 2021-12-20 DIAGNOSIS — R300 Dysuria: Secondary | ICD-10-CM

## 2021-12-20 LAB — URINALYSIS, COMPLETE
Bilirubin, UA: NEGATIVE
Leukocytes,UA: NEGATIVE
Nitrite, UA: NEGATIVE
RBC, UA: NEGATIVE
Specific Gravity, UA: 1.02 (ref 1.005–1.030)
Urobilinogen, Ur: 1 mg/dL (ref 0.2–1.0)
pH, UA: 5.5 (ref 5.0–7.5)

## 2021-12-20 LAB — MICROSCOPIC EXAMINATION
Bacteria, UA: NONE SEEN
RBC, Urine: NONE SEEN /hpf (ref 0–2)
Renal Epithel, UA: NONE SEEN /hpf

## 2021-12-20 MED ORDER — FLUCONAZOLE 150 MG PO TABS: 150.0000 mg | ORAL_TABLET | Freq: Once | ORAL | 0 refills | Status: AC

## 2021-12-20 MED ORDER — CIPROFLOXACIN HCL 500 MG PO TABS: 500.0000 mg | ORAL_TABLET | Freq: Two times a day (BID) | ORAL | 0 refills | Status: DC

## 2021-12-20 NOTE — Progress Notes (Signed)
Subjective:  Patient ID: Andrea Pittman, female    DOB: 31-Dec-1955  Age: 66 y.o. MRN: [id number]  CC: Dysuria   HPI Andrea Pittman presents for burning with urination and frequency for several days. Denies fever . Right flank pain. No nausea, vomiting. Urine dark for a couple of weeks. Right flank pain onset yesterday. Increased through the day. Some radiation to the right lower quadrant.       12/20/2021    9:56 AM 12/20/2021    9:40 AM 11/02/2021   10:48 AM  Depression screen PHQ 2/9  Decreased Interest 1 0 1  Down, Depressed, Hopeless 1 0 0  PHQ - 2 Score 2 0 1  Altered sleeping 2    Tired, decreased energy 2    Change in appetite 0    Feeling bad or failure about yourself  0    Trouble concentrating 1    Moving slowly or fidgety/restless 0    Suicidal thoughts 0    PHQ-9 Score 7    Difficult doing work/chores Somewhat difficult      History Andrea Pittman has a past medical history of Anxiety, Depression, and GERD (gastroesophageal reflux disease).   She has a past surgical history that includes Abdominal hysterectomy; Bilateral carpal tunnel release; bladder tack; Cholecystectomy (N/A, 09/23/2020); ERCP (N/A, 09/22/2020); removal of stones (09/22/2020); and sphincterotomy (09/22/2020).   Her family history includes Cancer in her mother.She reports that she has never smoked. She has never used smokeless tobacco. She reports that she does not drink alcohol and does not use drugs.    ROS Review of Systems  Constitutional:  Negative for fever.  HENT:  Negative for congestion, rhinorrhea and sore throat.   Respiratory:  Negative for cough and shortness of breath.   Cardiovascular:  Negative for chest pain and palpitations.  Gastrointestinal:  Negative for abdominal pain.  Genitourinary:  Positive for difficulty urinating, dysuria, flank pain, frequency and urgency.  Musculoskeletal:  Negative for arthralgias and myalgias.    Objective:  BP 137/79   Pulse 91   Temp 97.8 F (36.6  C)   Ht '5\' 4"'$  (1.626 m)   Wt 196 lb 3.2 oz (89 kg)   SpO2 97%   BMI 33.68 kg/m   BP Readings from Last 3 Encounters:  12/20/21 137/79  11/02/21 118/77  03/16/21 129/76    Wt Readings from Last 3 Encounters:  12/20/21 196 lb 3.2 oz (89 kg)  11/02/21 199 lb (90.3 kg)  03/16/21 194 lb (88 kg)     Physical Exam Constitutional:      General: She is not in acute distress.    Appearance: She is well-developed.  Cardiovascular:     Rate and Rhythm: Normal rate and regular rhythm.  Pulmonary:     Breath sounds: Normal breath sounds.  Abdominal:     Tenderness: There is abdominal tenderness (right flank).  Musculoskeletal:        General: Normal range of motion.  Skin:    General: Skin is warm and dry.  Neurological:     Mental Status: She is alert and oriented to person, place, and time.       Assessment & Plan:   Andrea Pittman was seen today for dysuria.  Diagnoses and all orders for this visit:  Dysuria -     Urinalysis, Complete -     Urine Culture  Other orders -     ciprofloxacin (CIPRO) 500 MG tablet; Take 1 tablet (500 mg total) by mouth  2 (two) times daily. -     fluconazole (DIFLUCAN) 150 MG tablet; Take 1 tablet (150 mg total) by mouth once for 1 dose. At onset of symptoms. Repeat at end of treatment       I am having Andrea Pittman start on ciprofloxacin and fluconazole. I am also having her maintain her estradiol, ibuprofen, acetaminophen, Vitamin D, pregabalin, omeprazole, and DULoxetine.  Allergies as of 12/20/2021   No Known Allergies      Medication List        Accurate as of December 20, 2021 10:20 AM. If you have any questions, ask your nurse or doctor.          acetaminophen 500 MG tablet Commonly known as: TYLENOL Take 2 tablets (1,000 mg total) by mouth every 6 (six) hours as needed.   ciprofloxacin 500 MG tablet Commonly known as: Cipro Take 1 tablet (500 mg total) by mouth 2 (two) times daily. Started by: Claretta Fraise, MD    DULoxetine 60 MG capsule Commonly known as: CYMBALTA Take 2 capsules (120 mg total) by mouth daily.   estradiol 2 MG tablet Commonly known as: ESTRACE Take 1 mg by mouth daily.   fluconazole 150 MG tablet Commonly known as: DIFLUCAN Take 1 tablet (150 mg total) by mouth once for 1 dose. At onset of symptoms. Repeat at end of treatment Started by: Claretta Fraise, MD   ibuprofen 200 MG tablet Commonly known as: ADVIL Take 400-600 mg by mouth every 6 (six) hours as needed for fever, headache or mild pain.   omeprazole 20 MG capsule Commonly known as: PRILOSEC Take 1 capsule (20 mg total) by mouth daily.   pregabalin 100 MG capsule Commonly known as: LYRICA TAKE ONE CAPSULE BY MOUTH TWICE A DAY   Vitamin D 50 MCG (2000 UT) Caps Take by mouth.         Follow-up: No follow-ups on file.  Claretta Fraise, M.D.

## 2021-12-22 LAB — URINE CULTURE

## 2022-03-23 ENCOUNTER — Other Ambulatory Visit: Payer: Self-pay | Admitting: Neurology

## 2022-04-13 ENCOUNTER — Ambulatory Visit: Payer: Medicare Other

## 2022-05-03 ENCOUNTER — Ambulatory Visit: Payer: Medicare Other | Admitting: Family Medicine

## 2022-06-01 ENCOUNTER — Other Ambulatory Visit: Payer: Self-pay | Admitting: Neurology

## 2022-06-07 ENCOUNTER — Telehealth: Payer: Self-pay | Admitting: Neurology

## 2022-06-07 MED ORDER — PREGABALIN 100 MG PO CAPS: 100.0000 mg | ORAL_CAPSULE | Freq: Two times a day (BID) | ORAL | 0 refills | Status: DC

## 2022-06-07 NOTE — Telephone Encounter (Signed)
Called patient and informed her a 10 day supply was sent to her pharmacy to last until her follow up appt. Patient verbalized understanding and had no further questions or concerns.

## 2022-06-07 NOTE — Telephone Encounter (Signed)
I will send a 10-day supply to get her to her follow-up visit.

## 2022-06-07 NOTE — Telephone Encounter (Signed)
Patient needs a refill on Lyrica  she has 5 pills left  She has appt on 06-19-22 with Posey Pronto   She is the drug store in [place]   Please call patient to let her know if we were able to call it in for her

## 2022-06-19 ENCOUNTER — Encounter: Payer: Self-pay | Admitting: Neurology

## 2022-06-19 ENCOUNTER — Ambulatory Visit: Payer: Medicare Other | Admitting: Neurology

## 2022-06-19 VITALS — BP 127/80 | HR 60 | Ht 64.0 in | Wt 196.0 lb

## 2022-06-19 DIAGNOSIS — M4802 Spinal stenosis, cervical region: Secondary | ICD-10-CM

## 2022-06-19 DIAGNOSIS — G609 Hereditary and idiopathic neuropathy, unspecified: Secondary | ICD-10-CM

## 2022-06-19 MED ORDER — PREGABALIN 100 MG PO CAPS: 100.0000 mg | ORAL_CAPSULE | Freq: Two times a day (BID) | ORAL | 5 refills | Status: DC

## 2022-06-19 NOTE — Progress Notes (Signed)
Follow-up Visit   Date: 06/19/22   FLYNN LININGER MRN: [id number] DOB: 09/15/1955   Interim History: Andrea Pittman is a 67 y.o. right-handed Caucasian female with anxiety/depression, bilateral CTS release, and GERD returning to the clinic for follow-up of idiopathic neuropathy (2010).  The patient was accompanied to the clinic by self.  History of present illness: She was diagnosed with neuropathy ~2010 by EMG by Dr. Vela Prose.  Symptoms starting with tingling in the soles and over time, involves the entire feet.  She has numbness over the toes and sensitivity over the top of the feet, which is worse on the left.  She has severe pain with light pressure. Pain is worse at night time and has become more constant over time.  Starting earlier this year, she began having burning pain in the hands.  Her water has been tested for heavy metals. No history of diabetes, alcohol abuse, or family history of neuropathy. Prior labs looking for causes of neuropathy were normal. She has also seen Dr. Sandria Manly for second opinion who agreed with her diagnosis of idiopathic neuropathy.   UPDATE 05/02/2020:  She continues to have tingling over the feet.  At her last visit, I recommended tapering off gabapentin and starting Lyrica.  She is currently on Lyrica  twice daily.  She denies any dizziness or sedation and is interested in increasing the dose.  She also has similar sensation in the hands which has been getting worse.  She also complains of throbbing pain around the left great toe/bunion.   UPDATE 01/30/2021:  She is here for follow-up. She continues to have shooting pain in the fingers.  EMG shows mild CTS, no neuropathy. She endorse neck pain.  Her neuropathy in the feet is unchanged.  She went for a pedicure in September and a few days later, she noticed the her right great toe was blistered and bleeding at the tip of the toe. She went to podiatrist who gave instructions for care.  It has since healed.    UPDATE 06/19/2022:  She was last seen in 2022.  She is here for refills on Lyrica. Since this time, she reports ongoing shooting pain in the hands and feet.  She takes Lyrica  daily and  at bedtime as needed.  She is very sleepy when she takes it twice daily.  She also reports to gaining 30lb. However, she does endorse that Lyrica helps control her pain, especially when taking it with Cymbalta /d.    Starting in 2024, she began having shooting pain over the left side of the foot and into the last few toes.  Pain can be severe when it occurs.  She denies low back pain or radicular leg pain.  She is scheduled to have bunion surgery and has pain at the base of her foot.    Medications:  Current Outpatient Medications on File Prior to Visit  Medication Sig Dispense Refill   acetaminophen (TYLENOL) 500 MG tablet Take 2 tablets (1,000 mg total) by mouth every 6 (six) hours as needed. 30 tablet 0   Cholecalciferol (VITAMIN D) 50 MCG (2000 UT) CAPS Take by mouth.     DULoxetine (CYMBALTA) 60 MG capsule Take 2 capsules (120 mg total) by mouth daily. 180 capsule 3   estradiol (ESTRACE) 2 MG tablet Take 1 mg by mouth daily.     ibuprofen (ADVIL) 200 MG tablet Take 400-600 mg by mouth every 6 (six) hours as needed for fever, headache or mild  pain.     omeprazole (PRILOSEC) 20 MG capsule Take 1 capsule (20 mg total) by mouth daily. 90 capsule 3   pregabalin (LYRICA) 100 MG capsule Take 1 capsule (100 mg total) by mouth 2 (two) times daily. 20 capsule 0   No current facility-administered medications on file prior to visit.    Allergies: No Known Allergies  Vital Signs:  BP 127/80   Pulse 60   Ht  (1.626 m)   Wt 196 lb (88.9 kg)   SpO2 98%   BMI 33.64 kg/m     Neurological Exam: MENTAL STATUS including orientation to time, place, person, recent and remote memory, attention span and concentration, language, and fund of knowledge is normal.  Speech is not dysarthric.  CRANIAL  NERVES:   Normal conjugate, extra-ocular eye movements in all directions of gaze.  No ptosis.    MOTOR:  Motor strength is 5/5 in all extremities, including distally in the feet.  No atrophy, fasciculations or abnormal movements.  No pronator drift.  Tone is normal.    MSRs:  Reflexes are 3+/4 throughout and 2+/4 at the ankles.  SENSORY:  Vibration is absent at the great toe bilaterally, reduced at the ankles.     COORDINATION/GAIT:   Gait appears antalgic due to left foot pain, stable and unassisted.   Data:  NCS/EMG of the arms 06/14/2020: Bilateral median neuropathy at or distal to the wrist, consistent with a clinical diagnosis of carpal tunnel syndrome.  Overall, these findings are mild in degree electrically There is no evidence of a sensorimotor polyneuropathy affecting the upper extremities.  MRI cervical spine 03/08/2021: 1. Left paracentral disc protrusion at C5-6 with resultant mild flattening of the left hemi cord, but with no cord signal changes or significant spinal stenosis. 2. Left eccentric disc osteophyte complex at C6-7 with resultant mild cord flattening and mild left greater than right C7 foraminal stenosis. 3. Mild to moderate multilevel facet hypertrophy as above. Finding could contribute to underlying neck pain.    IMPRESSION/PLAN: Cervical spondylosis with moderate canal stenosis at at C5-6 and C6-7.  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  She may request future refills from PCP as her dose is unchanged, otherwise follow-up with me in 1 year  4.  Left foot paresthesias could be worsening  neuropathy vs S1 radiculopathy.    - Continue to monitor  - NCS/EMG if symptoms get worse  Total time spent reviewing records, interview, history/exam, documentation, and coordination of care on day of encounter:  35 min   Thank you for allowing me to participate in patient's care.  If I can answer any additional questions, I would be pleased to do so.    Sincerely,    Aj Crunkleton K. Allena Katz, DO

## 2022-06-19 NOTE — Patient Instructions (Signed)
You may request future refills from PCP as dose is unchanged, otherwise follow-up with me in 1 year

## 2022-07-11 DIAGNOSIS — M2042 Other hammer toe(s) (acquired), left foot: Secondary | ICD-10-CM | POA: Diagnosis not present

## 2022-07-11 DIAGNOSIS — M21612 Bunion of left foot: Secondary | ICD-10-CM | POA: Diagnosis not present

## 2022-07-25 ENCOUNTER — Ambulatory Visit: Payer: Medicare Other

## 2022-07-31 DIAGNOSIS — M7742 Metatarsalgia, left foot: Secondary | ICD-10-CM | POA: Diagnosis not present

## 2022-07-31 DIAGNOSIS — M21612 Bunion of left foot: Secondary | ICD-10-CM | POA: Diagnosis not present

## 2022-07-31 DIAGNOSIS — M2042 Other hammer toe(s) (acquired), left foot: Secondary | ICD-10-CM | POA: Diagnosis not present

## 2022-07-31 DIAGNOSIS — G8918 Other acute postprocedural pain: Secondary | ICD-10-CM | POA: Diagnosis not present

## 2022-08-08 ENCOUNTER — Ambulatory Visit: Payer: Medicare Other | Admitting: Family Medicine

## 2022-08-13 ENCOUNTER — Encounter: Payer: Self-pay | Admitting: *Deleted

## 2022-08-30 DIAGNOSIS — L812 Freckles: Secondary | ICD-10-CM | POA: Diagnosis not present

## 2022-08-30 DIAGNOSIS — L720 Epidermal cyst: Secondary | ICD-10-CM | POA: Diagnosis not present

## 2022-08-30 DIAGNOSIS — L821 Other seborrheic keratosis: Secondary | ICD-10-CM | POA: Diagnosis not present

## 2022-08-30 DIAGNOSIS — D1801 Hemangioma of skin and subcutaneous tissue: Secondary | ICD-10-CM | POA: Diagnosis not present

## 2022-09-13 ENCOUNTER — Ambulatory Visit: Payer: Medicare Other

## 2022-09-13 VITALS — Ht 64.0 in | Wt 195.0 lb

## 2022-09-13 DIAGNOSIS — Z0000 Encounter for general adult medical examination without abnormal findings: Secondary | ICD-10-CM

## 2022-09-13 NOTE — Patient Instructions (Signed)
Andrea Pittman , Thank you for taking time to come for your Medicare Wellness Visit. I appreciate your ongoing commitment to your health goals. Please review the following plan we discussed and let me know if I can assist you in the future.   These are the goals we discussed:  Goals      DIET - INCREASE WATER INTAKE     Patient Stated     11/09/2020 AWV Goal: Fall Prevention  Over the next year, patient will decrease their risk for falls by: Using assistive devices, such as a cane or walker, as needed Identifying fall risks within their home and correcting them by: Removing throw rugs Adding handrails to stairs or ramps Removing clutter and keeping a clear pathway throughout the home Increasing light, especially at night Adding shower handles/bars Raising toilet seat Identifying potential personal risk factors for falls: Medication side effects Incontinence/urgency Vestibular dysfunction Hearing loss Musculoskeletal disorders Neurological disorders Orthostatic hypotension          This is a list of the screening recommended for you and due dates:  Health Maintenance  Topic Date Due   Pneumonia Vaccine (2 of 2 - PPSV23 or PCV20) 02/18/2021   Zoster (Shingles) Vaccine (2 of 2) 05/11/2021   COVID-19 Vaccine (1 - 2023-24 season) Never done   Mammogram  04/19/2022   Flu Shot  10/04/2022   Medicare Annual Wellness Visit  09/13/2023   DTaP/Tdap/Td vaccine (4 - Td or Tdap) 03/17/2031   Colon Cancer Screening  11/30/2031   DEXA scan (bone density measurement)  Completed   Hepatitis C Screening  Completed   HPV Vaccine  Aged Out    Advanced directives: Advance directive discussed with you today. I have provided a copy for you to complete at home and have notarized. Once this is complete please bring a copy in to our office so we can scan it into your chart. Information on Advanced Care Planning can be found at [facility] of [place] Advance Health Care Directives Advance  Health Care Directives ([email or web address]    Conditions/risks identified: Aim for 30 minutes of exercise or brisk walking, 6-8 glasses of water, and 5 servings of fruits and vegetables each day.   Next appointment: Follow up in one year for your annual wellness visit    Preventive Care 65 Years and Older, Female Preventive care refers to lifestyle choices and visits with your health care provider that can promote health and wellness. What does preventive care include? A yearly physical exam. This is also called an annual well check. Dental exams once or twice a year. Routine eye exams. Ask your health care provider how often you should have your eyes checked. Personal lifestyle choices, including: Daily care of your teeth and gums. Regular physical activity. Eating a healthy diet. Avoiding tobacco and drug use. Limiting alcohol use. Practicing safe sex. Taking low-dose aspirin every day. Taking vitamin and mineral supplements as recommended by your health care provider. What happens during an annual well check? The services and screenings done by your health care provider during your annual well check will depend on your age, overall health, lifestyle risk factors, and family history of disease. Counseling  Your health care provider may ask you questions about your: Alcohol use. Tobacco use. Drug use. Emotional well-being. Home and relationship well-being. Sexual activity. Eating habits. History of falls. Memory and ability to understand (cognition). Work and work Astronomer. Reproductive health. Screening  You may have the following tests or measurements: Height, weight, and  BMI. Blood pressure. Lipid and cholesterol levels. These may be checked every 5 years, or more frequently if you are over 30 years old. Skin check. Lung cancer screening. You may have this screening every year starting at age 26 if you have a 30-pack-year history of smoking and currently smoke or have quit  within the past 15 years. Fecal occult blood test (FOBT) of the stool. You may have this test every year starting at age 39. Flexible sigmoidoscopy or colonoscopy. You may have a sigmoidoscopy every 5 years or a colonoscopy every 10 years starting at age 53. Hepatitis C blood test. Hepatitis B blood test. Sexually transmitted disease (STD) testing. Diabetes screening. This is done by checking your blood sugar (glucose) after you have not eaten for a while (fasting). You may have this done every 1-3 years. Bone density scan. This is done to screen for osteoporosis. You may have this done starting at age 66. Mammogram. This may be done every 1-2 years. Talk to your health care provider about how often you should have regular mammograms. Talk with your health care provider about your test results, treatment options, and if necessary, the need for more tests. Vaccines  Your health care provider may recommend certain vaccines, such as: Influenza vaccine. This is recommended every year. Tetanus, diphtheria, and acellular pertussis (Tdap, Td) vaccine. You may need a Td booster every 10 years. Zoster vaccine. You may need this after age 65. Pneumococcal 13-valent conjugate (PCV13) vaccine. One dose is recommended after age 13. Pneumococcal polysaccharide (PPSV23) vaccine. One dose is recommended after age 30. Talk to your health care provider about which screenings and vaccines you need and how often you need them. This information is not intended to replace advice given to you by your health care provider. Make sure you discuss any questions you have with your health care provider. Document Released: 03/18/2015 Document Revised: 11/09/2015 Document Reviewed: 12/21/2014 Elsevier Interactive Patient Education  2017 [employer].  Fall Prevention in the Home Falls can cause injuries. They can happen to people of all ages. There are many things you can do to make your home safe and to help prevent  falls. What can I do on the outside of my home? Regularly fix the edges of walkways and driveways and fix any cracks. Remove anything that might make you trip as you walk through a door, such as a raised step or threshold. Trim any bushes or trees on the path to your home. Use bright outdoor lighting. Clear any walking paths of anything that might make someone trip, such as rocks or tools. Regularly check to see if handrails are loose or broken. Make sure that both sides of any steps have handrails. Any raised decks and porches should have guardrails on the edges. Have any leaves, snow, or ice cleared regularly. Use sand or salt on walking paths during winter. Clean up any spills in your garage right away. This includes oil or grease spills. What can I do in the bathroom? Use night lights. Install grab bars by the toilet and in the tub and shower. Do not use towel bars as grab bars. Use non-skid mats or decals in the tub or shower. If you need to sit down in the shower, use a plastic, non-slip stool. Keep the floor dry. Clean up any water that spills on the floor as soon as it happens. Remove soap buildup in the tub or shower regularly. Attach bath mats securely with double-sided non-slip rug tape. Do not  have throw rugs and other things on the floor that can make you trip. What can I do in the bedroom? Use night lights. Make sure that you have a light by your bed that is easy to reach. Do not use any sheets or blankets that are too big for your bed. They should not hang down onto the floor. Have a firm chair that has side arms. You can use this for support while you get dressed. Do not have throw rugs and other things on the floor that can make you trip. What can I do in the kitchen? Clean up any spills right away. Avoid walking on wet floors. Keep items that you use a lot in easy-to-reach places. If you need to reach something above you, use a strong step stool that has a grab  bar. Keep electrical cords out of the way. Do not use floor polish or wax that makes floors slippery. If you must use wax, use non-skid floor wax. Do not have throw rugs and other things on the floor that can make you trip. What can I do with my stairs? Do not leave any items on the stairs. Make sure that there are handrails on both sides of the stairs and use them. Fix handrails that are broken or loose. Make sure that handrails are as long as the stairways. Check any carpeting to make sure that it is firmly attached to the stairs. Fix any carpet that is loose or worn. Avoid having throw rugs at the top or bottom of the stairs. If you do have throw rugs, attach them to the floor with carpet tape. Make sure that you have a light switch at the top of the stairs and the bottom of the stairs. If you do not have them, ask someone to add them for you. What else can I do to help prevent falls? Wear shoes that: Do not have high heels. Have rubber bottoms. Are comfortable and fit you well. Are closed at the toe. Do not wear sandals. If you use a stepladder: Make sure that it is fully opened. Do not climb a closed stepladder. Make sure that both sides of the stepladder are locked into place. Ask someone to hold it for you, if possible. Clearly mark and make sure that you can see: Any grab bars or handrails. First and last steps. Where the edge of each step is. Use tools that help you move around (mobility aids) if they are needed. These include: Canes. Walkers. Scooters. Crutches. Turn on the lights when you go into a dark area. Replace any light bulbs as soon as they burn out. Set up your furniture so you have a clear path. Avoid moving your furniture around. If any of your floors are uneven, fix them. If there are any pets around you, be aware of where they are. Review your medicines with your doctor. Some medicines can make you feel dizzy. This can increase your chance of falling. Ask  your doctor what other things that you can do to help prevent falls. This information is not intended to replace advice given to you by your health care provider. Make sure you discuss any questions you have with your health care provider. Document Released: 12/16/2008 Document Revised: 07/28/2015 Document Reviewed: 03/26/2014 Elsevier Interactive Patient Education  2017 [employer].

## 2022-09-13 NOTE — Progress Notes (Signed)
Subjective:   Andrea Pittman is a 67 y.o. female who presents for Medicare Annual (Subsequent) preventive examination.  Visit Complete: Virtual  I connected with  Andrea Pittman on 09/13/22 by a audio enabled telemedicine application and verified that I am speaking with the correct person using two identifiers.  Patient Location: Home  Provider Location: Home Office  I discussed the limitations of evaluation and management by telemedicine. The patient expressed understanding and agreed to proceed.  Patient Medicare AWV questionnaire was completed by the patient on 07/011/2024; I have confirmed that all information answered by patient is correct and no changes since this date.  Review of Systems     Cardiac Risk Factors include: advanced age (>37men, >50 women);dyslipidemia     Objective:    Today's Vitals   09/13/22 1417  Weight: 195 lb (88.5 kg)  Height: 5\' 4"  (1.626 m)   Body mass index is 33.47 kg/m.     09/13/2022    2:21 PM 06/19/2022    9:27 AM 01/30/2021   11:09 AM 11/09/2020   11:37 AM 09/22/2020   10:01 AM 09/20/2020    6:38 PM 09/19/2020    9:39 PM  Advanced Directives  Does Patient Have a Medical Advance Directive? No No No No No No No  Would patient like information on creating a medical advance directive? Yes (MAU/Ambulatory/Procedural Areas - Information given)   No - Patient declined No - Patient declined No - Patient declined     Current Medications (verified) Outpatient Encounter Medications as of 09/13/2022  Medication Sig   acetaminophen (TYLENOL) 500 MG tablet Take 2 tablets (1,000 mg total) by mouth every 6 (six) hours as needed.   Cholecalciferol (VITAMIN D) 50 MCG (2000 UT) CAPS Take by mouth.   DULoxetine (CYMBALTA) 60 MG capsule Take 2 capsules (120 mg total) by mouth daily.   estradiol (ESTRACE) 2 MG tablet Take 1 mg by mouth daily.   ibuprofen (ADVIL) 200 MG tablet Take 400-600 mg by mouth every 6 (six) hours as needed for fever, headache or mild  pain.   omeprazole (PRILOSEC) 20 MG capsule Take 1 capsule (20 mg total) by mouth daily.   pregabalin (LYRICA) 100 MG capsule Take 1 capsule (100 mg total) by mouth 2 (two) times daily.   No facility-administered encounter medications on file as of 09/13/2022.    Allergies (verified) Patient has no known allergies.   History: Past Medical History:  Diagnosis Date   Anxiety    Depression    GERD (gastroesophageal reflux disease)    Past Surgical History:  Procedure Laterality Date   ABDOMINAL HYSTERECTOMY     BILATERAL CARPAL TUNNEL RELEASE     bladder tack     CHOLECYSTECTOMY N/A 09/23/2020   Procedure: LAPAROSCOPIC CHOLECYSTECTOMY;  Surgeon: Manus Rudd, MD;  Location: [facility] OR;  Service: General;  Laterality: N/A;   ERCP N/A 09/22/2020   Procedure: ENDOSCOPIC RETROGRADE CHOLANGIOPANCREATOGRAPHY (ERCP);  Surgeon: Vida Rigger, MD;  Location: [facility] ENDOSCOPY;  Service: Endoscopy;  Laterality: N/A;   REMOVAL OF STONES  09/22/2020   Procedure: REMOVAL OF STONES;  Surgeon: Vida Rigger, MD;  Location: [facility] ENDOSCOPY;  Service: Endoscopy;;   SPHINCTEROTOMY  09/22/2020   Procedure: Dennison Mascot;  Surgeon: Vida Rigger, MD;  Location: [facility] ENDOSCOPY;  Service: Endoscopy;;   Family History  Problem Relation Age of Onset   Cancer Mother    Social History   Socioeconomic History   Marital status: Married    Spouse name: Not on file  Number of children: Not on file   Years of education: Not on file   Highest education level: Not on file  Occupational History   Not on file  Tobacco Use   Smoking status: Never   Smokeless tobacco: Never  Vaping Use   Vaping status: Never Used  Substance and Sexual Activity   Alcohol use: No   Drug use: No   Sexual activity: Not on file  Other Topics Concern   Not on file  Social History Narrative   She does not work and watches her grandson.    Right handed    Lives in one story home with a basement.   Social Determinants of Health   Financial  Resource Strain: Low Risk  (09/13/2022)   Overall Financial Resource Strain (CARDIA)    Difficulty of Paying Living Expenses: Not hard at all  Food Insecurity: No Food Insecurity (09/13/2022)   Hunger Vital Sign    Worried About Running Out of Food in the Last Year: Never true    Ran Out of Food in the Last Year: Never true  Transportation Needs: No Transportation Needs (09/13/2022)   PRAPARE - Administrator, Civil Service (Medical): No    Lack of Transportation (Non-Medical): No  Physical Activity: Inactive (09/13/2022)   Exercise Vital Sign    Days of Exercise per Week: 0 days    Minutes of Exercise per Session: 0 min  Stress: No Stress Concern Present (09/13/2022)   [employer] of Occupational Health - Occupational Stress Questionnaire    Feeling of Stress : Not at all  Social Connections: Socially Integrated (09/13/2022)   Social Connection and Isolation Panel [NHANES]    Frequency of Communication with Friends and Family: More than three times a week    Frequency of Social Gatherings with Friends and Family: More than three times a week    Attends Religious Services: More than 4 times per year    Active Member of [employer] or Organizations: Yes    Attends Engineer, structural: More than 4 times per year    Marital Status: Married    Tobacco Counseling Counseling given: Not Answered   Clinical Intake:  Pre-visit preparation completed: Yes  Pain : No/denies pain     Nutritional Risks: None Diabetes: No  How often do you need to have someone help you when you read instructions, pamphlets, or other written materials from your doctor or pharmacy?: 1 - Never  Interpreter Needed?: No  Information entered by :: Renie Ora, LPN   Activities of Daily Living    09/13/2022    2:21 PM  In your present state of health, do you have any difficulty performing the following activities:  Hearing? 0  Vision? 0  Difficulty concentrating or making decisions?  0  Walking or climbing stairs? 0  Dressing or bathing? 0  Doing errands, shopping? 0  Preparing Food and eating ? N  Using the Toilet? N  In the past six months, have you accidently leaked urine? N  Do you have problems with loss of bowel control? N  Managing your Medications? N  Managing your Finances? N  Housekeeping or managing your Housekeeping? N    Patient Care Team: Dettinger, Elige Radon, MD as PCP - General (Family Medicine) Glendale Chard, DO as Consulting Physician (Neurology)  Indicate any recent Medical Services you may have received from other than [facility] providers in the past year (date may be approximate).     Assessment:  This is a routine wellness examination for Andrea Pittman.  Hearing/Vision screen Vision Screening - Comments:: Wears rx glasses - up to date with routine eye exams with  Dr.Johnson   Dietary issues and exercise activities discussed:     Goals Addressed             This Visit's Progress    DIET - INCREASE WATER INTAKE         Depression Screen    09/13/2022    2:20 PM 12/20/2021    9:56 AM 12/20/2021    9:40 AM 11/02/2021   10:48 AM 03/16/2021   11:29 AM 03/16/2021   11:02 AM 11/09/2020   11:41 AM  PHQ 2/9 Scores  PHQ - 2 Score 0 2 0 1 2 0 0  PHQ- 9 Score  7   6      Fall Risk    09/13/2022    2:18 PM 06/19/2022    9:26 AM 12/20/2021    9:40 AM 11/02/2021   10:48 AM 03/16/2021   11:29 AM  Fall Risk   Falls in the past year? 0 1 0 0 0  Number falls in past yr: 0 1     Injury with Fall? 0 1     Risk for fall due to : No Fall Risks      Follow up Falls prevention discussed Falls evaluation completed       MEDICARE RISK AT HOME:  Medicare Risk at Home - 09/13/22 1418     Any stairs in or around the home? Yes    If so, are there any without handrails? No    Home free of loose throw rugs in walkways, pet beds, electrical cords, etc? Yes    Adequate lighting in your home to reduce risk of falls? Yes    Life alert? No    Use of a cane,  walker or w/c? No    Grab bars in the bathroom? Yes    Shower chair or bench in shower? Yes    Elevated toilet seat or a handicapped toilet? Yes             TIMED UP AND GO:  Was the test performed?  No    Cognitive Function:        09/13/2022    2:21 PM 11/09/2020   11:39 AM  6CIT Screen  What Year? 0 points 0 points  What month? 0 points 0 points  What time? 0 points 0 points  Count back from 20 0 points 0 points  Months in reverse 0 points 0 points  Repeat phrase 0 points 0 points  Total Score 0 points 0 points    Immunizations Immunization History  Administered Date(s) Administered   Pneumococcal Conjugate-13 02/19/2020   Td 07/10/2010   Tdap 07/10/2010, 03/16/2021   Zoster Recombinant(Shingrix) 03/16/2021    TDAP status: Up to date  Flu Vaccine status: Declined, Education has been provided regarding the importance of this vaccine but patient still declined. Advised may receive this vaccine at local pharmacy or Health Dept. Aware to provide a copy of the vaccination record if obtained from local pharmacy or Health Dept. Verbalized acceptance and understanding.  Pneumococcal vaccine status: Up to date  Covid-19 vaccine status: Declined, Education has been provided regarding the importance of this vaccine but patient still declined. Advised may receive this vaccine at local pharmacy or Health Dept.or vaccine clinic. Aware to provide a copy of the vaccination record if obtained from local pharmacy  or Health Dept. Verbalized acceptance and understanding.  Qualifies for Shingles Vaccine? Yes   Zostavax completed Yes   Shingrix Completed?: Yes  Screening Tests Health Maintenance  Topic Date Due   Pneumonia Vaccine 90+ Years old (2 of 2 - PPSV23 or PCV20) 02/18/2021   Zoster Vaccines- Shingrix (2 of 2) 05/11/2021   COVID-19 Vaccine (1 - 2023-24 season) Never done   MAMMOGRAM  04/19/2022   INFLUENZA VACCINE  10/04/2022   Medicare Annual Wellness (AWV)  09/13/2023    DTaP/Tdap/Td (4 - Td or Tdap) 03/17/2031   Colonoscopy  11/30/2031   DEXA SCAN  Completed   Hepatitis C Screening  Completed   HPV VACCINES  Aged Out    Health Maintenance  Health Maintenance Due  Topic Date Due   Pneumonia Vaccine 71+ Years old (2 of 2 - PPSV23 or PCV20) 02/18/2021   Zoster Vaccines- Shingrix (2 of 2) 05/11/2021   COVID-19 Vaccine (1 - 2023-24 season) Never done   MAMMOGRAM  04/19/2022    Colorectal cancer screening: Type of screening: Colonoscopy. Completed 11/29/2021. Repeat every 10 years  Mammogram status: Ordered scheduled 11/2022. Pt provided with contact info and advised to call to schedule appt.   Bone Density status: Completed 04/13/2021. Results reflect: Bone density results: OSTEOPENIA. Repeat every 5 years.  Lung Cancer Screening: (Low Dose CT Chest recommended if Age 74-80 years, 20 pack-year currently smoking OR have quit w/in 15years.) does not qualify.   Lung Cancer Screening Referral: n/a  Additional Screening:  Hepatitis C Screening: does not qualify; Completed 09/21/2020  Vision Screening: Recommended annual ophthalmology exams for early detection of glaucoma and other disorders of the eye. Is the patient up to date with their annual eye exam?  Yes  Who is the provider or what is the name of the office in which the patient attends annual eye exams? Dr.Johnson  If pt is not established with a provider, would they like to be referred to a provider to establish care? No .   Dental Screening: Recommended annual dental exams for proper oral hygiene   Community Resource Referral / Chronic Care Management: CRR required this visit?  No   CCM required this visit?  No     Plan:     I have personally reviewed and noted the following in the patient's chart:   Medical and social history Use of alcohol, tobacco or illicit drugs  Current medications and supplements including opioid prescriptions. Patient is not currently taking opioid  prescriptions. Functional ability and status Nutritional status Physical activity Advanced directives List of other physicians Hospitalizations, surgeries, and ER visits in previous 12 months Vitals Screenings to include cognitive, depression, and falls Referrals and appointments  In addition, I have reviewed and discussed with patient certain preventive protocols, quality metrics, and best practice recommendations. A written personalized care plan for preventive services as well as general preventive health recommendations were provided to patient.     Lorrene Reid, LPN   1/61/0960   After Visit Summary: (MyChart) Due to this being a telephonic visit, the after visit summary with patients personalized plan was offered to patient via MyChart   Nurse Notes: none

## 2022-09-14 DIAGNOSIS — M2042 Other hammer toe(s) (acquired), left foot: Secondary | ICD-10-CM | POA: Diagnosis not present

## 2022-09-24 ENCOUNTER — Ambulatory Visit: Payer: Medicare Other | Admitting: Family Medicine

## 2022-10-11 ENCOUNTER — Ambulatory Visit: Payer: Medicare Other | Admitting: Family Medicine

## 2022-11-09 ENCOUNTER — Other Ambulatory Visit: Payer: Self-pay | Admitting: Orthopedic Surgery

## 2022-11-09 DIAGNOSIS — M2042 Other hammer toe(s) (acquired), left foot: Secondary | ICD-10-CM | POA: Diagnosis not present

## 2022-11-09 DIAGNOSIS — M7989 Other specified soft tissue disorders: Secondary | ICD-10-CM | POA: Diagnosis not present

## 2022-11-09 DIAGNOSIS — M21612 Bunion of left foot: Secondary | ICD-10-CM | POA: Diagnosis not present

## 2022-11-09 DIAGNOSIS — Z4889 Encounter for other specified surgical aftercare: Secondary | ICD-10-CM | POA: Diagnosis not present

## 2022-11-20 DIAGNOSIS — Z1231 Encounter for screening mammogram for malignant neoplasm of breast: Secondary | ICD-10-CM | POA: Diagnosis not present

## 2022-11-20 LAB — HM MAMMOGRAPHY

## 2022-11-21 NOTE — Patient Instructions (Signed)

## 2022-11-26 ENCOUNTER — Ambulatory Visit: Payer: Medicare Other | Admitting: Family Medicine

## 2022-11-26 ENCOUNTER — Encounter: Payer: Self-pay | Admitting: Family Medicine

## 2022-11-26 VITALS — BP 148/81 | HR 94 | Temp 98.3°F | Ht 64.0 in | Wt 196.0 lb

## 2022-11-26 DIAGNOSIS — G609 Hereditary and idiopathic neuropathy, unspecified: Secondary | ICD-10-CM | POA: Diagnosis not present

## 2022-11-26 DIAGNOSIS — E781 Pure hyperglyceridemia: Secondary | ICD-10-CM | POA: Diagnosis not present

## 2022-11-26 DIAGNOSIS — E1140 Type 2 diabetes mellitus with diabetic neuropathy, unspecified: Secondary | ICD-10-CM | POA: Diagnosis not present

## 2022-11-26 DIAGNOSIS — F419 Anxiety disorder, unspecified: Secondary | ICD-10-CM | POA: Diagnosis not present

## 2022-11-26 DIAGNOSIS — F32A Depression, unspecified: Secondary | ICD-10-CM

## 2022-11-26 DIAGNOSIS — E119 Type 2 diabetes mellitus without complications: Secondary | ICD-10-CM | POA: Insufficient documentation

## 2022-11-26 DIAGNOSIS — K219 Gastro-esophageal reflux disease without esophagitis: Secondary | ICD-10-CM

## 2022-11-26 DIAGNOSIS — E1169 Type 2 diabetes mellitus with other specified complication: Secondary | ICD-10-CM | POA: Diagnosis not present

## 2022-11-26 LAB — LIPID PANEL

## 2022-11-26 MED ORDER — OMEPRAZOLE 20 MG PO CPDR: 20.0000 mg | DELAYED_RELEASE_CAPSULE | Freq: Every day | ORAL | 3 refills | Status: DC

## 2022-11-26 MED ORDER — DULOXETINE HCL 60 MG PO CPEP: 120.0000 mg | ORAL_CAPSULE | Freq: Every day | ORAL | 3 refills | Status: DC

## 2022-11-26 NOTE — Progress Notes (Signed)
BP (!) 148/81   Pulse 94   Temp 98.3 F (36.8 C)   Ht 5\' 4"  (1.626 m)   Wt 196 lb (88.9 kg)   SpO2 96%   BMI 33.64 kg/m    Subjective:   Patient ID: Andrea Pittman, female    DOB: 08/15/1955, 67 y.o.   MRN: [id number]  HPI: Andrea Pittman is a 67 y.o. female presenting on 11/26/2022 for Medical Management of Chronic Issues   HPI Anxiety depression and neuropathy Patient is coming in today for anxiety and depression neuropathy.  She is currently on Cymbalta for months and Lyrica from Dr. Allena Katz in neurology.  Patient takes both of these and says they do help but she still has a lot of issues with neuropathy.  She does follow-up with neurology as well and is going to talk with them about other options and then come back to [place] next time.  Hyperlipidemia and hypertriglyceridemia Patient is coming in for recheck of his hyperlipidemia. The patient is currently taking no medicine currently, trying diet but not doing very good at diet. They deny any issues with myalgias or history of liver damage from it. They deny any focal numbness or weakness or chest pain.   GERD Patient is currently on omeprazole.  She denies any major symptoms or abdominal pain or belching or burping. She denies any blood in her stool or lightheadedness or dizziness.   Type 2 diabetes mellitus, new Patient comes in today for recheck of his diabetes. Patient has been currently taking no medicine currently, new diagnosis, A1c was 6.9 last time. Patient is not currently on an ACE inhibitor/ARB. Patient has not seen an ophthalmologist this year. Patient denies any new issues with their feet. The symptom started onset as an adult hypertriglyceridemia and GERD and neuropathy ARE RELATED TO DM.  She admits she is not really doing good diet at all  Relevant past medical, surgical, family and social history reviewed and updated as indicated. Interim medical history since our last visit reviewed. Allergies and medications reviewed  and updated.  Review of Systems  Constitutional:  Negative for chills and fever.  HENT:  Negative for congestion, ear discharge and ear pain.   Eyes:  Negative for redness and visual disturbance.  Respiratory:  Negative for chest tightness and shortness of breath.   Cardiovascular:  Negative for chest pain and leg swelling.  Genitourinary:  Negative for difficulty urinating and dysuria.  Musculoskeletal:  Negative for back pain and gait problem.  Skin:  Negative for rash.  Neurological:  Negative for light-headedness and headaches.  Psychiatric/Behavioral:  Negative for agitation and behavioral problems.   All other systems reviewed and are negative.   Per HPI unless specifically indicated above   Allergies as of 11/26/2022   No Known Allergies      Medication List        Accurate as of November 26, 2022  4:28 PM. If you have any questions, ask your nurse or doctor.          acetaminophen 500 MG tablet Commonly known as: TYLENOL Take 2 tablets (1,000 mg total) by mouth every 6 (six) hours as needed.   DULoxetine 60 MG capsule Commonly known as: CYMBALTA Take 2 capsules (120 mg total) by mouth daily.   estradiol 2 MG tablet Commonly known as: ESTRACE Take 1 mg by mouth daily.   ibuprofen 200 MG tablet Commonly known as: ADVIL Take 400-600 mg by mouth every 6 (six) hours as  needed for fever, headache or mild pain.   omeprazole 20 MG capsule Commonly known as: PRILOSEC Take 1 capsule (20 mg total) by mouth daily.   pregabalin 100 MG capsule Commonly known as: LYRICA Take 1 capsule (100 mg total) by mouth 2 (two) times daily.   Vitamin D 50 MCG (2000 UT) Caps Take by mouth.         Objective:   BP (!) 148/81   Pulse 94   Temp 98.3 F (36.8 C)   Ht 5\' 4"  (1.626 m)   Wt 196 lb (88.9 kg)   SpO2 96%   BMI 33.64 kg/m   Wt Readings from Last 3 Encounters:  11/26/22 196 lb (88.9 kg)  09/13/22 195 lb (88.5 kg)  06/19/22 196 lb (88.9 kg)     Physical Exam Vitals and nursing note reviewed.  Constitutional:      General: She is not in acute distress.    Appearance: She is well-developed. She is not diaphoretic.  Eyes:     Conjunctiva/sclera: Conjunctivae normal.  Cardiovascular:     Rate and Rhythm: Normal rate and regular rhythm.     Heart sounds: Normal heart sounds. No murmur heard. Pulmonary:     Effort: Pulmonary effort is normal. No respiratory distress.     Breath sounds: Normal breath sounds. No wheezing.  Musculoskeletal:        General: No swelling or tenderness. Normal range of motion.  Skin:    General: Skin is warm and dry.     Findings: No rash.  Neurological:     Mental Status: She is alert and oriented to person, place, and time.     Coordination: Coordination normal.  Psychiatric:        Behavior: Behavior normal.       Assessment & Plan:   Problem List Items Addressed This Visit       Digestive   GERD (gastroesophageal reflux disease)   Relevant Medications   omeprazole (PRILOSEC) 20 MG capsule   Other Relevant Orders   CBC with Differential/Platelet     Endocrine   Diabetes mellitus (HCC)   Relevant Orders   Bayer DCA Hb A1c Waived   CMP14+EGFR   Lipid panel   TSH     Nervous and Auditory   Hereditary and idiopathic peripheral neuropathy   Relevant Medications   DULoxetine (CYMBALTA) 60 MG capsule     Other   Anxiety and depression - Primary   Relevant Medications   DULoxetine (CYMBALTA) 60 MG capsule   Other Relevant Orders   CBC with Differential/Platelet   TSH   Hypertriglyceridemia   Relevant Orders   CBC with Differential/Platelet   Lipid panel   TSH    Will check A1c and blood work today.  Continue Cymbalta.  Continue to see neurologist but if they do want [place] to take over on the Lyrica we can in the future.  Blood pressure up slightly but we will continue to monitor and allowing permissive hypertension. Follow up plan: Return in about 3 months (around  02/25/2023), or if symptoms worsen or fail to improve, for Diabetes and anxiety and neuropathy and hypertriglyceridemia.  Counseling provided for all of the vaccine components Orders Placed This Encounter  Procedures   Bayer DCA Hb A1c Waived   CBC with Differential/Platelet   CMP14+EGFR   Lipid panel   TSH    Arville Care, MD [facility] 11/26/2022, 4:28 PM

## 2022-11-27 ENCOUNTER — Ambulatory Visit
Admission: RE | Admit: 2022-11-27 | Discharge: 2022-11-27 | Disposition: A | Discharge status: Home / Self Care | Payer: Medicare Other | Source: Ambulatory Visit | Attending: Orthopedic Surgery | Admitting: Orthopedic Surgery

## 2022-11-27 DIAGNOSIS — M19072 Primary osteoarthritis, left ankle and foot: Secondary | ICD-10-CM | POA: Diagnosis not present

## 2022-11-27 DIAGNOSIS — M7989 Other specified soft tissue disorders: Secondary | ICD-10-CM

## 2022-11-27 DIAGNOSIS — M7732 Calcaneal spur, left foot: Secondary | ICD-10-CM | POA: Diagnosis not present

## 2022-11-27 DIAGNOSIS — M79672 Pain in left foot: Secondary | ICD-10-CM | POA: Diagnosis not present

## 2022-11-27 LAB — CMP14+EGFR
ALT: 45 IU/L — ABNORMAL HIGH (ref 0–32)
AST: 75 IU/L — ABNORMAL HIGH (ref 0–40)
Albumin: 3.6 g/dL — ABNORMAL LOW (ref 3.9–4.9)
Alkaline Phosphatase: 99 IU/L (ref 44–121)
BUN/Creatinine Ratio: 15 (ref 12–28)
BUN: 12 mg/dL (ref 8–27)
Bilirubin Total: 0.3 mg/dL (ref 0.0–1.2)
CO2: 26 mmol/L (ref 20–29)
Calcium: 9.5 mg/dL (ref 8.7–10.3)
Chloride: 100 mmol/L (ref 96–106)
Creatinine, Ser: 0.8 mg/dL (ref 0.57–1.00)
Globulin, Total: 3.5 g/dL (ref 1.5–4.5)
Glucose: 246 mg/dL — ABNORMAL HIGH (ref 70–99)
Potassium: 4.4 mmol/L (ref 3.5–5.2)
Sodium: 138 mmol/L (ref 134–144)
Total Protein: 7.1 g/dL (ref 6.0–8.5)
eGFR: 81 mL/min/{1.73_m2} (ref >59)

## 2022-11-27 LAB — CBC WITH DIFFERENTIAL/PLATELET
Basophils Absolute: 0.1 10*3/uL (ref 0.0–0.2)
Basos: 1 %
EOS (ABSOLUTE): 0.5 10*3/uL — ABNORMAL HIGH (ref 0.0–0.4)
Eos: 5 %
Hematocrit: 41.3 % (ref 34.0–46.6)
Hemoglobin: 13.4 g/dL (ref 11.1–15.9)
Immature Grans (Abs): 0 10*3/uL (ref 0.0–0.1)
Immature Granulocytes: 0 %
Lymphocytes Absolute: 3.5 10*3/uL — ABNORMAL HIGH (ref 0.7–3.1)
Lymphs: 36 %
MCH: 30.2 pg (ref 26.6–33.0)
MCHC: 32.4 g/dL (ref 31.5–35.7)
MCV: 93 fL (ref 79–97)
Monocytes Absolute: 0.8 10*3/uL (ref 0.1–0.9)
Monocytes: 8 %
Neutrophils Absolute: 4.9 10*3/uL (ref 1.4–7.0)
Neutrophils: 50 %
Platelets: 306 10*3/uL (ref 150–450)
RBC: 4.43 x10E6/uL (ref 3.77–5.28)
RDW: 12.3 % (ref 11.7–15.4)
WBC: 9.8 10*3/uL (ref 3.4–10.8)

## 2022-11-27 LAB — LIPID PANEL
Cholesterol, Total: 169 mg/dL (ref 100–199)
HDL: 35 mg/dL — ABNORMAL LOW (ref >39)
LDL CALC COMMENT:: 4.8 ratio — ABNORMAL HIGH (ref 0.0–4.4)
LDL Chol Calc (NIH): 85 mg/dL (ref 0–99)
Triglycerides: 294 mg/dL — ABNORMAL HIGH (ref 0–149)
VLDL Cholesterol Cal: 49 mg/dL — ABNORMAL HIGH (ref 5–40)

## 2022-11-27 LAB — BAYER DCA HB A1C WAIVED: HB A1C (BAYER DCA - WAIVED): 7.3 % — ABNORMAL HIGH (ref 4.8–5.6)

## 2022-11-27 LAB — TSH: TSH: 0.99 u[IU]/mL (ref 0.450–4.500)

## 2022-12-06 ENCOUNTER — Other Ambulatory Visit: Payer: Self-pay

## 2022-12-06 DIAGNOSIS — E1169 Type 2 diabetes mellitus with other specified complication: Secondary | ICD-10-CM

## 2022-12-06 DIAGNOSIS — R7989 Other specified abnormal findings of blood chemistry: Secondary | ICD-10-CM

## 2022-12-12 DIAGNOSIS — Z4889 Encounter for other specified surgical aftercare: Secondary | ICD-10-CM | POA: Diagnosis not present

## 2022-12-12 DIAGNOSIS — M7989 Other specified soft tissue disorders: Secondary | ICD-10-CM | POA: Diagnosis not present

## 2022-12-12 DIAGNOSIS — M2042 Other hammer toe(s) (acquired), left foot: Secondary | ICD-10-CM | POA: Diagnosis not present

## 2022-12-12 DIAGNOSIS — M21612 Bunion of left foot: Secondary | ICD-10-CM | POA: Diagnosis not present

## 2022-12-26 LAB — HM DIABETES EYE EXAM

## 2022-12-27 ENCOUNTER — Encounter: Payer: Self-pay | Admitting: Family Medicine

## 2023-01-28 ENCOUNTER — Encounter: Payer: Self-pay | Admitting: Family Medicine

## 2023-02-12 ENCOUNTER — Encounter: Payer: Self-pay | Admitting: Family Medicine

## 2023-02-25 ENCOUNTER — Other Ambulatory Visit: Payer: Medicare Other

## 2023-02-25 DIAGNOSIS — E1169 Type 2 diabetes mellitus with other specified complication: Secondary | ICD-10-CM | POA: Diagnosis not present

## 2023-02-25 DIAGNOSIS — R7989 Other specified abnormal findings of blood chemistry: Secondary | ICD-10-CM | POA: Diagnosis not present

## 2023-02-25 LAB — BAYER DCA HB A1C WAIVED: HB A1C (BAYER DCA - WAIVED): 6.4 % — ABNORMAL HIGH (ref 4.8–5.6)

## 2023-02-26 LAB — HEPATIC FUNCTION PANEL
ALT: 33 [IU]/L — ABNORMAL HIGH (ref 0–32)
AST: 31 [IU]/L (ref 0–40)
Albumin: 3.9 g/dL (ref 3.9–4.9)
Alkaline Phosphatase: 100 [IU]/L (ref 44–121)
Bilirubin Total: 0.4 mg/dL (ref 0.0–1.2)
Bilirubin, Direct: 0.14 mg/dL (ref 0.00–0.40)
Total Protein: 7 g/dL (ref 6.0–8.5)

## 2023-03-07 ENCOUNTER — Ambulatory Visit: Payer: Medicare Other | Admitting: Family Medicine

## 2023-03-07 ENCOUNTER — Encounter: Payer: Self-pay | Admitting: Family Medicine

## 2023-03-07 VITALS — BP 131/83 | HR 94 | Ht 64.0 in | Wt 192.0 lb

## 2023-03-07 DIAGNOSIS — F419 Anxiety disorder, unspecified: Secondary | ICD-10-CM

## 2023-03-07 DIAGNOSIS — G609 Hereditary and idiopathic neuropathy, unspecified: Secondary | ICD-10-CM | POA: Diagnosis not present

## 2023-03-07 DIAGNOSIS — E1169 Type 2 diabetes mellitus with other specified complication: Secondary | ICD-10-CM | POA: Diagnosis not present

## 2023-03-07 DIAGNOSIS — E781 Pure hyperglyceridemia: Secondary | ICD-10-CM

## 2023-03-07 DIAGNOSIS — F32A Depression, unspecified: Secondary | ICD-10-CM

## 2023-03-07 NOTE — Progress Notes (Signed)
 BP 131/83   Pulse 94   Ht 5' 4 (1.626 m)   Wt 192 lb (87.1 kg)   SpO2 95%   BMI 32.96 kg/m    Subjective:   Patient ID: Andrea Pittman, female    DOB: 14-Oct-1955, 68 y.o.   MRN: [id number]  HPI: Andrea Pittman is a 68 y.o. female presenting on 03/07/2023 for Medical Management of Chronic Issues   HPI Type 2 diabetes mellitus Patient comes in today for recheck of his diabetes. Patient has been currently taking no medicine, diet control. Patient is not currently on an ACE inhibitor/ARB. Patient has not seen an ophthalmologist this year. Patient still has significant neuropathy but no change in her feet but she is starting to get some in her hands.. The symptom started onset as an adult peripheral neuropathy ARE RELATED TO DM   Hypertriglyceridemia Patient is coming in for recheck of his hypertriglyceridemia. The patient is currently taking no medicine currently, trying diet. They deny any issues with myalgias or history of liver damage from it. They deny any focal numbness or weakness or chest pain.   Anxiety and depression recheck Patient is coming in today for anxiety depression recheck and neuropathy.  Currently takes duloxetine  for months and gets Lyrica  from Dr. Tobie.  She feels like she is doing okay on the medicine, the only real issue she has is that the neuropathy continues to worsen, she does see neurology for this.    03/07/2023   10:33 AM 11/26/2022    4:12 PM 11/26/2022    3:56 PM 09/13/2022    2:20 PM 12/20/2021    9:56 AM  Depression screen PHQ 2/9  Decreased Interest 2 2 0 0 1  Down, Depressed, Hopeless 1 1 0 0 1  PHQ - 2 Score 3 3 0 0 2  Altered sleeping 2 3 0  2  Tired, decreased energy 3 3 0  2  Change in appetite 0 0 0  0  Feeling bad or failure about yourself  0 0 0  0  Trouble concentrating 1 1 0  1  Moving slowly or fidgety/restless 0 0 0  0  Suicidal thoughts 0 0 0  0  PHQ-9 Score 9 10 0  7  Difficult doing work/chores Somewhat difficult Somewhat difficult  Not difficult at all  Somewhat difficult    Patient is recently recovering from flulike illness but she feels like she is getting better, she still has a little bit of sore throat and some congestion but her fever chills and bodyaches and all that is gone.  Relevant past medical, surgical, family and social history reviewed and updated as indicated. Interim medical history since our last visit reviewed. Allergies and medications reviewed and updated.  Review of Systems  Constitutional:  Negative for chills and fever.  HENT:  Positive for congestion, postnasal drip, rhinorrhea and voice change. Negative for ear discharge, ear pain, sinus pressure, sneezing and sore throat.   Eyes:  Negative for pain, redness and visual disturbance.  Respiratory:  Positive for cough. Negative for chest tightness, shortness of breath and wheezing.   Cardiovascular:  Negative for chest pain and leg swelling.  Genitourinary:  Negative for difficulty urinating and dysuria.  Musculoskeletal:  Negative for back pain and gait problem.  Skin:  Negative for rash.  Neurological:  Negative for light-headedness and headaches.  Psychiatric/Behavioral:  Negative for agitation and behavioral problems.   All other systems reviewed and are negative.   Per  HPI unless specifically indicated above   Allergies as of 03/07/2023   No Known Allergies      Medication List        Accurate as of March 07, 2023 10:47 AM. If you have any questions, ask your nurse or doctor.          acetaminophen  500 MG tablet Commonly known as: TYLENOL  Take 2 tablets (1,000 mg total) by mouth every 6 (six) hours as needed.   DULoxetine  60 MG capsule Commonly known as: CYMBALTA  Take 2 capsules (120 mg total) by mouth daily.   estradiol  2 MG tablet Commonly known as: ESTRACE  Take 1 mg by mouth daily.   ibuprofen 200 MG tablet Commonly known as: ADVIL Take 400-600 mg by mouth every 6 (six) hours as needed for fever, headache or  mild pain.   omeprazole  20 MG capsule Commonly known as: PRILOSEC Take 1 capsule (20 mg total) by mouth daily.   pregabalin  100 MG capsule Commonly known as: LYRICA  Take 1 capsule (100 mg total) by mouth 2 (two) times daily.   Vitamin D 50 MCG (2000 UT) Caps Take by mouth.         Objective:   BP 131/83   Pulse 94   Ht 5' 4 (1.626 m)   Wt 192 lb (87.1 kg)   SpO2 95%   BMI 32.96 kg/m   Wt Readings from Last 3 Encounters:  03/07/23 192 lb (87.1 kg)  11/26/22 196 lb (88.9 kg)  09/13/22 195 lb (88.5 kg)    Physical Exam Vitals and nursing note reviewed.  Constitutional:      General: She is not in acute distress.    Appearance: She is well-developed. She is not diaphoretic.  Eyes:     Conjunctiva/sclera: Conjunctivae normal.  Cardiovascular:     Rate and Rhythm: Normal rate and regular rhythm.     Heart sounds: Normal heart sounds. No murmur heard. Pulmonary:     Effort: Pulmonary effort is normal. No respiratory distress.     Breath sounds: Normal breath sounds. No wheezing.  Musculoskeletal:        General: No swelling or tenderness. Normal range of motion.  Skin:    General: Skin is warm and dry.     Findings: No rash.  Neurological:     Mental Status: She is alert and oriented to person, place, and time.     Coordination: Coordination normal.  Psychiatric:        Behavior: Behavior normal.     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  Continue with current treatment, blood pressure and  everything else looks good.  She will continue to work with neurology for her neuropathy at this point. Follow up plan: Return in about 3 months (around 06/05/2023), or if symptoms worsen or fail to improve, for Diabetes and hypertriglyceridemia and neuropathy recheck.  Counseling provided for all of the vaccine components Orders Placed This Encounter  Procedures   Microalbumin / creatinine urine ratio    Fonda Levins, MD [facility] Family Medicine 03/07/2023, 10:47 AM

## 2023-03-12 ENCOUNTER — Emergency Department (HOSPITAL_BASED_OUTPATIENT_CLINIC_OR_DEPARTMENT_OTHER): Payer: Medicare Other | Admitting: Radiology

## 2023-03-12 ENCOUNTER — Encounter (HOSPITAL_BASED_OUTPATIENT_CLINIC_OR_DEPARTMENT_OTHER): Payer: Self-pay

## 2023-03-12 ENCOUNTER — Emergency Department (HOSPITAL_BASED_OUTPATIENT_CLINIC_OR_DEPARTMENT_OTHER)
Admission: EM | Admit: 2023-03-12 | Discharge: 2023-03-13 | Disposition: A | Discharge status: Home / Self Care | Payer: Medicare Other | Attending: Emergency Medicine | Admitting: Emergency Medicine

## 2023-03-12 ENCOUNTER — Other Ambulatory Visit: Payer: Self-pay

## 2023-03-12 DIAGNOSIS — J101 Influenza due to other identified influenza virus with other respiratory manifestations: Secondary | ICD-10-CM | POA: Insufficient documentation

## 2023-03-12 DIAGNOSIS — B349 Viral infection, unspecified: Secondary | ICD-10-CM | POA: Diagnosis not present

## 2023-03-12 DIAGNOSIS — E119 Type 2 diabetes mellitus without complications: Secondary | ICD-10-CM | POA: Insufficient documentation

## 2023-03-12 DIAGNOSIS — Z20822 Contact with and (suspected) exposure to covid-19: Secondary | ICD-10-CM | POA: Diagnosis not present

## 2023-03-12 DIAGNOSIS — R0602 Shortness of breath: Secondary | ICD-10-CM | POA: Diagnosis not present

## 2023-03-12 DIAGNOSIS — R918 Other nonspecific abnormal finding of lung field: Secondary | ICD-10-CM | POA: Diagnosis not present

## 2023-03-12 LAB — CBC WITH DIFFERENTIAL/PLATELET
Abs Immature Granulocytes: 0.07 K/uL (ref 0.00–0.07)
Basophils Absolute: 0 K/uL (ref 0.0–0.1)
Basophils Relative: 0 %
Eosinophils Absolute: 0.2 K/uL (ref 0.0–0.5)
Eosinophils Relative: 2 %
HCT: 43.5 % (ref 36.0–46.0)
Hemoglobin: 14.5 g/dL (ref 12.0–15.0)
Immature Granulocytes: 1 %
Lymphocytes Relative: 39 %
Lymphs Abs: 3.8 K/uL (ref 0.7–4.0)
MCH: 29.4 pg (ref 26.0–34.0)
MCHC: 33.3 g/dL (ref 30.0–36.0)
MCV: 88.2 fL (ref 80.0–100.0)
Monocytes Absolute: 0.9 K/uL (ref 0.1–1.0)
Monocytes Relative: 10 %
Neutro Abs: 4.6 K/uL (ref 1.7–7.7)
Neutrophils Relative %: 48 %
Platelets: 448 K/uL — ABNORMAL HIGH (ref 150–400)
RBC: 4.93 MIL/uL (ref 3.87–5.11)
RDW: 13.3 % (ref 11.5–15.5)
WBC: 9.6 K/uL (ref 4.0–10.5)
nRBC: 0 % (ref 0.0–0.2)

## 2023-03-12 LAB — BASIC METABOLIC PANEL WITH GFR
Anion gap: 12 (ref 5–15)
BUN: 9 mg/dL (ref 8–23)
CO2: 25 mmol/L (ref 22–32)
Calcium: 8.8 mg/dL — ABNORMAL LOW (ref 8.9–10.3)
Chloride: 101 mmol/L (ref 98–111)
Creatinine, Ser: 0.65 mg/dL (ref 0.44–1.00)
GFR, Estimated: >60 mL/min
Glucose, Bld: 127 mg/dL — ABNORMAL HIGH (ref 70–99)
Potassium: 3.3 mmol/L — ABNORMAL LOW (ref 3.5–5.1)
Sodium: 138 mmol/L (ref 135–145)

## 2023-03-12 LAB — RESP PANEL BY RT-PCR (RSV, FLU A&B, COVID)  RVPGX2
Influenza A by PCR: POSITIVE — AB
Influenza B by PCR: NEGATIVE
Resp Syncytial Virus by PCR: NEGATIVE
SARS Coronavirus 2 by RT PCR: NEGATIVE

## 2023-03-12 MED ORDER — ALBUTEROL SULFATE HFA 108 (90 BASE) MCG/ACT IN AERS: 2.0000 | INHALATION_SPRAY | RESPIRATORY_TRACT | Status: DC | PRN

## 2023-03-12 NOTE — ED Triage Notes (Signed)
 Pt reports she is here today due to sob. Pt reports she was dx with the flu last Monday. Pt reports since then her s/s started to get worse. Pt reports N&V, sob. Pt reports she feels weak and unable to keep anything down.

## 2023-03-13 ENCOUNTER — Encounter: Payer: Self-pay | Admitting: Family Medicine

## 2023-03-13 MED ORDER — ONDANSETRON 8 MG PO TBDP: ORAL_TABLET | ORAL | 0 refills | Status: DC

## 2023-03-13 MED ORDER — KETOROLAC TROMETHAMINE 30 MG/ML IJ SOLN
30.0000 mg | Freq: Once | INTRAMUSCULAR | Status: AC
  Administered 2023-03-13: 30 mg via INTRAVENOUS
  Filled 2023-03-13: qty 1

## 2023-03-13 MED ORDER — SODIUM CHLORIDE 0.9 % IV BOLUS
1000.0000 mL | Freq: Once | INTRAVENOUS | Status: AC
  Administered 2023-03-13: 1000 mL via INTRAVENOUS

## 2023-03-13 MED ORDER — ONDANSETRON HCL 4 MG/2ML IJ SOLN
4.0000 mg | Freq: Once | INTRAMUSCULAR | Status: AC
  Administered 2023-03-13: 4 mg via INTRAVENOUS
  Filled 2023-03-13: qty 2

## 2023-03-13 NOTE — ED Provider Notes (Signed)
 [place] EMERGENCY DEPARTMENT AT [facility] Provider Note   CSN: [id number] Arrival date & time: 03/12/23  1859     History  Chief Complaint  Patient presents with   Shortness of Breath    Andrea Pittman is a 68 y.o. female.  Patient is a 68 year old female with history of GERD, type 2 diabetes.  Patient presenting today for evaluation of cough, congestion, body aches, loose stools, and feeling generally unwell.  The symptoms have been persistent for the past 8 days.  She tells me her husband had the flu around Christmas time, then she became sick shortly thereafter.  She describes constant diarrhea and feels as though she is dehydrated.  She describes dry mouth and everything she eats or drinks running straight through her.  She denies abdominal pain.  The history is provided by the patient.       Home Medications Prior to Admission medications   Medication Sig Start Date End Date Taking? Authorizing Provider  acetaminophen  (TYLENOL ) 500 MG tablet Take 2 tablets (1,000 mg total) by mouth every 6 (six) hours as needed. 09/23/20   Tammy Sor, PA-C  Cholecalciferol (VITAMIN D) 50 MCG (2000 UT) CAPS Take by mouth.    [provider]  DULoxetine  (CYMBALTA ) 60 MG capsule Take 2 capsules (120 mg total) by mouth daily. 11/26/22   Dettinger, Fonda LABOR, MD  estradiol  (ESTRACE ) 2 MG tablet Take 1 mg by mouth daily. 08/03/20   [provider]  ibuprofen (ADVIL) 200 MG tablet Take 400-600 mg by mouth every 6 (six) hours as needed for fever, headache or mild pain.    [provider]  omeprazole  (PRILOSEC) 20 MG capsule Take 1 capsule (20 mg total) by mouth daily. 11/26/22   Dettinger, Fonda LABOR, MD  pregabalin  (LYRICA ) 100 MG capsule Take 1 capsule (100 mg total) by mouth 2 (two) times daily. 06/19/22   Patel, Donika K, DO      Allergies    Patient has no known allergies.    Review of Systems   Review of Systems  All other systems reviewed and are  negative.   Physical Exam Updated Vital Signs BP (!) 155/84   Pulse 84   Temp 98.1 F (36.7 C) (Oral)   Resp 18   SpO2 94%  Physical Exam Vitals and nursing note reviewed.  Constitutional:      General: She is not in acute distress.    Appearance: She is well-developed. She is not diaphoretic.  HENT:     Head: Normocephalic and atraumatic.  Cardiovascular:     Rate and Rhythm: Normal rate and regular rhythm.     Heart sounds: No murmur heard.    No friction rub. No gallop.  Pulmonary:     Effort: Pulmonary effort is normal. No respiratory distress.     Breath sounds: Normal breath sounds. No wheezing.  Abdominal:     General: Bowel sounds are normal. There is no distension.     Palpations: Abdomen is soft.     Tenderness: There is no abdominal tenderness.  Musculoskeletal:        General: Normal range of motion.     Cervical back: Normal range of motion and neck supple.  Skin:    General: Skin is warm and dry.  Neurological:     General: No focal deficit present.     Mental Status: She is alert and oriented to person, place, and time.     ED Results / Procedures /  Treatments   Labs (all labs ordered are listed, but only abnormal results are displayed) Labs Reviewed  RESP PANEL BY RT-PCR (RSV, FLU A&B, COVID)  RVPGX2 - Abnormal; Notable for the following components:      Result Value   Influenza A by PCR POSITIVE (*)    All other components within normal limits  CBC WITH DIFFERENTIAL/PLATELET - Abnormal; Notable for the following components:   Platelets 448 (*)    All other components within normal limits  BASIC METABOLIC PANEL - Abnormal; Notable for the following components:   Potassium 3.3 (*)    Glucose, Bld 127 (*)    Calcium 8.8 (*)    All other components within normal limits    EKG EKG Interpretation Date/Time:  Tuesday March 12 2023 19:50:00 EST Ventricular Rate:  87 PR Interval:  148 QRS Duration:  94 QT Interval:  360 QTC  Calculation: 433 R Axis:   93  Text Interpretation: Normal sinus rhythm Rightward axis Cannot rule out Anterior infarct (cited on or before 26-Aug-2020) Abnormal ECG When compared with ECG of 26-Aug-2020 17:18, Questionable change in QRS axis Confirmed by Ula Barter [phone]) on 03/12/2023 7:53:31 PM  Radiology DG Chest 2 View Result Date: 03/12/2023 CLINICAL DATA:  Shortness of breath EXAM: CHEST - 2 VIEW COMPARISON:  08/26/2020 FINDINGS: Hazy opacity in the right lower lung laterally suspicious for pneumonia. The left lung is clear. No pleural effusion or pneumothorax. Normal cardiomediastinal silhouette. IMPRESSION: Hazy opacity in the right lower lung laterally suspicious for pneumonia. Follow-up in 6-8 weeks after treatment is recommended to ensure resolution. Electronically Signed   By: Norman Gatlin M.D.   On: 03/12/2023 20:06    Procedures Procedures    Medications Ordered in ED Medications  albuterol  (VENTOLIN  HFA) 108 (90 Base) MCG/ACT inhaler 2 puff (has no administration in time range)  sodium chloride  0.9 % bolus 1,000 mL (has no administration in time range)  ondansetron  (ZOFRAN ) injection 4 mg (has no administration in time range)  ketorolac  (TORADOL ) 30 MG/ML injection 30 mg (has no administration in time range)    ED Course/ Medical Decision Making/ A&P  Patient is a 68 year old female with history of GERD and type 2 diabetes.  Patient presenting for evaluation of cough, congestion, diarrhea, and bodyaches.  Patient arrives here with stable vital signs and is afebrile.  Physical examination basically unremarkable.  Laboratory studies obtained including CBC and basic metabolic panel.  There is no leukocytosis or electrolyte derangement.  Respiratory panel positive for influenza A, but negative for COVID and RSV.  Chest x-ray shows no acute process.  Patient hydrated with normal saline and given Toradol  for discomfort and Zofran  for nausea.  She seems to be feeling better.   At this point, I feel as though patient can safely be discharged.  This appears to be a viral process and she is out of the window for Tamiflu .  Patient to take over-the-counter medicines, take plenty of fluids, and follow-up as needed.  Final Clinical Impression(s) / ED Diagnoses Final diagnoses:  None    Rx / DC Orders ED Discharge Orders     None         [name], MD 03/13/23 [phone]

## 2023-03-13 NOTE — Discharge Instructions (Signed)
 Drink plenty of fluids and get plenty of rest.  Take Zofran  as prescribed as needed for nausea.  Take Tylenol  1000 mg rotated with ibuprofen 600 mg every 4 hours as needed for fever or pain.  Take over-the-counter medications as needed for relief of symptoms.  Return to the ER if your symptoms significantly worsen or change.

## 2023-03-22 ENCOUNTER — Telehealth: Payer: Self-pay

## 2023-03-22 NOTE — Progress Notes (Signed)
Transition Care Management Unsuccessful Follow-up Telephone Call  Date of discharge and from where:  [facility] 1/8  Attempts:  1st Attempt  Reason for unsuccessful TCM follow-up call:  No answer/busy   Lenard Forth [facility] Guide, Phone: [phone] Fax: [phone] Website: [email or web address]

## 2023-03-25 ENCOUNTER — Telehealth: Payer: Self-pay

## 2023-03-25 NOTE — Progress Notes (Signed)
Transition Care Management Unsuccessful Follow-up Telephone Call  Date of discharge and from where:  [facility]  Attempts:  2nd Attempt  Reason for unsuccessful TCM follow-up call:  No answer/busy   Lenard Forth [facility] Guide, Phone: [phone] Fax: [phone] Website: [email or web address]

## 2023-04-24 ENCOUNTER — Other Ambulatory Visit: Payer: Self-pay | Admitting: Neurology

## 2023-05-06 ENCOUNTER — Encounter: Payer: Self-pay | Admitting: Neurology

## 2023-06-07 ENCOUNTER — Other Ambulatory Visit

## 2023-06-07 ENCOUNTER — Ambulatory Visit: Payer: Medicare Other | Admitting: Family Medicine

## 2023-06-17 IMAGING — MR MR ABDOMEN WO/W CM MRCP
19 of 21 series · 46 of 48 positions shown · IV contrast (gadavist)
Comparison: Right upper quadrant ultrasound dated 09/19/2020. CTA
abdomen/pelvis dated 08/26/2020.

CLINICAL DATA: Epigastric abdominal pain

EXAM:
MRI ABDOMEN WITHOUT AND WITH CONTRAST (INCLUDING MRCP)
TECHNIQUE: Multiplanar multisequence MR imaging of the abdomen was performed
both before and after the administration of intravenous contrast.
Heavily T2-weighted images of the biliary and pancreatic ducts were
obtained, and three-dimensional MRCP images were rendered by post
processing.
CONTRAST:  8.5mL GADAVIST GADOBUTROL 1 MMOL/ML IV SOLN

[Series 4: cor haste · coronal · 6.0mm · 1.25mm/px · 1 of 35 slices shown]
[im 1/35]
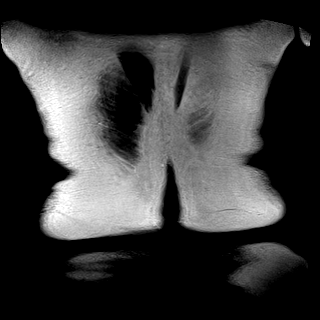

[Series 5: ax haste · axial · 6.0mm · 1.25mm/px · 1 of 35 slices shown]
[im 1/35]
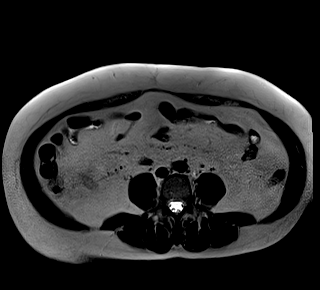

[Series 6: T2 fat-sat · axial · 6.0mm · 1.56mm/px · 1 of 36 slices shown]
[im 1/36]
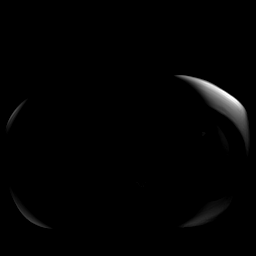

[Series 11: DWI · axial · 6.0mm · 1.37mm/px · z∈[-265,-13]mm · 4 of 108 slices shown (1 of 2)]
[im 1/108]
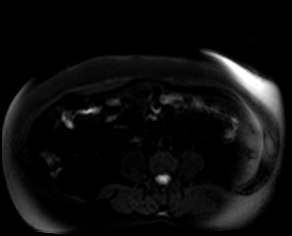
[im 36/108]
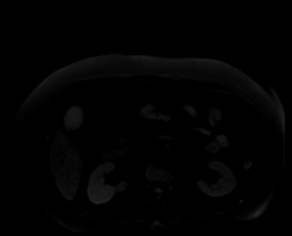
[im 72/108]
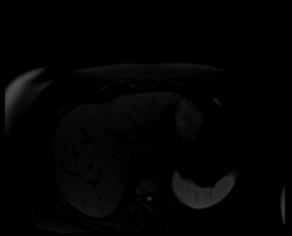
[im 108/108]
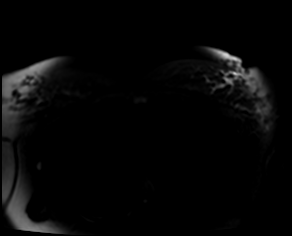

[Series 12: DWI · axial · 6.0mm · 1.37mm/px · 1 of 36 slices shown (2 of 2)]
[im 1/36]
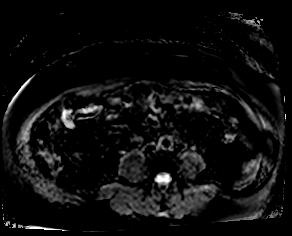

[Series 13: ax in and · axial · 3.0mm · 1.25mm/px · z∈[-281,-20]mm · 3 of 88 slices shown (1 of 2)]
[im 1/88]
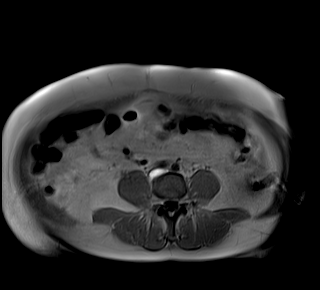
[im 44/88]
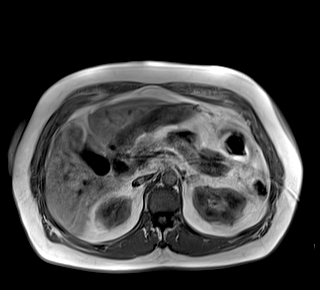
[im 88/88]
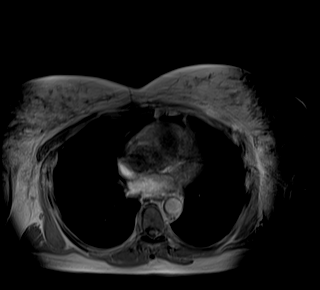

[Series 13: ax in and · axial · 3.0mm · 1.25mm/px · z∈[-281,-20]mm · 3 of 88 slices shown (2 of 2)]
[im 1/88]
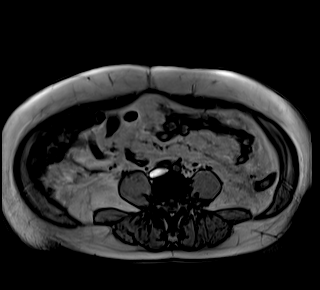
[im 44/88]
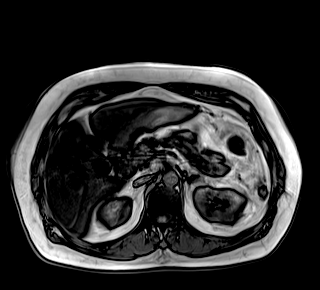
[im 88/88]
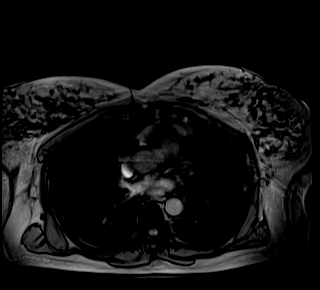

[Series 14: MRCP · coronal · 4.0mm · 1.12mm/px · 1 of 15 slices shown]
[im 1/15]
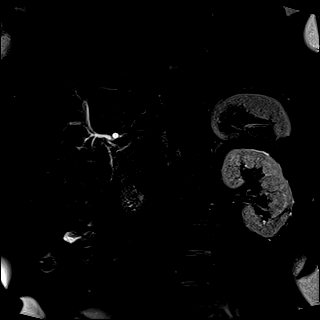

[Series 15: radials · coronal · 50.0mm · 0.78mm/px · 1 of 5 slices shown]
[im 1/5]
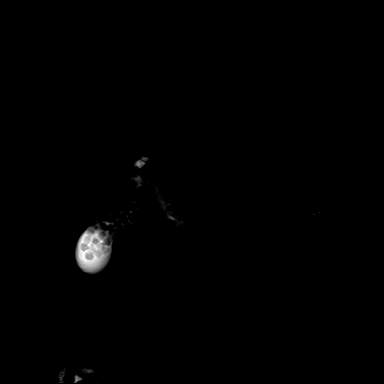

[Series 16: T1 dynamic · axial · non-contrast · 3.0mm · 1.25mm/px · z∈[-281,-20]mm · 3 of 88 slices shown (1 of 5)]
[im 1/88]
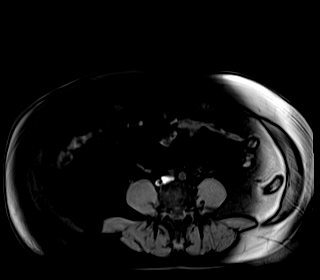
[im 44/88]
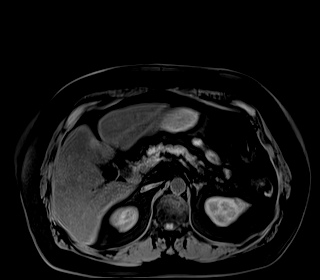
[im 88/88]
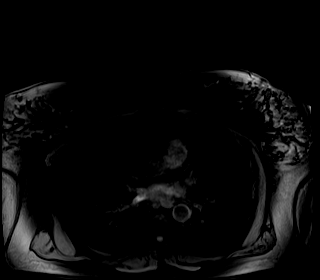

[Series 19: T1 dynamic post-contrast · axial · 3.0mm · 1.25mm/px · z∈[-281,-20]mm · 3 of 88 slices shown (1 of 5)]
[im 1/88]
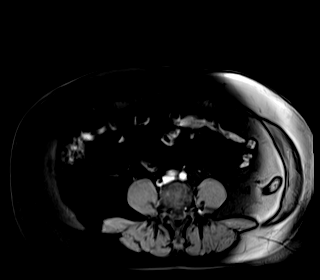
[im 44/88]
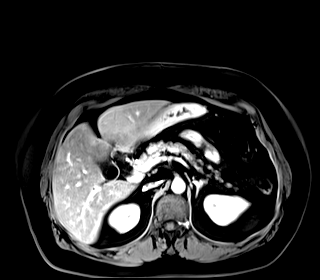
[im 88/88]
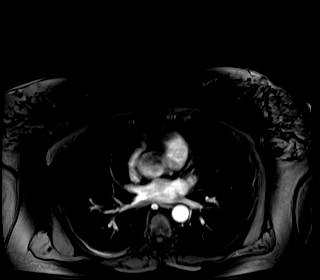

[Series 20: T1 dynamic · axial · 3.0mm · 1.25mm/px · z∈[-281,-20]mm · 3 of 88 slices shown (2 of 5)]
[im 1/88]
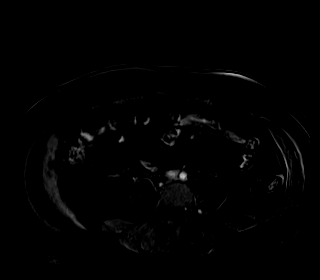
[im 44/88]
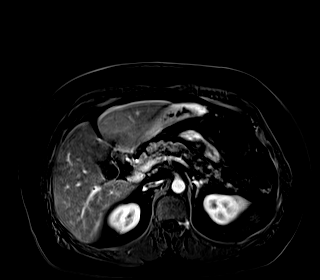
[im 88/88]
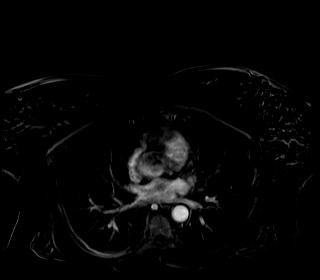

[Series 21: T1 dynamic post-contrast · axial · 3.0mm · 1.25mm/px · z∈[-281,-20]mm · 3 of 88 slices shown (2 of 5)]
[im 1/88]
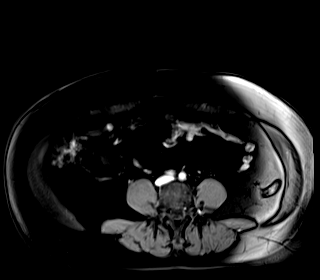
[im 44/88]
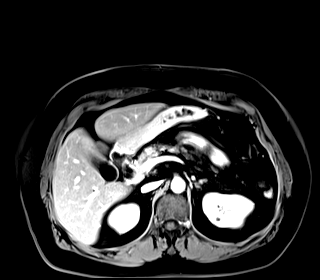
[im 88/88]
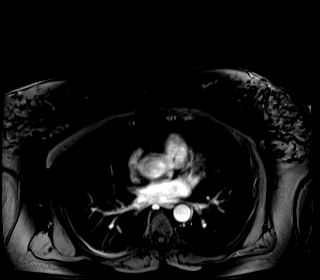

[Series 22: T1 dynamic · axial · 3.0mm · 1.25mm/px · z∈[-281,-20]mm · 3 of 88 slices shown (3 of 5)]
[im 1/88]
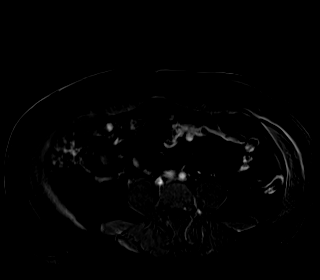
[im 44/88]
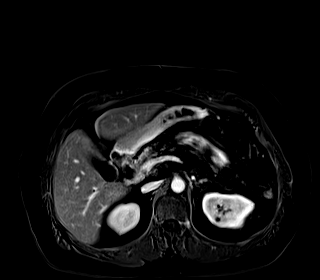
[im 88/88]
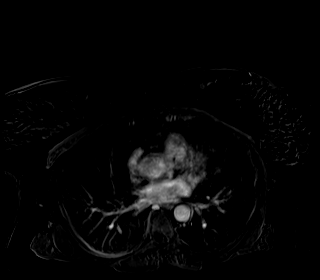

[Series 23: T1 dynamic post-contrast · axial · 3.0mm · 1.25mm/px · z∈[-281,-20]mm · 3 of 88 slices shown (3 of 5)]
[im 1/88]
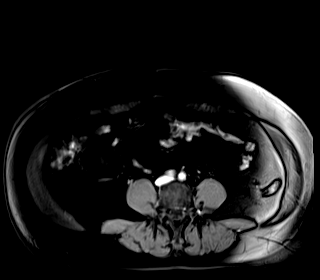
[im 44/88]
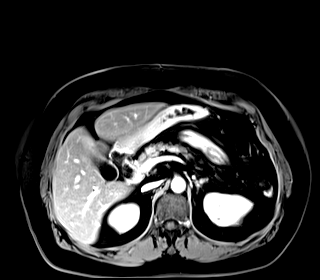
[im 88/88]
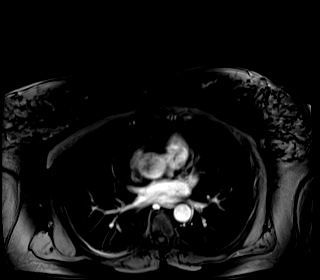

[Series 24: T1 dynamic · axial · 3.0mm · 1.25mm/px · z∈[-281,-20]mm · 3 of 88 slices shown (4 of 5)]
[im 1/88]
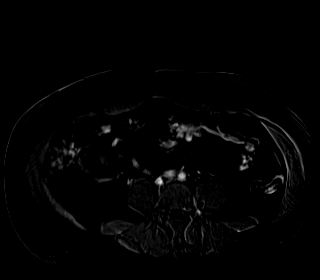
[im 44/88]
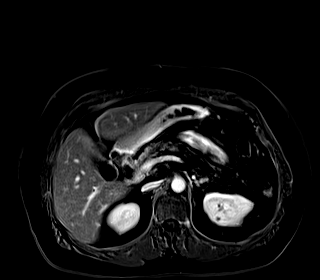
[im 88/88]
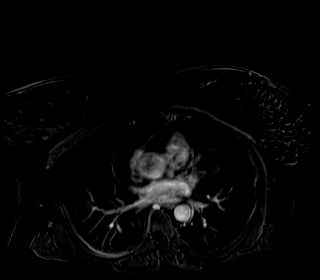

[Series 25: T1 dynamic post-contrast · axial · 3.0mm · 1.25mm/px · z∈[-281,-20]mm · 3 of 88 slices shown (4 of 5)]
[im 1/88]
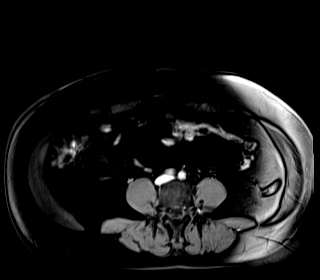
[im 44/88]
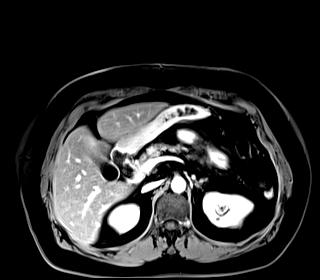
[im 88/88]
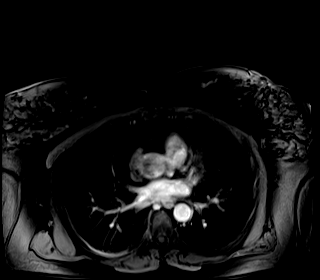

[Series 26: T1 dynamic · axial · 3.0mm · 1.25mm/px · z∈[-281,-20]mm · 3 of 88 slices shown (5 of 5)]
[im 1/88]
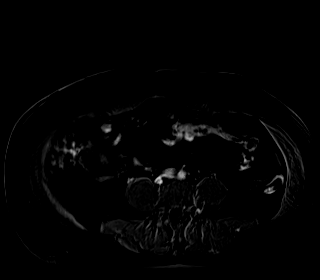
[im 44/88]
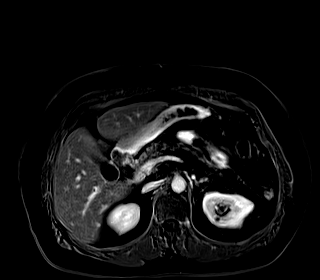
[im 88/88]
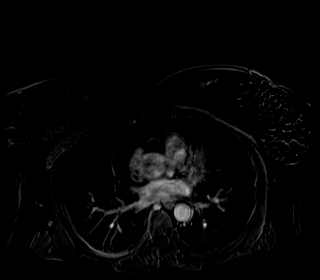

[Series 27: T1 dynamic post-contrast · coronal · 3.0mm · 1.25mm/px · 3 of 80 slices shown (5 of 5)]
[im 1/80]
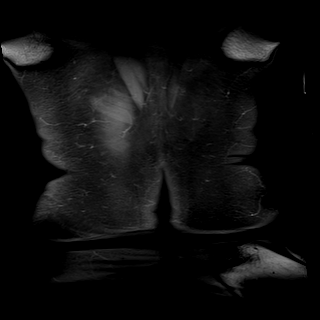
[im 40/80]
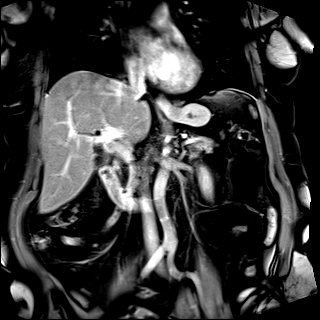
[im 80/80]
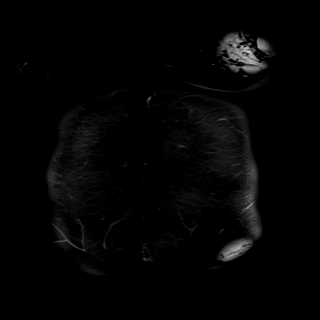

[46 of 48 positions shown; findings below may reference images not displayed]

FINDINGS: Lower chest: Lung bases are clear.

Hepatobiliary: Severe hepatic steatosis. No focal hepatic lesion is
seen.

Numerous gallstones (series 6/image 20), without associated
inflammatory changes.

No intrahepatic ductal dilatation. Common duct measures 11 mm
(series 4/image 18). Two distal CBD stones measuring up to 5 mm
(series 4/image 15).

Pancreas:  Within normal limits.

Spleen:  Within normal limits.

Adrenals/Urinary Tract:  Adrenal glands are within normal limits.

Subcentimeter upper pole renal cysts.  No hydronephrosis.

Stomach/Bowel: Stomach is within normal limits.

Visualized bowel is unremarkable.

Mild left colonic diverticulosis, without evidence of
diverticulitis.

Vascular/Lymphatic:  No evidence of abdominal aortic aneurysm.

No suspicious abdominal lymphadenopathy.

Other:  No abdominal ascites.

Musculoskeletal: No focal osseous lesions.
IMPRESSION: Choledocholithiasis with two distal CBD stones measuring up to 5 mm.
Common duct measures 11 mm. ERCP is suggested.

Numerous gallstones, without associated inflammatory changes to
suggest acute cholecystitis.

Severe hepatic steatosis.

## 2023-06-18 ENCOUNTER — Ambulatory Visit: Admitting: Neurology

## 2023-06-18 ENCOUNTER — Encounter: Payer: Self-pay | Admitting: Neurology

## 2023-06-20 ENCOUNTER — Other Ambulatory Visit: Payer: Self-pay | Admitting: Neurology

## 2023-06-24 ENCOUNTER — Ambulatory Visit: Payer: Medicare Other | Admitting: Neurology

## 2023-07-15 ENCOUNTER — Encounter: Payer: Self-pay | Admitting: Family Medicine

## 2023-07-15 ENCOUNTER — Ambulatory Visit (INDEPENDENT_AMBULATORY_CARE_PROVIDER_SITE_OTHER): Admitting: Family Medicine

## 2023-07-15 VITALS — BP 120/71 | HR 88 | Temp 98.0°F | Ht 64.0 in | Wt 195.4 lb

## 2023-07-15 DIAGNOSIS — E1169 Type 2 diabetes mellitus with other specified complication: Secondary | ICD-10-CM | POA: Insufficient documentation

## 2023-07-15 DIAGNOSIS — F419 Anxiety disorder, unspecified: Secondary | ICD-10-CM

## 2023-07-15 DIAGNOSIS — E781 Pure hyperglyceridemia: Secondary | ICD-10-CM

## 2023-07-15 DIAGNOSIS — R5383 Other fatigue: Secondary | ICD-10-CM | POA: Diagnosis not present

## 2023-07-15 DIAGNOSIS — F32A Depression, unspecified: Secondary | ICD-10-CM | POA: Diagnosis not present

## 2023-07-15 LAB — BAYER DCA HB A1C WAIVED: HB A1C (BAYER DCA - WAIVED): 7.5 % — ABNORMAL HIGH (ref 4.8–5.6)

## 2023-07-15 NOTE — Progress Notes (Signed)
 BP 120/71   Pulse 88   Temp 98 F (36.7 C)   Ht 5\' 4"  (1.626 m)   Wt 195 lb 6.4 oz (88.6 kg)   SpO2 95%   BMI 33.54 kg/m    Subjective:   Patient ID: Andrea Pittman, female    DOB: February 17, 1956, 68 y.o.   MRN: [id number]  HPI: Andrea Pittman is a 68 y.o. female presenting on 07/15/2023 for Medical Management of Chronic Issues (Patient would like to have thyroid  checked. )   HPI Type 2 diabetes mellitus Patient comes in today for recheck of his diabetes. Patient has been currently taking no medication currently, has been diet controlled mostly up to this point.. Patient is not currently on an ACE inhibitor/ARB. Patient has not seen an ophthalmologist this year. Patient denies any new issues with their feet. The symptom started onset as an adult hypertriglycerider ARE RELATED TO DM   Hypertriglyceridemia Patient is coming in for recheck of his hypertriglyceridemia. The patient is currently taking no medication. They deny any issues with myalgias or history of liver damage from it. They deny any focal numbness or weakness or chest pain.   Anxiety and depression Patient is coming in today for anxiety depression recheck.  She is fatigued but otherwise doing.    03/07/2023   10:33 AM 11/26/2022    4:12 PM 11/26/2022    3:56 PM 09/13/2022    2:20 PM 12/20/2021    9:56 AM  Depression screen PHQ 2/9  Decreased Interest 2 2 0 0 1  Down, Depressed, Hopeless 1 1 0 0 1  PHQ - 2 Score 3 3 0 0 2  Altered sleeping 2 3 0  2  Tired, decreased energy 3 3 0  2  Change in appetite 0 0 0  0  Feeling bad or failure about yourself  0 0 0  0  Trouble concentrating 1 1 0  1  Moving slowly or fidgety/restless 0 0 0  0  Suicidal thoughts 0 0 0  0  PHQ-9 Score 9 10 0  7  Difficult doing work/chores Somewhat difficult Somewhat difficult Not difficult at all  Somewhat difficult     Relevant past medical, surgical, family and social history reviewed and updated as indicated. Interim medical history since our  last visit reviewed. Allergies and medications reviewed and updated.  Review of Systems  Constitutional:  Positive for fatigue. Negative for chills and fever.  HENT:  Negative for congestion, ear discharge and ear pain.   Eyes:  Negative for redness and visual disturbance.  Respiratory:  Negative for chest tightness and shortness of breath.   Cardiovascular:  Negative for chest pain and leg swelling.  Genitourinary:  Negative for difficulty urinating and dysuria.  Musculoskeletal:  Negative for back pain and gait problem.  Skin:  Negative for rash.  Neurological:  Negative for dizziness, light-headedness and headaches.  Psychiatric/Behavioral:  Negative for agitation and behavioral problems.   All other systems reviewed and are negative.   Per HPI unless specifically indicated above   Allergies as of 07/15/2023   No Known Allergies      Medication List        Accurate as of Jul 15, 2023  3:06 PM. If you have any questions, ask your nurse or doctor.          acetaminophen  500 MG tablet Commonly known as: TYLENOL  Take 2 tablets (1,000 mg total) by mouth every 6 (six) hours as needed.  DULoxetine  60 MG capsule Commonly known as: CYMBALTA  Take 2 capsules (120 mg total) by mouth daily.   estradiol  2 MG tablet Commonly known as: ESTRACE  Take 1 mg by mouth daily.   ibuprofen 200 MG tablet Commonly known as: ADVIL Take 400-600 mg by mouth every 6 (six) hours as needed for fever, headache or mild pain.   omeprazole  20 MG capsule Commonly known as: PRILOSEC Take 1 capsule (20 mg total) by mouth daily.   ondansetron  8 MG disintegrating tablet Commonly known as: ZOFRAN -ODT 8mg  ODT q4 hours prn nausea   pregabalin  100 MG capsule Commonly known as: LYRICA  TAKE ONE (1) CAPSULE BY MOUTH 2 TIMES DAILY   Vitamin D 50 MCG (2000 UT) Caps Take by mouth.         Objective:   BP 120/71   Pulse 88   Temp 98 F (36.7 C)   Ht 5\' 4"  (1.626 m)   Wt 195 lb 6.4 oz  (88.6 kg)   SpO2 95%   BMI 33.54 kg/m   Wt Readings from Last 3 Encounters:  07/15/23 195 lb 6.4 oz (88.6 kg)  03/07/23 192 lb (87.1 kg)  11/26/22 196 lb (88.9 kg)    Physical Exam Vitals and nursing note reviewed.  Constitutional:      General: She is not in acute distress.    Appearance: She is well-developed. She is not diaphoretic.  Eyes:     Conjunctiva/sclera: Conjunctivae normal.  Cardiovascular:     Rate and Rhythm: Normal rate and regular rhythm.     Heart sounds: Normal heart sounds. No murmur heard. Pulmonary:     Effort: Pulmonary effort is normal. No respiratory distress.     Breath sounds: Normal breath sounds. No wheezing.  Musculoskeletal:        General: No swelling. Normal range of motion.  Skin:    General: Skin is warm and dry.     Findings: No rash.  Neurological:     Mental Status: She is alert and oriented to person, place, and time.     Coordination: Coordination normal.  Psychiatric:        Behavior: Behavior normal.       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  Blood pressure looks good today, heart rate and everything else looked good today.  Will add on a thyroid  just because of her decreased energy. Follow up plan: Return in about 3 months (around 10/15/2023), or if symptoms worsen or fail to improve, for Diabetes and anxiety depression and hypertriglyceridemia.  Counseling provided for  all of the vaccine components Orders Placed This Encounter  Procedures   Bayer DCA Hb A1c Waived   CMP14+EGFR   CBC with Differential/Platelet   Lipid panel   Microalbumin / creatinine urine ratio   TSH  [name], MD [facility] 07/15/2023, 3:06 PM

## 2023-07-16 LAB — CMP14+EGFR
ALT: 44 IU/L — ABNORMAL HIGH (ref 0–32)
AST: 56 IU/L — ABNORMAL HIGH (ref 0–40)
Albumin: 3.8 g/dL — ABNORMAL LOW (ref 3.9–4.9)
Alkaline Phosphatase: 105 IU/L (ref 44–121)
BUN/Creatinine Ratio: 16 (ref 12–28)
BUN: 12 mg/dL (ref 8–27)
Bilirubin Total: 0.4 mg/dL (ref 0.0–1.2)
CO2: 24 mmol/L (ref 20–29)
Calcium: 9.4 mg/dL (ref 8.7–10.3)
Chloride: 100 mmol/L (ref 96–106)
Creatinine, Ser: 0.75 mg/dL (ref 0.57–1.00)
Globulin, Total: 3.6 g/dL (ref 1.5–4.5)
Glucose: 116 mg/dL — ABNORMAL HIGH (ref 70–99)
Potassium: 4.2 mmol/L (ref 3.5–5.2)
Sodium: 141 mmol/L (ref 134–144)
Total Protein: 7.4 g/dL (ref 6.0–8.5)
eGFR: 87 mL/min/{1.73_m2} (ref >59)

## 2023-07-16 LAB — CBC WITH DIFFERENTIAL/PLATELET
Basophils Absolute: 0.1 10*3/uL (ref 0.0–0.2)
Basos: 1 %
EOS (ABSOLUTE): 0.4 10*3/uL (ref 0.0–0.4)
Eos: 5 %
Hematocrit: 44.9 % (ref 34.0–46.6)
Hemoglobin: 14.5 g/dL (ref 11.1–15.9)
Immature Grans (Abs): 0.1 10*3/uL (ref 0.0–0.1)
Immature Granulocytes: 1 %
Lymphocytes Absolute: 3.6 10*3/uL — ABNORMAL HIGH (ref 0.7–3.1)
Lymphs: 37 %
MCH: 29.8 pg (ref 26.6–33.0)
MCHC: 32.3 g/dL (ref 31.5–35.7)
MCV: 92 fL (ref 79–97)
Monocytes Absolute: 0.9 10*3/uL (ref 0.1–0.9)
Monocytes: 9 %
Neutrophils Absolute: 4.8 10*3/uL (ref 1.4–7.0)
Neutrophils: 47 %
Platelets: 385 10*3/uL (ref 150–450)
RBC: 4.87 x10E6/uL (ref 3.77–5.28)
RDW: 12.8 % (ref 11.7–15.4)
WBC: 9.9 10*3/uL (ref 3.4–10.8)

## 2023-07-16 LAB — LIPID PANEL
Chol/HDL Ratio: 5.3 ratio — ABNORMAL HIGH (ref 0.0–4.4)
Cholesterol, Total: 187 mg/dL (ref 100–199)
HDL: 35 mg/dL — ABNORMAL LOW (ref >39)
LDL Chol Calc (NIH): 93 mg/dL (ref 0–99)
Triglycerides: 356 mg/dL — ABNORMAL HIGH (ref 0–149)
VLDL Cholesterol Cal: 59 mg/dL — ABNORMAL HIGH (ref 5–40)

## 2023-07-16 LAB — MICROALBUMIN / CREATININE URINE RATIO
Creatinine, Urine: 52.8 mg/dL
Microalb/Creat Ratio: 90 mg/g{creat} — ABNORMAL HIGH (ref 0–29)
Microalbumin, Urine: 47.6 ug/mL

## 2023-07-17 LAB — SPECIMEN STATUS REPORT

## 2023-07-17 LAB — TSH: TSH: 0.846 u[IU]/mL (ref 0.450–4.500)

## 2023-07-24 ENCOUNTER — Ambulatory Visit: Payer: Self-pay | Admitting: Family Medicine

## 2023-08-15 ENCOUNTER — Other Ambulatory Visit: Payer: Self-pay | Admitting: Family Medicine

## 2023-08-15 ENCOUNTER — Other Ambulatory Visit: Payer: Self-pay | Admitting: Neurology

## 2023-08-15 DIAGNOSIS — K219 Gastro-esophageal reflux disease without esophagitis: Secondary | ICD-10-CM

## 2023-09-10 ENCOUNTER — Ambulatory Visit: Admitting: Neurology

## 2023-09-10 VITALS — BP 123/66 | HR 93 | Ht 64.0 in | Wt 196.4 lb

## 2023-09-10 DIAGNOSIS — G609 Hereditary and idiopathic neuropathy, unspecified: Secondary | ICD-10-CM | POA: Diagnosis not present

## 2023-09-10 MED ORDER — PREGABALIN 100 MG PO CAPS: 100.0000 mg | ORAL_CAPSULE | Freq: Two times a day (BID) | ORAL | 5 refills | Status: AC

## 2023-09-10 NOTE — Patient Instructions (Signed)
Nerve testing of both legs.  ELECTROMYOGRAM AND NERVE CONDUCTION STUDIES (EMG/NCS) INSTRUCTIONS  How to Prepare The neurologist conducting the EMG will need to know if you have certain medical conditions. Tell the neurologist and other EMG lab personnel if you: . Have a pacemaker or any other electrical medical device . Take blood-thinning medications . Have hemophilia, a blood-clotting disorder that causes prolonged bleeding Bathing Take a shower or bath shortly before your exam in order to remove oils from your skin. Don't apply lotions or creams before the exam.  What to Expect You'll likely be asked to change into a hospital gown for the procedure and lie down on an examination table. The following explanations can help you understand what will happen during the exam.  . Electrodes. The neurologist or a technician places surface electrodes at various locations on your skin depending on where you're experiencing symptoms. Or the neurologist may insert needle electrodes at different sites depending on your symptoms.  . Sensations. The electrodes will at times transmit a tiny electrical current that you may feel as a twinge or spasm. The needle electrode may cause discomfort or pain that usually ends shortly after the needle is removed. If you are concerned about discomfort or pain, you may want to talk to the neurologist about taking a short break during the exam.  . Instructions. During the needle EMG, the neurologist will assess whether there is any spontaneous electrical activity when the muscle is at rest - activity that isn't present in healthy muscle tissue - and the degree of activity when you slightly contract the muscle.  He or she will give you instructions on resting and contracting a muscle at appropriate times. Depending on what muscles and nerves the neurologist is examining, he or she may ask you to change positions during the exam.  After your EMG You may experience some  temporary, minor bruising where the needle electrode was inserted into your muscle. This bruising should fade within several days. If it persists, contact your primary care doctor.    

## 2023-09-10 NOTE — Progress Notes (Signed)
 Follow-up Visit   Date: 09/10/23   Andrea Pittman MRN: [id number] DOB: Dec 23, 1955   Interim History: Andrea Pittman is a 68 y.o. right-handed Caucasian female with anxiety/depression, bilateral CTS release, and GERD returning to the clinic for follow-up of idiopathic neuropathy (2010).  The patient was accompanied to the clinic by self.   IMPRESSION/PLAN: Painful idiopathic peripheral neuropathy, stable  - Previously tried:  Gabapentin , nortriptyline, amitriptyline, and tramadol  - Continue Cymbalta  120mg  daily  - Continue Lyrica  100mg  twice daily  - NCS/EMG of the legs  Cervical spondylosis with moderate canal stenosis at at C5-6 and C6-7.   - Neck PT declined  Bilateral carpal tunnel syndrome s/p release with mild residual findings on EMG  Return to clinic in 1 year  History of present illness: She was diagnosed with neuropathy ~2010 by EMG by Dr. Lindy.  Symptoms starting with tingling in the soles and over time, involves the entire feet.  She has numbness over the toes and sensitivity over the top of the feet, which is worse on the left.  She has severe pain with light pressure. Pain is worse at night time and has become more constant over time.  Starting earlier this year, she began having burning pain in the hands.  Her water has been tested for heavy metals. No history of diabetes, alcohol abuse, or family history of neuropathy. Prior labs looking for causes of neuropathy were normal. She has also seen Dr. Maurice for second opinion who agreed with her diagnosis of idiopathic neuropathy.   UPDATE 05/02/2020:  She continues to have tingling over the feet.  At her last visit, I recommended tapering off gabapentin  and starting Lyrica .  She is currently on Lyrica  50mg  twice daily.  She denies any dizziness or sedation and is interested in increasing the dose.  She also has similar sensation in the hands which has been getting worse.  She also complains of throbbing pain around the  left great toe/bunion.   UPDATE 01/30/2021:  She is here for follow-up. She continues to have shooting pain in the fingers.  EMG shows mild CTS, no neuropathy. She endorse neck pain.  Her neuropathy in the feet is unchanged.  She went for a pedicure in September and a few days later, she noticed the her right great toe was blistered and bleeding at the tip of the toe. She went to podiatrist who gave instructions for care.  It has since healed.   UPDATE 06/19/2022:  She was last seen in 2022.  She is here for refills on Lyrica . Since this time, she reports ongoing shooting pain in the hands and feet.  She takes Lyrica  100mg  daily and 100mg  at bedtime as needed.  She is very sleepy when she takes it twice daily.  She also reports to gaining 30lb. However, she does endorse that Lyrica  helps control her pain, especially when taking it with Cymbalta  120mg /d.    Starting in 2024, she began having shooting pain over the left side of the foot and into the last few toes.  Pain can be severe when it occurs.  She denies low back pain or radicular leg pain.  She is scheduled to have bunion surgery and has pain at the base of her foot.  UPDATE 09/10/2023:  She is here for follow-up.  She continues to have numbness and tingling in the leg, which is worse on the left foot.  She gets occasional shooting pain in the left foot at the  ball with walking.  She endorses some imbalance, no falls, and she walks unassisted.  She was found to be prediabetic and is trying to control this with diet. Neck pain is intermittent. She denies radicular pain or hand weakness. She has difficulty with using her hands moreso from joint pain.     Medications:  Current Outpatient Medications on File Prior to Visit  Medication Sig Dispense Refill   acetaminophen  (TYLENOL ) 500 MG tablet Take 2 tablets (1,000 mg total) by mouth every 6 (six) hours as needed. 30 tablet 0   Cholecalciferol (VITAMIN D) 50 MCG (2000 UT) CAPS Take by mouth.      DULoxetine  (CYMBALTA ) 60 MG capsule Take 2 capsules (120 mg total) by mouth daily. 180 capsule 3   estradiol  (ESTRACE ) 2 MG tablet Take 1 mg by mouth daily.     ibuprofen (ADVIL) 200 MG tablet Take 400-600 mg by mouth every 6 (six) hours as needed for fever, headache or mild pain.     omeprazole  (PRILOSEC) 20 MG capsule TAKE ONE CAPSULE BY MOUTH DAILY 90 capsule 0   ondansetron  (ZOFRAN -ODT) 8 MG disintegrating tablet 8mg  ODT q4 hours prn nausea 10 tablet 0   No current facility-administered medications on file prior to visit.    Allergies: No Known Allergies  Vital Signs:  BP 123/66   Pulse 93   Ht 5' 4 (1.626 m)   Wt 196 lb 6.4 oz (89.1 kg)   SpO2 98%   BMI 33.71 kg/m     Neurological Exam: MENTAL STATUS including orientation to time, place, person, recent and remote memory, attention span and concentration, language, and fund of knowledge is normal.  Speech is not dysarthric.  CRANIAL NERVES:   Normal conjugate, extra-ocular eye movements in all directions of gaze.  No ptosis.    MOTOR:  Motor strength is 5/5 in all extremities, including distally in the feet.  No atrophy, fasciculations or abnormal movements.  No pronator drift.  Tone is normal.    MSRs:  Reflexes are 3+/4 throughout and 2+/4 at the ankles.  SENSORY:  Vibration is absent at the great toe bilaterally, reduced at the ankles.     COORDINATION/GAIT:   Gait appears mildly wide-based, stable, and unassisted.   Data:  NCS/EMG of the arms 06/14/2020: Bilateral median neuropathy at or distal to the wrist, consistent with a clinical diagnosis of carpal tunnel syndrome.  Overall, these findings are mild in degree electrically There is no evidence of a sensorimotor polyneuropathy affecting the upper extremities.  MRI cervical spine 03/08/2021: 1. Left paracentral disc protrusion at C5-6 with resultant mild flattening of the left hemi cord, but with no cord signal changes or significant spinal stenosis. 2. Left eccentric  disc osteophyte complex at C6-7 with resultant mild cord flattening and mild left greater than right C7 foraminal stenosis. 3. Mild to moderate multilevel facet hypertrophy as above. Finding could contribute to underlying neck pain.    Thank you for allowing me to participate in patient's care.  If I can answer any additional questions, I would be pleased to do so.    Sincerely,    Hope Brandenburger K. Tobie, DO

## 2023-09-11 DIAGNOSIS — L812 Freckles: Secondary | ICD-10-CM | POA: Diagnosis not present

## 2023-09-11 DIAGNOSIS — D485 Neoplasm of uncertain behavior of skin: Secondary | ICD-10-CM | POA: Diagnosis not present

## 2023-09-11 DIAGNOSIS — L820 Inflamed seborrheic keratosis: Secondary | ICD-10-CM | POA: Diagnosis not present

## 2023-09-11 DIAGNOSIS — L918 Other hypertrophic disorders of the skin: Secondary | ICD-10-CM | POA: Diagnosis not present

## 2023-09-11 DIAGNOSIS — D2271 Melanocytic nevi of right lower limb, including hip: Secondary | ICD-10-CM | POA: Diagnosis not present

## 2023-09-11 DIAGNOSIS — L821 Other seborrheic keratosis: Secondary | ICD-10-CM | POA: Diagnosis not present

## 2023-09-11 DIAGNOSIS — D1801 Hemangioma of skin and subcutaneous tissue: Secondary | ICD-10-CM | POA: Diagnosis not present

## 2023-09-16 ENCOUNTER — Ambulatory Visit: Payer: Medicare Other

## 2023-09-16 VITALS — BP 123/65 | HR 93 | Ht 64.0 in | Wt 196.0 lb

## 2023-09-16 DIAGNOSIS — Z0000 Encounter for general adult medical examination without abnormal findings: Secondary | ICD-10-CM

## 2023-09-16 NOTE — Progress Notes (Signed)
 Subjective:   Andrea Pittman is a 68 y.o. who presents for a Medicare Wellness preventive visit.  As a reminder, Annual Wellness Visits don't include a physical exam, and some assessments may be limited, especially if this visit is performed virtually. We may recommend an in-person follow-up visit with your provider if needed.  Visit Complete: Virtual I connected with  [name] on 09/16/23 by a audio enabled telemedicine application and verified that I am speaking with the correct person using two identifiers.  Patient Location: Home  Provider Location: Home Office  I discussed the limitations of evaluation and management by telemedicine. The patient expressed understanding and agreed to proceed.  Vital Signs: Because this visit was a virtual/telehealth visit, some criteria may be missing or patient reported. Any vitals not documented were not able to be obtained and vitals that have been documented are patient reported.  VideoDeclined- This patient declined Librarian, academic. Therefore the visit was completed with audio only.  Persons Participating in Visit: Patient.  AWV Questionnaire: No: Patient Medicare AWV questionnaire was not completed prior to this visit.  Cardiac Risk Factors include: advanced age (>13men, >37 women);diabetes mellitus;obesity (BMI >30kg/m2);dyslipidemia     Objective:    Today's Vitals   09/16/23 1253 09/16/23 1255  BP: 123/65   Pulse: 93   Weight: 196 lb (88.9 kg)   Height: 5' 4 (1.626 m)   PainSc:  7    Body mass index is 33.64 kg/m.     09/16/2023    1:01 PM 03/12/2023    7:43 PM 09/13/2022    2:21 PM 06/19/2022    9:27 AM 01/30/2021   11:09 AM 11/09/2020   11:37 AM 09/22/2020   10:01 AM  Advanced Directives  Does Patient Have a Medical Advance Directive? No No No No No No No  Would patient like information on creating a medical advance directive?  No - Guardian declined Yes (MAU/Ambulatory/Procedural Areas -  Information given)   No - Patient declined No - Patient declined    Current Medications (verified) Outpatient Encounter Medications as of 09/16/2023  Medication Sig   acetaminophen  (TYLENOL ) 500 MG tablet Take 2 tablets (1,000 mg total) by mouth every 6 (six) hours as needed.   Cholecalciferol (VITAMIN D) 50 MCG (2000 UT) CAPS Take by mouth.   DULoxetine  (CYMBALTA ) 60 MG capsule Take 2 capsules (120 mg total) by mouth daily.   estradiol  (ESTRACE ) 2 MG tablet Take 1 mg by mouth daily.   ibuprofen (ADVIL) 200 MG tablet Take 400-600 mg by mouth every 6 (six) hours as needed for fever, headache or mild pain.   omeprazole  (PRILOSEC) 20 MG capsule TAKE ONE CAPSULE BY MOUTH DAILY   ondansetron  (ZOFRAN -ODT) 8 MG disintegrating tablet 8mg  ODT q4 hours prn nausea   pregabalin  (LYRICA ) 100 MG capsule Take 1 capsule (100 mg total) by mouth 2 (two) times daily.   No facility-administered encounter medications on file as of 09/16/2023.    Allergies (verified) Patient has no known allergies.   History: Past Medical History:  Diagnosis Date   Anxiety    Depression    GERD (gastroesophageal reflux disease)    Past Surgical History:  Procedure Laterality Date   ABDOMINAL HYSTERECTOMY     BILATERAL CARPAL TUNNEL RELEASE     bladder tack     CHOLECYSTECTOMY N/A 09/23/2020   Procedure: LAPAROSCOPIC CHOLECYSTECTOMY;  Surgeon: Belinda Cough, MD;  Location: [facility] OR;  Service: General;  Laterality: N/A;   ERCP  N/A 09/22/2020   Procedure: ENDOSCOPIC RETROGRADE CHOLANGIOPANCREATOGRAPHY (ERCP);  Surgeon: [name], MD;  Location: [facility] ENDOSCOPY;  Service: Endoscopy;  Laterality: N/A;   REMOVAL OF STONES  09/22/2020   Procedure: REMOVAL OF STONES;  Surgeon: [name], MD;  Location: [facility] ENDOSCOPY;  Service: Endoscopy;;   SPHINCTEROTOMY  09/22/2020   Procedure: ANNETT;  Surgeon: [name], MD;  Location: [facility] ENDOSCOPY;  Service: Endoscopy;;   Family History  Problem Relation Age of Onset   Cancer  Mother    Social History   Socioeconomic History   Marital status: Married    Spouse name: Not on file   Number of children: Not on file   Years of education: Not on file   Highest education level: Not on file  Occupational History   Not on file  Tobacco Use   Smoking status: Never   Smokeless tobacco: Never  Vaping Use   Vaping status: Never Used  Substance and Sexual Activity   Alcohol use: No   Drug use: No   Sexual activity: Not on file  Other Topics Concern   Not on file  Social History Narrative   She does not work and watches her grandson.    Right handed    Lives in one story home with a basement.   Social Drivers of Corporate investment banker Strain: Low Risk  (09/16/2023)   Overall Financial Resource Strain (CARDIA)    Difficulty of Paying Living Expenses: Not hard at all  Food Insecurity: No Food Insecurity (09/16/2023)   Hunger Vital Sign    Worried About Running Out of Food in the Last Year: Never true    Ran Out of Food in the Last Year: Never true  Transportation Needs: No Transportation Needs (09/16/2023)   PRAPARE - Administrator, Civil Service (Medical): No    Lack of Transportation (Non-Medical): No  Physical Activity: Inactive (09/16/2023)   Exercise Vital Sign    Days of Exercise per Week: 0 days    Minutes of Exercise per Session: 0 min  Stress: No Stress Concern Present (09/16/2023)   [employer] of Occupational Health - Occupational Stress Questionnaire    Feeling of Stress: Not at all  Social Connections: Socially Integrated (09/16/2023)   Social Connection and Isolation Panel    Frequency of Communication with Friends and Family: More than three times a week    Frequency of Social Gatherings with Friends and Family: More than three times a week    Attends Religious Services: More than 4 times per year    Active Member of [employer] or Organizations: Yes    Attends Engineer, structural: More than 4 times per year     Marital Status: Married    Tobacco Counseling Counseling given: Yes    Clinical Intake:  Pre-visit preparation completed: Yes  Pain : 0-10 Pain Score: 7  Pain Type: Neuropathic pain Pain Location: Foot Pain Orientation: Right, Left Pain Radiating Towards: Rx pains Pain Descriptors / Indicators: Aching, Constant Pain Onset: Other (comment) Pain Frequency: Constant     BMI - recorded: 33.64 Nutritional Status: BMI > 30  Obese Nutritional Risks: None Diabetes: Yes CBG done?: No  Lab Results  Component Value Date   HGBA1C 7.5 (H) 07/15/2023   HGBA1C 6.4 (H) 02/25/2023   HGBA1C 7.3 (H) 11/26/2022     How often do you need to have someone help you when you read instructions, pamphlets, or other written materials from your doctor or  pharmacy?: 1 - Never  Interpreter Needed?: No  Information entered by :: alia t/cma   Activities of Daily Living     09/16/2023   12:58 PM  In your present state of health, do you have any difficulty performing the following activities:  Hearing? 0  Vision? 0  Comment pt goes to [facility] in [place], [place]/last ov less than a yr  Difficulty concentrating or making decisions? 0  Walking or climbing stairs? 0  Dressing or bathing? 0  Doing errands, shopping? 0  Preparing Food and eating ? N  Using the Toilet? N  In the past six months, have you accidently leaked urine? Y  Do you have problems with loss of bowel control? N  Managing your Medications? N  Managing your Finances? N  Housekeeping or managing your Housekeeping? N    Patient Care Team: Dettinger, Fonda LABOR, MD as PCP - General (Family Medicine) Patel, Donika K, DO as Consulting Physician (Neurology) [facility], [facility]  I have updated your Care Teams any recent Medical Services you may have received from other providers in the past year.     Assessment:   This is a routine wellness examination for Kitzia.  Hearing/Vision screen Hearing Screening -  Comments:: Pt denies hearing dif Vision Screening - Comments:: pt goes to [facility] in [place], /last ov less than a yr   Goals Addressed             This Visit's Progress    Patient Stated       Pt wants to finish remodeling her house       Depression Screen     09/16/2023    1:03 PM 03/07/2023   10:33 AM 11/26/2022    4:12 PM 11/26/2022    3:56 PM 09/13/2022    2:20 PM 12/20/2021    9:56 AM 12/20/2021    9:40 AM  PHQ 2/9 Scores  PHQ - 2 Score 1 3 3  0 0 2 0  PHQ- 9 Score 3 9 10  0  7     Fall Risk     09/16/2023   12:55 PM 03/07/2023   10:33 AM 11/26/2022    3:56 PM 09/13/2022    2:18 PM 06/19/2022    9:26 AM  Fall Risk   Falls in the past year? 0 0 0 0 1  Number falls in past yr: 0  0 0 1  Injury with Fall? 0  0 0 1  Risk for fall due to : No Fall Risks  No Fall Risks No Fall Risks   Follow up Falls evaluation completed  Education provided Falls prevention discussed Falls evaluation completed    MEDICARE RISK AT HOME:  Medicare Risk at Home Any stairs in or around the home?: Yes If so, are there any without handrails?: Yes Home free of loose throw rugs in walkways, pet beds, electrical cords, etc?: Yes Adequate lighting in your home to reduce risk of falls?: Yes Life alert?: No Use of a cane, walker or w/c?: No Grab bars in the bathroom?: No Shower chair or bench in shower?: Yes Elevated toilet seat or a handicapped toilet?: Yes  TIMED UP AND GO:  Was the test performed?  no  Cognitive Function: 6CIT completed        09/16/2023    1:06 PM 09/13/2022    2:21 PM 11/09/2020   11:39 AM  6CIT Screen  What Year? 0 points 0 points 0 points  What month? 0 points  0 points 0 points  What time? 0 points 0 points 0 points  Count back from 20 0 points 0 points 0 points  Months in reverse 0 points 0 points 0 points  Repeat phrase 0 points 0 points 0 points  Total Score 0 points 0 points 0 points    Immunizations Immunization History  Administered Date(s)  Administered   Pneumococcal Conjugate-13 02/19/2020   Td 07/10/2010   Tdap 07/10/2010, 03/16/2021   Zoster Recombinant(Shingrix) 03/16/2021    Screening Tests Health Maintenance  Topic Date Due   Zoster Vaccines- Shingrix (2 of 2) 05/11/2021   DEXA SCAN  04/14/2023   Pneumococcal Vaccine: 50+ Years (2 of 2 - PPSV23, PCV20, or PCV21) 11/26/2023 (Originally 04/15/2020)   COVID-19 Vaccine (1 - 2024-25 season) 10/01/2024 (Originally 11/04/2022)   INFLUENZA VACCINE  10/04/2023   MAMMOGRAM  11/20/2023   OPHTHALMOLOGY EXAM  12/26/2023   HEMOGLOBIN A1C  01/15/2024   FOOT EXAM  03/06/2024   Diabetic kidney evaluation - eGFR measurement  07/14/2024   Diabetic kidney evaluation - Urine ACR  07/14/2024   Medicare Annual Wellness (AWV)  09/15/2024   DTaP/Tdap/Td (4 - Td or Tdap) 03/17/2031   Colonoscopy  11/30/2031   Hepatitis C Screening  Completed   Hepatitis B Vaccines  Aged Out   HPV VACCINES  Aged Out   Meningococcal B Vaccine  Aged Out    Health Maintenance  Health Maintenance Due  Topic Date Due   Zoster Vaccines- Shingrix (2 of 2) 05/11/2021   DEXA SCAN  04/14/2023   Health Maintenance Items Addressed: See Nurse Notes at the end of this note  Additional Screening:  Vision Screening: Recommended annual ophthalmology exams for early detection of glaucoma and other disorders of the eye. Would you like a referral to an eye doctor? No    Dental Screening: Recommended annual dental exams for proper oral hygiene  Community Resource Referral / Chronic Care Management: CRR required this visit?  No   CCM required this visit?  No   Plan:    I have personally reviewed and noted the following in the patient's chart:   Medical and social history Use of alcohol, tobacco or illicit drugs  Current medications and supplements including opioid prescriptions. Patient is not currently taking opioid prescriptions. Functional ability and status Nutritional status Physical  activity Advanced directives List of other physicians Hospitalizations, surgeries, and ER visits in previous 12 months Vitals Screenings to include cognitive, depression, and falls Referrals and appointments  In addition, I have reviewed and discussed with patient certain preventive protocols, quality metrics, and best practice recommendations. A written personalized care plan for preventive services as well as general preventive health recommendations were provided to patient.   Ozie Ned, CMA   09/16/2023   After Visit Summary: (MyChart) Due to this being a telephonic visit, the after visit summary with patients personalized plan was offered to patient via MyChart   Notes: per awv pt is due her 2nd shingles/pneumonia vaccines. pt is aware of her Dexa, will make an appt soon.

## 2023-09-16 NOTE — Patient Instructions (Signed)
 Andrea Pittman , Thank you for taking time out of your busy schedule to complete your Annual Wellness Visit with me. I enjoyed our conversation and look forward to speaking with you again next year. I, as well as your care team,  appreciate your ongoing commitment to your health goals. Please review the following plan we discussed and let me know if I can assist you in the future. Your Game plan/ To Do List    Follow up Visits: Next Medicare AWV with our clinical staff: 09/16/24 at 1:50p.m.   Have you seen your provider in the last 6 months (3 months if uncontrolled diabetes)? Yes Next Office Visit with your provider: 10/17/23 at 10:40p.m.  Clinician Recommendations:  Aim for 30 minutes of exercise or brisk walking, 6-8 glasses of water, and 5 servings of fruits and vegetables each day.       This is a list of the screening recommended for you and due dates:  Health Maintenance  Topic Date Due   Zoster (Shingles) Vaccine (2 of 2) 05/11/2021   DEXA scan (bone density measurement)  04/14/2023   Medicare Annual Wellness Visit  09/13/2023   Pneumococcal Vaccine for age over 60 (2 of 2 - PPSV23, PCV20, or PCV21) 11/26/2023*   COVID-19 Vaccine (1 - 2024-25 season) 10/01/2024*   Flu Shot  10/04/2023   Mammogram  11/20/2023   Eye exam for diabetics  12/26/2023   Hemoglobin A1C  01/15/2024   Complete foot exam   03/06/2024   Yearly kidney function blood test for diabetes  07/14/2024   Yearly kidney health urinalysis for diabetes  07/14/2024   DTaP/Tdap/Td vaccine (4 - Td or Tdap) 03/17/2031   Colon Cancer Screening  11/30/2031   Hepatitis C Screening  Completed   Hepatitis B Vaccine  Aged Out   HPV Vaccine  Aged Out   Meningitis B Vaccine  Aged Out  *Topic was postponed. The date shown is not the original due date.    Advanced directives: (Declined) Advance directive discussed with you today. Even though you declined this today, please call our office should you change your mind, and we can give  you the proper paperwork for you to fill out. Advance Care Planning is important because it:  [x]  Makes sure you receive the medical care that is consistent with your values, goals, and preferences  [x]  It provides guidance to your family and loved ones and reduces their decisional burden about whether or not they are making the right decisions based on your wishes.  Follow the link provided in your after visit summary or read over the paperwork we have mailed to you to help you started getting your Advance Directives in place. If you need assistance in completing these, please reach out to us  so that we can help you!  See attachments for Preventive Care and Fall Prevention Tips.

## 2023-10-10 ENCOUNTER — Ambulatory Visit: Admitting: Neurology

## 2023-10-10 DIAGNOSIS — G609 Hereditary and idiopathic neuropathy, unspecified: Secondary | ICD-10-CM

## 2023-10-10 NOTE — Procedures (Signed)
 [facility]  [address] [place], [address]  [place], [place] [address] Tel: [phone] Fax: [phone] Test Date:  10/10/2023  Patient: Andrea Pittman DOB: 03-28-1955 Physician: Tonita Blanch, DO  Sex: Female Height: 5' 4 Ref Phys: Tonita Blanch, DO  ID#: [id number]   Technician:    History: This is a 68 year old female with history of neuropathy referred for evaluation of worsening bilateral leg pain.  NCV & EMG Findings: Extensive electrodiagnostic testing of the right lower extremity and additional studies of the left shows:  Bilateral sural and superficial peroneal sensory responses are absent. Bilateral peroneal and right tibial motor responses show reduced amplitude.  Left tibial motor response is absent.  Bilateral peroneal motor responses at the tibialis anterior are within normal limits. Bilateral tibial H reflex studies are prolonged. There is no evidence of active or chronic motor axonal loss changes affecting any of the tested muscles.  Motor unit configuration and recruitment pattern is within normal limits.  Impression: The electrophysiologic findings are consistent with a chronic sensorimotor axonal polyneuropathy affecting the lower extremities.   ___________________________ Tonita Blanch, DO    Nerve Conduction Studies   Stim Site NR Peak (ms) Norm Peak (ms) O-P Amp (V) Norm O-P Amp  Left Sup Peroneal Anti Sensory (Ant Lat Mall)  32 C  12 cm *NR  <4.6  >3  Right Sup Peroneal Anti Sensory (Ant Lat Mall)  32 C  12 cm *NR  <4.6  >3  Left Sural Anti Sensory (Lat Mall)  32 C  Calf *NR  <4.6  >3  Right Sural Anti Sensory (Lat Mall)  32 C  Calf *NR  <4.6  >3     Stim Site NR Onset (ms) Norm Onset (ms) O-P Amp (mV) Norm O-P Amp Site1 Site2 Delta-0 (ms) Dist (cm) Vel (m/s) Norm Vel (m/s)  Left Peroneal Motor (Ext Dig Brev)  32 C  Ankle    3.6 <6.0 *0.4 >2.5 B Fib Ankle 7.5 34.0 45 >40  B Fib    11.1  0.3  Poplt B Fib 1.6 8.0 50 >40  Poplt    12.7   0.1         Right Peroneal Motor (Ext Dig Brev)  32 C  Ankle    4.6 <6.0 *1.6 >2.5 B Fib Ankle 7.7 36.0 47 >40  B Fib    12.3  1.4  Poplt B Fib 2.2 9.0 41 >40  Poplt    14.5  1.2         Left Peroneal TA Motor (Tib Ant)  32 C  Fib Head    3.2 <4.5 5.4 >3 Poplit Fib Head 1.2 8.0 67 >40  Poplit    4.4 <5.7 5.2         Right Peroneal TA Motor (Tib Ant)  32 C  Fib Head    2.2 <4.5 6.1 >3 Poplit Fib Head 1.4 8.0 57 >40  Poplit    3.6 <5.7 6.0         Left Tibial Motor (Abd Hall Brev)  32 C  Ankle *NR  <6.0  >4 Knee Ankle  0.0  >40  Knee *NR            Right Tibial Motor (Abd Hall Brev)  32 C  Ankle    3.9 <6.0 *1.5 >4 Knee Ankle 9.1 39.0 43 >40  Knee    13.0  1.1          Electromyography   Side Muscle Ins.Act  Fibs Fasc Recrt Amp Dur Poly Activation Comment  Right AntTibialis Nml Nml Nml Nml Nml Nml Nml Nml N/A  Right Gastroc Nml Nml Nml Nml Nml Nml Nml Nml N/A  Right Flex Dig Long Nml Nml Nml Nml Nml Nml Nml Nml N/A  Right RectFemoris Nml Nml Nml Nml Nml Nml Nml Nml N/A  Right GluteusMed Nml Nml Nml Nml Nml Nml Nml Nml N/A  Left AntTibialis Nml Nml Nml Nml Nml Nml Nml Nml N/A  Left Gastroc Nml Nml Nml Nml Nml Nml Nml Nml N/A  Left Flex Dig Long Nml Nml Nml Nml Nml Nml Nml Nml N/A  Left RectFemoris Nml Nml Nml Nml Nml Nml Nml Nml N/A  Left GluteusMed Nml Nml Nml Nml Nml Nml Nml Nml N/A      Waveforms:

## 2023-10-11 ENCOUNTER — Ambulatory Visit: Payer: Self-pay | Admitting: Neurology

## 2023-10-17 ENCOUNTER — Encounter: Payer: Self-pay | Admitting: Family Medicine

## 2023-10-17 ENCOUNTER — Ambulatory Visit: Admitting: Family Medicine

## 2023-10-17 VITALS — BP 135/87 | HR 85 | Ht 64.0 in | Wt 196.0 lb

## 2023-10-17 DIAGNOSIS — K219 Gastro-esophageal reflux disease without esophagitis: Secondary | ICD-10-CM | POA: Diagnosis not present

## 2023-10-17 DIAGNOSIS — F32A Depression, unspecified: Secondary | ICD-10-CM

## 2023-10-17 DIAGNOSIS — E1169 Type 2 diabetes mellitus with other specified complication: Secondary | ICD-10-CM | POA: Diagnosis not present

## 2023-10-17 DIAGNOSIS — E781 Pure hyperglyceridemia: Secondary | ICD-10-CM

## 2023-10-17 DIAGNOSIS — F419 Anxiety disorder, unspecified: Secondary | ICD-10-CM

## 2023-10-17 DIAGNOSIS — G609 Hereditary and idiopathic neuropathy, unspecified: Secondary | ICD-10-CM | POA: Diagnosis not present

## 2023-10-17 DIAGNOSIS — Z23 Encounter for immunization: Secondary | ICD-10-CM

## 2023-10-17 LAB — BAYER DCA HB A1C WAIVED: HB A1C (BAYER DCA - WAIVED): 7.8 % — ABNORMAL HIGH (ref 4.8–5.6)

## 2023-10-17 MED ORDER — DULOXETINE HCL 60 MG PO CPEP: 120.0000 mg | ORAL_CAPSULE | Freq: Every day | ORAL | 3 refills | Status: AC

## 2023-10-17 MED ORDER — OMEPRAZOLE 20 MG PO CPDR: 20.0000 mg | DELAYED_RELEASE_CAPSULE | Freq: Every day | ORAL | 3 refills | Status: AC

## 2023-10-17 MED ORDER — SEMAGLUTIDE(0.25 OR 0.5MG/DOS) 2 MG/3ML ~~LOC~~ SOPN: 0.2500 mg | PEN_INJECTOR | SUBCUTANEOUS | 1 refills | Status: AC

## 2023-10-17 NOTE — Progress Notes (Signed)
 BP 135/87   Pulse 85   Ht 5' 4 (1.626 m)   Wt 196 lb (88.9 kg)   SpO2 97%   BMI 33.64 kg/m    Subjective:   Patient ID: Andrea Pittman, female    DOB: [birth year], 68 y.o.   MRN: [id number]  HPI: Andrea Pittman is a 68 y.o. female presenting on 10/17/2023 for Medical Management of Chronic Issues, Diabetes, Anxiety, Depression, and Peripheral Neuropathy   Discussed the use of AI scribe software for clinical note transcription with the patient, who gave verbal consent to proceed.  History of Present Illness   Andrea Pittman is a 68 year old female with diabetes, peripheral neuropathy, hypertriglyceridemia, anxiety, and depression who presents for a recheck of her conditions.  She experiences fluctuations in blood glucose levels. Despite these fluctuations, she believes her dietary habits have improved as she has reduced her intake of sweets and portion sizes. She still consumes some sweets and [place] food but in smaller portions. She is trying to incorporate more salads and is mindful of sugar content in foods. She consumes frosted mini-wheats daily to help with constipation caused by her medication.  She has peripheral neuropathy and diabetes. 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 She experiences heightened sensitivity in her feet and hands, describing it as feeling like she has 'cut her finger off' when she touches something lightly. She currently takes Lyrica  but has reduced her dosage due to side effects such as extreme tiredness and weight gain. Taking Lyrica  twice a day made her too tired to function, so she has reverted to her previous dosage.  She takes Cymbalta  for her anxiety and depression.          Relevant past medical, surgical, family and social history reviewed and updated as indicated. Interim medical history since our last visit reviewed. Allergies and medications reviewed and updated.  Review of Systems  Constitutional:  Negative for chills and  fever.  Eyes:  Negative for visual disturbance.  Respiratory:  Negative for chest tightness and shortness of breath.   Cardiovascular:  Negative for chest pain and leg swelling.  Genitourinary:  Negative for difficulty urinating and dysuria.  Musculoskeletal:  Negative for back pain and gait problem.  Skin:  Negative for rash.  Neurological:  Positive for numbness. Negative for dizziness, light-headedness and headaches.  Psychiatric/Behavioral:  Negative for agitation and behavioral problems.   All other systems reviewed and are negative.   Per HPI unless specifically indicated above   Allergies as of 10/17/2023   No Known Allergies      Medication List        Accurate as of October 17, 2023 11:16 AM. If you have any questions, ask your nurse or doctor.          acetaminophen  500 MG tablet Commonly known as: TYLENOL  Take 2 tablets (1,000 mg total) by mouth every 6 (six) hours as needed.   DULoxetine  60 MG capsule Commonly known as: CYMBALTA  Take 2 capsules (120 mg total) by mouth daily.   estradiol  2 MG tablet Commonly known as: ESTRACE  Take 1 mg by mouth daily.   ibuprofen 200 MG tablet Commonly known as: ADVIL Take 400-600 mg by mouth every 6 (six) hours as needed for fever, headache or mild pain.   omeprazole  20 MG capsule Commonly known as: PRILOSEC Take 1 capsule (20 mg total) by mouth daily.   ondansetron  8 MG disintegrating tablet Commonly known as: ZOFRAN -ODT 8mg  ODT q4  hours prn nausea   pregabalin  100 MG capsule Commonly known as: LYRICA  Take 1 capsule (100 mg total) by mouth 2 (two) times daily.   Semaglutide (0.25 or 0.5MG /DOS) 2 MG/3ML Sopn Inject 0.25 mg into the skin once a week. Started by: Fonda LABOR Shalik Sanfilippo   Vitamin D 50 MCG (2000 UT) Caps Take by mouth.         Objective:   BP 135/87   Pulse 85   Ht 5' 4 (1.626 m)   Wt 196 lb (88.9 kg)   SpO2 97%   BMI 33.64 kg/m   Wt Readings from Last 3 Encounters:  10/17/23 196 lb  (88.9 kg)  09/16/23 196 lb (88.9 kg)  09/10/23 196 lb 6.4 oz (89.1 kg)    Physical Exam Vitals and nursing note reviewed.  Constitutional:      General: She is not in acute distress.    Appearance: She is well-developed. She is not diaphoretic.  Eyes:     Conjunctiva/sclera: Conjunctivae normal.  Cardiovascular:     Rate and Rhythm: Normal rate and regular rhythm.     Heart sounds: Normal heart sounds. No murmur heard. Pulmonary:     Effort: Pulmonary effort is normal. No respiratory distress.     Breath sounds: Normal breath sounds. No wheezing.  Musculoskeletal:        General: No swelling.  Skin:    General: Skin is warm and dry.     Findings: No rash.  Neurological:     Mental Status: She is alert and oriented to person, place, and time.     Coordination: Coordination normal.  Psychiatric:        Behavior: Behavior normal.    Physical Exam   NECK: Thyroid  normal on palpation. CHEST: Lungs clear to auscultation bilaterally. CARDIOVASCULAR: Heart sounds regular.         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)   Relevant Medications   Semaglutide ,0.25 or 0.5MG /DOS, 2 MG/3ML SOPN   Other Relevant Orders   AMB Referral VBCI Care Management   Diabetes mellitus (HCC) - Primary   Relevant Medications   Semaglutide ,0.25 or 0.5MG /DOS, 2 MG/3ML SOPN   Other Relevant Orders   CMP14+EGFR   Bayer DCA Hb A1c Waived   CBC with Differential/Platelet   Lipid panel   AMB Referral VBCI Care Management     Nervous and Auditory   Hereditary and idiopathic peripheral neuropathy   Relevant Medications   DULoxetine  (CYMBALTA ) 60 MG capsule     Other   Anxiety and depression   Relevant Medications   DULoxetine  (CYMBALTA ) 60 MG capsule   Other Visit Diagnoses        Hypertriglyceridemia       Relevant Orders   CMP14+EGFR   Bayer DCA Hb A1c Waived   CBC with Differential/Platelet   Lipid panel          Type 2 diabetes mellitus complicated by diabetic peripheral neuropathy and hypertriglyceridemia Type 2 diabetes poorly controlled with A1c of 7.8. Peripheral neuropathy worsening. Hypertriglyceridemia present. Dietary challenges noted. - Start Ozempic  for diabetes and weight loss. Explained mechanism, GI side effects, and lifestyle changes. - Discuss prescription assistance for Ozempic  with pharmacist Mliss Pope. - Continue dietary modifications focusing on portion control and  reducing sweets. - Monitor A1c and adjust treatment as needed.  Major depressive disorder Managed with Cymbalta .  Generalized anxiety disorder Managed with Cymbalta .  General Health Maintenance Discussed dietary modifications for health improvement and diabetes management. - Encourage dietary modifications focusing on portion control and balanced meals. - Research meal programs for diabetes management.          Follow up plan: Return in about 3 months (around 01/17/2024), or if symptoms worsen or fail to improve, for Diabetes recheck.  Counseling provided for all of the vaccine components Orders Placed This Encounter  Procedures   CMP14+EGFR   Bayer DCA Hb A1c Waived   CBC with Differential/Platelet   Lipid panel   AMB Referral VBCI Care Management    Fonda Levins, MD [facility] 10/17/2023, 11:16 AM

## 2023-10-18 LAB — CMP14+EGFR
ALT: 43 IU/L — ABNORMAL HIGH (ref 0–32)
AST: 45 IU/L — ABNORMAL HIGH (ref 0–40)
Albumin: 3.8 g/dL — ABNORMAL LOW (ref 3.9–4.9)
Alkaline Phosphatase: 96 IU/L (ref 44–121)
BUN/Creatinine Ratio: 16 (ref 12–28)
BUN: 13 mg/dL (ref 8–27)
Bilirubin Total: 0.5 mg/dL (ref 0.0–1.2)
CO2: 24 mmol/L (ref 20–29)
Calcium: 9.1 mg/dL (ref 8.7–10.3)
Chloride: 100 mmol/L (ref 96–106)
Creatinine, Ser: 0.83 mg/dL (ref 0.57–1.00)
Globulin, Total: 3.3 g/dL (ref 1.5–4.5)
Glucose: 152 mg/dL — ABNORMAL HIGH (ref 70–99)
Potassium: 4.1 mmol/L (ref 3.5–5.2)
Sodium: 138 mmol/L (ref 134–144)
Total Protein: 7.1 g/dL (ref 6.0–8.5)
eGFR: 77 mL/min/1.73 (ref >59)

## 2023-10-18 LAB — CBC WITH DIFFERENTIAL/PLATELET
Basophils Absolute: 0.1 x10E3/uL (ref 0.0–0.2)
Basos: 1 %
EOS (ABSOLUTE): 0.4 x10E3/uL (ref 0.0–0.4)
Eos: 5 %
Hematocrit: 42.4 % (ref 34.0–46.6)
Hemoglobin: 13.5 g/dL (ref 11.1–15.9)
Immature Grans (Abs): 0 x10E3/uL (ref 0.0–0.1)
Immature Granulocytes: 0 %
Lymphocytes Absolute: 3.1 x10E3/uL (ref 0.7–3.1)
Lymphs: 33 %
MCH: 29.8 pg (ref 26.6–33.0)
MCHC: 31.8 g/dL (ref 31.5–35.7)
MCV: 94 fL (ref 79–97)
Monocytes Absolute: 0.7 x10E3/uL (ref 0.1–0.9)
Monocytes: 8 %
Neutrophils Absolute: 4.8 x10E3/uL (ref 1.4–7.0)
Neutrophils: 53 %
Platelets: 338 x10E3/uL (ref 150–450)
RBC: 4.53 x10E6/uL (ref 3.77–5.28)
RDW: 12.7 % (ref 11.7–15.4)
WBC: 9.2 x10E3/uL (ref 3.4–10.8)

## 2023-10-18 LAB — LIPID PANEL
Chol/HDL Ratio: 4.8 ratio — ABNORMAL HIGH (ref 0.0–4.4)
Cholesterol, Total: 176 mg/dL (ref 100–199)
HDL: 37 mg/dL — ABNORMAL LOW (ref >39)
LDL Chol Calc (NIH): 101 mg/dL — ABNORMAL HIGH (ref 0–99)
Triglycerides: 221 mg/dL — ABNORMAL HIGH (ref 0–149)
VLDL Cholesterol Cal: 38 mg/dL (ref 5–40)

## 2023-10-23 ENCOUNTER — Telehealth: Payer: Self-pay

## 2023-10-23 NOTE — Telephone Encounter (Signed)
 Copied from CRM #[id number]. Topic: Clinical - Prescription Issue >> Oct 23, 2023  2:53 PM Tobias CROME wrote: Reason for CRM: Patient requesting call back from Dr. Williemae nurse. Patient has not heard from [facility] the pharmacist and went to pick up the ozempic  and was told the price would be $350.   Patient requesting call back, [phone]

## 2023-10-24 NOTE — Telephone Encounter (Signed)
 Patient would like to get samples until she is set up on patient assistance. Mliss said we are out right now but would get some next week. Please let patient know when she can get samples.

## 2023-10-25 ENCOUNTER — Ambulatory Visit: Payer: Self-pay | Admitting: Family Medicine

## 2023-10-29 ENCOUNTER — Telehealth: Payer: Self-pay

## 2023-10-29 NOTE — Progress Notes (Signed)
 Care Guide Pharmacy Note  10/29/2023 Name: OAKLIE DURRETT MRN: [id number] DOB: [birth year]  Referred By: Maryanne Fonda LABOR, MD Reason for referral: Complex Care Management (Outreach to schedule with Pharm d )   KENYONNA MICEK is a 68 y.o. year old female who is a primary care patient of Dettinger, Fonda LABOR, MD.  [name] was referred to the pharmacist for assistance related to: osteoporosis   An unsuccessful telephone outreach was attempted today to contact the patient who was referred to the pharmacy team for assistance with medication management. Additional attempts will be made to contact the patient.  Jeoffrey Buffalo , RMA     [facility]  [facility], [facility] Guide  Direct Dial: [phone]  Website: [email or web address]

## 2023-10-31 NOTE — Telephone Encounter (Signed)
 Patient came in to asked about the patient assistance and told patient Andrea Pittman reached out to her on 8-26 and couldn't get her on the phone. Please call patient soon. Still don't have Ozempic  and needs to know what to do .

## 2023-11-08 NOTE — Progress Notes (Signed)
 Care Guide Pharmacy Note  11/08/2023 Name: Andrea Pittman MRN: [id number] DOB: [birth year]  Referred By: Maryanne Fonda LABOR, MD Reason for referral: Complex Care Management (Outreach to schedule with Pharm d )   Andrea Pittman is a 68 y.o. year old female who is a primary care patient of Dettinger, Fonda LABOR, MD.  [name] was referred to the pharmacist for assistance related to: DMII  Successful contact was made with the patient to discuss pharmacy services including being ready for the pharmacist to call at least 5 minutes before the scheduled appointment time and to have medication bottles and any blood pressure readings ready for review. The patient agreed to meet with the pharmacist via telephone visit on (date/time).11/28/2023  Jeoffrey Buffalo , RMA     [place]  [facility], [facility] Guide  Direct Dial: [phone]  Website: [email or web address]

## 2023-11-20 ENCOUNTER — Other Ambulatory Visit (INDEPENDENT_AMBULATORY_CARE_PROVIDER_SITE_OTHER)

## 2023-11-20 DIAGNOSIS — Z7985 Long-term (current) use of injectable non-insulin antidiabetic drugs: Secondary | ICD-10-CM

## 2023-11-20 DIAGNOSIS — E119 Type 2 diabetes mellitus without complications: Secondary | ICD-10-CM

## 2023-11-20 NOTE — Progress Notes (Signed)
 11/20/2023 Name: Andrea Pittman MRN: [id number] DOB: 09-Dec-1955  Chief Complaint  Patient presents with   Diabetes    Andrea Pittman is a 68 y.o. year old female who presented for a telephone visit.   They were referred to the pharmacist by their PCP for assistance in managing diabetes, medication access, and complex medication management.    Subjective:  Care Team: Primary Care Provider: Dettinger, Fonda LABOR, MD ; Next Scheduled Visit: 01/24/24   Medication Access/Adherence  Current Pharmacy:  [facility] - [place], [place] - [address] [place] [address] Phone: [phone] Fax: [phone]   Patient reports affordability concerns with their medications: Yes --ozempic  is $370 for 1st month Reports it will be $30/month thereafter  Patient reports access/transportation concerns to their pharmacy: No  Patient reports adherence concerns with their medications:  No     Diabetes:  Current medications: none, will trial ozempic  Medications tried in the past: none  Current glucose readings: FBG<140  Current meal patterns:  Will discuss at appt tomorrow in person  Current physical activity: encouraged as able  Current medication access support: n/a  Macrovascular and Microvascular Risk Reduction:  Statin? no; no history of statin therapy; ACEi/ARB? no; no history of therapy but is indicated Last urinary albumin/creatinine ratio:  Lab Results  Component Value Date   MICRALBCREAT 90 (H) 07/15/2023   Last eye exam:  Lab Results  Component Value Date   HMDIABEYEEXA No Retinopathy 12/26/2022   Last foot exam: 03/07/2023 Tobacco Use:  Tobacco Use: Low Risk  (10/17/2023)   Patient History    Smoking Tobacco Use: Never    Smokeless Tobacco Use: Never    Passive Exposure: Not on file   Objective:  Lab Results  Component Value Date   HGBA1C 7.8 (H) 10/17/2023    Lab Results  Component Value Date   CREATININE 0.83 10/17/2023   BUN  13 10/17/2023   NA 138 10/17/2023   K 4.1 10/17/2023   CL 100 10/17/2023   CO2 24 10/17/2023    Lab Results  Component Value Date   CHOL 176 10/17/2023   HDL 37 (L) 10/17/2023   LDLCALC 101 (H) 10/17/2023   TRIG 221 (H) 10/17/2023   CHOLHDL 4.8 (H) 10/17/2023    Medications Reviewed Today     Reviewed by Billee Mliss BIRCH, [facility] (Pharmacist) on 11/20/23 at 1259  Med List Status: <None>   Medication Order Taking? Sig Documenting Provider Last Dose Status Informant  acetaminophen  (TYLENOL ) 500 MG tablet [id number]  Take 2 tablets (1,000 mg total) by mouth every 6 (six) hours as needed. Tammy Sor, PA-C  Active   Cholecalciferol (VITAMIN D) 50 MCG (2000 UT) CAPS [id number]  Take by mouth. [provider]  Active   DULoxetine  (CYMBALTA ) 60 MG capsule [id number]  Take 2 capsules (120 mg total) by mouth daily. Dettinger, Fonda LABOR, MD  Active   estradiol  (ESTRACE ) 2 MG tablet [id number]  Take 1 mg by mouth daily. [provider]  Active Self  ibuprofen (ADVIL) 200 MG tablet [id number]  Take 400-600 mg by mouth every 6 (six) hours as needed for fever, headache or mild pain. [provider]  Active Self  omeprazole  (PRILOSEC) 20 MG capsule [id number]  Take 1 capsule (20 mg total) by mouth daily. Dettinger, Fonda LABOR, MD  Active   ondansetron  (ZOFRAN -ODT) 8 MG disintegrating tablet [id number]  8mg  ODT q4 hours prn nausea [name], MD  Active   pregabalin  (  LYRICA ) 100 MG capsule [id number]  Take 1 capsule (100 mg total) by mouth 2 (two) times daily. Patel, Donika K, DO  Active   Semaglutide ,0.25 or 0.5MG /DOS, 2 MG/3ML SOPN [id number]  Inject 0.25 mg into the skin once a week. Dettinger, Fonda LABOR, MD  Active               Assessment/Plan:   Diabetes: - Currently uncontrolled; goal A1c <7%. Cardiorenal risk reduction is opportunities for improvement.. Blood pressure is at goal <130/80. LDL is not at goal.  - Reviewed long term cardiovascular and renal outcomes of  uncontrolled blood sugar., Reviewed goal A1c, goal fasting, and goal 2 hour post prandial glucose. Recommended to check glucose fasting or if symptomatic, and Reviewed signs and symptoms of hypoglycemia. - Recommend to start Ozempic  0.25mg  weekly . - Patient denies personal or family history of multiple endocrine neoplasia type 2, medullary thyroid  cancer; personal history of pancreatitis or gallbladder disease., Discussed side effects of gastrointestinal upset/nausea; eating smaller meals, avoiding high-fat foods, and remaining upright after eating may reduce nausea. Discussed that overeating is a major trigger of nausea with this class of medications, as often times patients will start to feel full sooner and may need to decrease portion sizes from what they were previously accustomed to.   Follow Up Plan: tomorrow; review Ozempic  injection technique  Mliss Tarry Griffin, PharmD, BCACP, CPP Clinical Pharmacist, [facility]

## 2023-11-21 ENCOUNTER — Ambulatory Visit

## 2023-12-19 ENCOUNTER — Telehealth: Payer: Self-pay

## 2023-12-19 NOTE — Telephone Encounter (Signed)
 Patient is calling to follow up on Semaglutide ,0.25 or 0.5MG /DOS, 2 MG/3ML SOPN [[id number]] , Patient reporting 4 files are .25mg  and 2- .50mg .  Patient has used all of the .25mg  should she start the .50 for this Sunday. Please advise  Patient reporting that she will be travel out of town tomorrow at noon.

## 2023-12-19 NOTE — Telephone Encounter (Signed)
 Pt informed that she should increase her dose to the .5. She has been taking .25 for one month without difficulty. Pt will call us  back in 2w with an update and to send in refills of the .5. Pt will be using the drug store in [place].

## 2023-12-19 NOTE — Telephone Encounter (Signed)
 Copied from CRM [id number]. Topic: Clinical - Medication Question >> Dec 18, 2023  4:22 PM Miquel SAILOR wrote: Reason for CRM: Ozempic -Patient stated out of insulin and needs call back on clarifying instructions on how to take this medication. No medication on list as well   [phone]

## 2024-01-02 LAB — OPHTHALMOLOGY REPORT-SCANNED

## 2024-01-24 ENCOUNTER — Encounter: Payer: Self-pay | Admitting: Family Medicine

## 2024-01-24 ENCOUNTER — Ambulatory Visit (INDEPENDENT_AMBULATORY_CARE_PROVIDER_SITE_OTHER): Payer: Self-pay | Admitting: Family Medicine

## 2024-01-24 VITALS — BP 117/69 | HR 79 | Temp 97.0°F | Ht 64.0 in | Wt 188.2 lb

## 2024-01-24 DIAGNOSIS — F339 Major depressive disorder, recurrent, unspecified: Secondary | ICD-10-CM

## 2024-01-24 DIAGNOSIS — E1169 Type 2 diabetes mellitus with other specified complication: Secondary | ICD-10-CM | POA: Diagnosis not present

## 2024-01-24 DIAGNOSIS — F411 Generalized anxiety disorder: Secondary | ICD-10-CM

## 2024-01-24 DIAGNOSIS — E781 Pure hyperglyceridemia: Secondary | ICD-10-CM

## 2024-01-24 DIAGNOSIS — G609 Hereditary and idiopathic neuropathy, unspecified: Secondary | ICD-10-CM | POA: Diagnosis not present

## 2024-01-24 DIAGNOSIS — Z7985 Long-term (current) use of injectable non-insulin antidiabetic drugs: Secondary | ICD-10-CM

## 2024-01-24 LAB — CMP14+EGFR
ALT: 26 IU/L (ref 0–32)
AST: 22 IU/L (ref 0–40)
Albumin: 3.9 g/dL (ref 3.9–4.9)
Alkaline Phosphatase: 87 IU/L (ref 49–135)
BUN/Creatinine Ratio: 16 (ref 12–28)
BUN: 16 mg/dL (ref 8–27)
Bilirubin Total: 0.6 mg/dL (ref 0.0–1.2)
CO2: 23 mmol/L (ref 20–29)
Calcium: 9.3 mg/dL (ref 8.7–10.3)
Chloride: 101 mmol/L (ref 96–106)
Creatinine, Ser: 1 mg/dL (ref 0.57–1.00)
Globulin, Total: 3.4 g/dL (ref 1.5–4.5)
Glucose: 95 mg/dL (ref 70–99)
Potassium: 4.4 mmol/L (ref 3.5–5.2)
Sodium: 138 mmol/L (ref 134–144)
Total Protein: 7.3 g/dL (ref 6.0–8.5)
eGFR: 61 mL/min/1.73 (ref >59)

## 2024-01-24 LAB — BAYER DCA HB A1C WAIVED: HB A1C (BAYER DCA - WAIVED): 6.1 % — ABNORMAL HIGH (ref 4.8–5.6)

## 2024-01-24 NOTE — Progress Notes (Signed)
 BP 117/69   Pulse 79   Temp (!) 97 F (36.1 C)   Ht 5' 4 (1.626 m)   Wt 188 lb 3.2 oz (85.4 kg)   SpO2 96%   BMI 32.30 kg/m    Subjective:   Patient ID: Andrea Pittman, female    DOB: Jul 23, 1955, 68 y.o.   MRN: [id number]  HPI: Andrea Pittman is a 68 y.o. female presenting on 01/24/2024 for Medical Management of Chronic Issues   Discussed the use of AI scribe software for clinical note transcription with the patient, who gave verbal consent to proceed.  History of Present Illness   Andrea Pittman is a 68 year old female with diabetes who presents for routine follow-up.  Glycemic control - Improved blood glucose management with A1c decreased from 7.8 to 6.1 - Dietary modifications include portion control during meals, especially during the holiday season - Limits intake of sweets to smaller portions despite enjoying them  Appetite suppression and nutritional intake - Currently taking Ozempic , resulting in appetite suppression - Difficulty consuming adequate protein due to decreased appetite - Supplements diet with protein drinks containing 30 grams of protein each  Peripheral neuropathy - Continues to experience neuropathy symptoms, though improved with better glycemic control - Takes Cymbalta  for neuropathy, which is helpful but some pain persists        01/24/2024   10:15 AM 01/24/2024   10:10 AM 10/17/2023   10:41 AM 09/16/2023    1:03 PM 03/07/2023   10:33 AM  Depression screen PHQ 2/9  Decreased Interest 1 0 1 1 2   Down, Depressed, Hopeless 0 0 0 0 1  PHQ - 2 Score 1 0 1 1 3   Altered sleeping 2  2 1 2   Tired, decreased energy 2  3 1 3   Change in appetite 1  0 0 0  Feeling bad or failure about yourself  0  0 0 0  Trouble concentrating 0  1 0 1  Moving slowly or fidgety/restless 0  0 0 0  Suicidal thoughts 0  0 0 0  PHQ-9 Score 6  7  3  9    Difficult doing work/chores Somewhat difficult  Somewhat difficult Not difficult at all Somewhat difficult     Data saved  with a previous flowsheet row definition        Relevant past medical, surgical, family and social history reviewed and updated as indicated. Interim medical history since our last visit reviewed. Allergies and medications reviewed and updated.  Review of Systems  Constitutional:  Negative for chills and fever.  Eyes:  Negative for visual disturbance.  Respiratory:  Negative for chest tightness and shortness of breath.   Cardiovascular:  Negative for chest pain and leg swelling.  Musculoskeletal:  Negative for back pain and gait problem.  Skin:  Negative for rash.  Neurological:  Negative for dizziness, light-headedness and headaches.  Psychiatric/Behavioral:  Negative for agitation and behavioral problems.   All other systems reviewed and are negative.   Per HPI unless specifically indicated above   Allergies as of 01/24/2024   No Known Allergies      Medication List        Accurate as of January 24, 2024 10:43 AM. If you have any questions, ask your nurse or doctor.          acetaminophen  500 MG tablet Commonly known as: TYLENOL  Take 2 tablets (1,000 mg total) by mouth every 6 (six) hours as needed.   DULoxetine   60 MG capsule Commonly known as: CYMBALTA  Take 2 capsules (120 mg total) by mouth daily.   estradiol  2 MG tablet Commonly known as: ESTRACE  Take 1 mg by mouth daily.   ibuprofen 200 MG tablet Commonly known as: ADVIL Take 400-600 mg by mouth every 6 (six) hours as needed for fever, headache or mild pain.   omeprazole  20 MG capsule Commonly known as: PRILOSEC Take 1 capsule (20 mg total) by mouth daily.   ondansetron  8 MG disintegrating tablet Commonly known as: ZOFRAN -ODT 8mg  ODT q4 hours prn nausea   pregabalin  100 MG capsule Commonly known as: LYRICA  Take 1 capsule (100 mg total) by mouth 2 (two) times daily.   Semaglutide (0.25 or 0.5MG /DOS) 2 MG/3ML Sopn Inject 0.25 mg into the skin once a week.   Vitamin D 50 MCG (2000 UT) Caps Take  by mouth.         Objective:   BP 117/69   Pulse 79   Temp (!) 97 F (36.1 C)   Ht 5' 4 (1.626 m)   Wt 188 lb 3.2 oz (85.4 kg)   SpO2 96%   BMI 32.30 kg/m   Wt Readings from Last 3 Encounters:  01/24/24 188 lb 3.2 oz (85.4 kg)  10/17/23 196 lb (88.9 kg)  09/16/23 196 lb (88.9 kg)    Physical Exam Vitals and nursing note reviewed.  Constitutional:      General: She is not in acute distress.    Appearance: She is well-developed. She is not diaphoretic.  Eyes:     Conjunctiva/sclera: Conjunctivae normal.     Pupils: Pupils are equal, round, and reactive to light.  Cardiovascular:     Rate and Rhythm: Normal rate and regular rhythm.     Heart sounds: Normal heart sounds. No murmur heard. Pulmonary:     Effort: Pulmonary effort is normal. No respiratory distress.     Breath sounds: Normal breath sounds. No wheezing.  Musculoskeletal:        General: No swelling.  Skin:    General: Skin is warm and dry.     Findings: No rash.  Neurological:     Mental Status: She is alert and oriented to person, place, and time.     Coordination: Coordination normal.  Psychiatric:        Behavior: Behavior normal.    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 Peripheral neuropathy symptoms improved but some pain persists. Ozempic  effective for glycemic control and appetite suppression. - Encouraged dietary modifications with smaller portions and occasional indulgences. - Follow up in 3 months for diabetes  management review.  Depression Well-managed with Cymbalta . - Continue Cymbalta  for depression management.          Follow up plan: Return in about 3 months (around 04/25/2024), or if symptoms worsen or fail to improve, for Diabetes recheck.  Counseling provided for all of the vaccine components Orders Placed This Encounter  Procedures   Bayer [facility] Hb A1c Waived   CMP14+EGFR    Fonda Levins, MD [facility] 01/24/2024, 10:43 AM

## 2024-01-27 ENCOUNTER — Ambulatory Visit: Payer: Self-pay | Admitting: Family Medicine

## 2024-03-16 ENCOUNTER — Encounter (HOSPITAL_COMMUNITY): Payer: Self-pay

## 2024-03-16 ENCOUNTER — Emergency Department (HOSPITAL_BASED_OUTPATIENT_CLINIC_OR_DEPARTMENT_OTHER)

## 2024-03-16 ENCOUNTER — Inpatient Hospital Stay (HOSPITAL_BASED_OUTPATIENT_CLINIC_OR_DEPARTMENT_OTHER): Admission: EM | Admit: 2024-03-16 | Discharge: 2024-04-07 | Disposition: A | Discharge status: Home / Self Care

## 2024-03-16 ENCOUNTER — Other Ambulatory Visit: Payer: Self-pay

## 2024-03-16 DIAGNOSIS — K3532 Acute appendicitis with perforation and localized peritonitis, without abscess: Principal | ICD-10-CM | POA: Diagnosis present

## 2024-03-16 HISTORY — DX: Type 2 diabetes mellitus without complications: E11.9

## 2024-03-16 LAB — COMPREHENSIVE METABOLIC PANEL WITH GFR
ALT: 14 U/L (ref 0–44)
AST: 18 U/L (ref 15–41)
Albumin: 3.6 g/dL (ref 3.5–5.0)
Alkaline Phosphatase: 106 U/L (ref 38–126)
Anion gap: 13 (ref 5–15)
BUN: 22 mg/dL (ref 8–23)
CO2: 24 mmol/L (ref 22–32)
Calcium: 9.3 mg/dL (ref 8.9–10.3)
Chloride: 95 mmol/L — ABNORMAL LOW (ref 98–111)
Creatinine, Ser: 1.15 mg/dL — ABNORMAL HIGH (ref 0.44–1.00)
GFR, Estimated: 51 mL/min — ABNORMAL LOW
Glucose, Bld: 174 mg/dL — ABNORMAL HIGH (ref 70–99)
Potassium: 3.9 mmol/L (ref 3.5–5.1)
Sodium: 132 mmol/L — ABNORMAL LOW (ref 135–145)
Total Bilirubin: 1.3 mg/dL — ABNORMAL HIGH (ref 0.0–1.2)
Total Protein: 7.8 g/dL (ref 6.5–8.1)

## 2024-03-16 LAB — URINALYSIS, ROUTINE W REFLEX MICROSCOPIC
Bilirubin Urine: NEGATIVE
Glucose, UA: NEGATIVE mg/dL
Hgb urine dipstick: NEGATIVE
Ketones, ur: NEGATIVE mg/dL
Nitrite: NEGATIVE
Protein, ur: 100 mg/dL — AB
Specific Gravity, Urine: 1.03 (ref 1.005–1.030)
pH: 6 (ref 5.0–8.0)

## 2024-03-16 LAB — CBC
HCT: 42.5 % (ref 36.0–46.0)
Hemoglobin: 14.4 g/dL (ref 12.0–15.0)
MCH: 30.1 pg (ref 26.0–34.0)
MCHC: 33.9 g/dL (ref 30.0–36.0)
MCV: 88.9 fL (ref 80.0–100.0)
Platelets: 373 K/uL (ref 150–400)
RBC: 4.78 MIL/uL (ref 3.87–5.11)
RDW: 13.6 % (ref 11.5–15.5)
WBC: 33.3 K/uL — ABNORMAL HIGH (ref 4.0–10.5)
nRBC: 0 % (ref 0.0–0.2)

## 2024-03-16 LAB — HIV ANTIBODY (ROUTINE TESTING W REFLEX): HIV Screen 4th Generation wRfx: NONREACTIVE

## 2024-03-16 LAB — LIPASE, BLOOD: Lipase: 20 U/L (ref 11–51)

## 2024-03-16 MED ORDER — MORPHINE SULFATE (PF) 4 MG/ML IV SOLN
4.0000 mg | Freq: Once | INTRAVENOUS | Status: AC
  Administered 2024-03-16: 4 mg via INTRAVENOUS
  Filled 2024-03-16: qty 1

## 2024-03-16 MED ORDER — DIPHENHYDRAMINE HCL 12.5 MG/5ML PO ELIX: 12.5000 mg | ORAL_SOLUTION | Freq: Four times a day (QID) | ORAL | Status: DC | PRN

## 2024-03-16 MED ORDER — SODIUM CHLORIDE 0.9 % IV BOLUS
1000.0000 mL | Freq: Once | INTRAVENOUS | Status: AC
  Administered 2024-03-16: 1000 mL via INTRAVENOUS

## 2024-03-16 MED ORDER — DIPHENHYDRAMINE HCL 50 MG/ML IJ SOLN
12.5000 mg | Freq: Four times a day (QID) | INTRAMUSCULAR | Status: DC | PRN
  Administered 2024-04-01: 12.5 mg via INTRAVENOUS
  Filled 2024-03-16: qty 1

## 2024-03-16 MED ORDER — IOHEXOL 300 MG/ML  SOLN
100.0000 mL | Freq: Once | INTRAMUSCULAR | Status: AC | PRN
  Administered 2024-03-16: 100 mL via INTRAVENOUS

## 2024-03-16 MED ORDER — LACTATED RINGERS IV SOLN: INTRAVENOUS | Status: AC

## 2024-03-16 MED ORDER — ONDANSETRON 4 MG PO TBDP
4.0000 mg | ORAL_TABLET | Freq: Four times a day (QID) | ORAL | Status: DC | PRN
  Administered 2024-03-30: 4 mg via ORAL
  Filled 2024-03-16: qty 1

## 2024-03-16 MED ORDER — HYDROMORPHONE HCL 1 MG/ML IJ SOLN
0.5000 mg | INTRAMUSCULAR | Status: DC | PRN
  Administered 2024-03-17 – 2024-04-06 (×39): 0.5 mg via INTRAVENOUS
  Filled 2024-03-16 (×10): qty 0.5
  Filled 2024-03-16: qty 1
  Filled 2024-03-16 (×29): qty 0.5

## 2024-03-16 MED ORDER — MELATONIN 3 MG PO TABS
3.0000 mg | ORAL_TABLET | Freq: Every evening | ORAL | Status: AC | PRN
  Administered 2024-03-18 – 2024-03-19 (×2): 3 mg via ORAL
  Filled 2024-03-16 (×2): qty 1

## 2024-03-16 MED ORDER — ENOXAPARIN SODIUM 40 MG/0.4ML IJ SOSY
40.0000 mg | PREFILLED_SYRINGE | INTRAMUSCULAR | Status: DC
  Administered 2024-03-16 – 2024-03-18 (×3): 40 mg via SUBCUTANEOUS
  Filled 2024-03-16 (×3): qty 0.4

## 2024-03-16 MED ORDER — PIPERACILLIN-TAZOBACTAM 3.375 G IVPB
3.3750 g | Freq: Three times a day (TID) | INTRAVENOUS | Status: AC
  Administered 2024-03-17 – 2024-03-23 (×21): 3.375 g via INTRAVENOUS
  Filled 2024-03-16 (×21): qty 50

## 2024-03-16 MED ORDER — ONDANSETRON HCL 4 MG/2ML IJ SOLN
4.0000 mg | Freq: Four times a day (QID) | INTRAMUSCULAR | Status: DC | PRN
  Administered 2024-03-20 – 2024-03-30 (×6): 4 mg via INTRAVENOUS
  Filled 2024-03-16 (×8): qty 2

## 2024-03-16 MED ORDER — ONDANSETRON HCL 4 MG/2ML IJ SOLN
4.0000 mg | Freq: Once | INTRAMUSCULAR | Status: AC
  Administered 2024-03-16: 4 mg via INTRAVENOUS
  Filled 2024-03-16: qty 2

## 2024-03-16 MED ORDER — PIPERACILLIN-TAZOBACTAM 3.375 G IVPB 30 MIN
3.3750 g | Freq: Once | INTRAVENOUS | Status: AC
  Administered 2024-03-16: 3.375 g via INTRAVENOUS
  Filled 2024-03-16: qty 50

## 2024-03-16 MED ORDER — ACETAMINOPHEN 500 MG PO TABS
1000.0000 mg | ORAL_TABLET | Freq: Four times a day (QID) | ORAL | Status: DC | PRN
  Administered 2024-03-16 – 2024-03-19 (×4): 1000 mg via ORAL
  Filled 2024-03-16 (×4): qty 2

## 2024-03-16 MED ORDER — DULOXETINE HCL 60 MG PO CPEP
120.0000 mg | ORAL_CAPSULE | Freq: Every day | ORAL | Status: AC
  Administered 2024-03-17 – 2024-03-25 (×8): 120 mg via ORAL
  Filled 2024-03-16: qty 4
  Filled 2024-03-16 (×8): qty 2

## 2024-03-16 MED ORDER — PREGABALIN 100 MG PO CAPS
100.0000 mg | ORAL_CAPSULE | Freq: Two times a day (BID) | ORAL | Status: AC
  Administered 2024-03-16 – 2024-03-25 (×18): 100 mg via ORAL
  Filled 2024-03-16 (×19): qty 1

## 2024-03-16 MED ORDER — PANTOPRAZOLE SODIUM 40 MG PO TBEC
40.0000 mg | DELAYED_RELEASE_TABLET | Freq: Every day | ORAL | Status: DC
  Administered 2024-03-17 – 2024-03-25 (×8): 40 mg via ORAL
  Filled 2024-03-16 (×9): qty 1

## 2024-03-16 NOTE — ED Provider Notes (Signed)
 " [place] [facility] Provider Note   CSN: [id number] Arrival date & time: 03/16/24  1452     Patient presents with: Abdominal Pain   TIAJA HAGAN is a 69 y.o. female.   Patient to ED with RLQ abdominal pain x 3 days. No known fever. Nausea with infrequent vomiting. She denies diarrhea and reports she has had constipation since starting Ozempic . Has been on this GLP-1 for a long time without recent dose changes. She denies dysuria, but reports her urine is very dark. She also reports she usually has 3-4 episodes of nighttime urination but did not have to get up at all last night.   The history is provided by the patient and the spouse. No language interpreter was used.  Abdominal Pain      Prior to Admission medications  Medication Sig Start Date End Date Taking? Authorizing Provider  acetaminophen  (TYLENOL ) 500 MG tablet Take 2 tablets (1,000 mg total) by mouth every 6 (six) hours as needed. 09/23/20   Tammy Sor, PA-C  Cholecalciferol (VITAMIN D) 50 MCG (2000 UT) CAPS Take by mouth.    [provider]  DULoxetine  (CYMBALTA ) 60 MG capsule Take 2 capsules (120 mg total) by mouth daily. 10/17/23   Dettinger, Fonda LABOR, MD  estradiol  (ESTRACE ) 2 MG tablet Take 1 mg by mouth daily. 08/03/20   [provider]  ibuprofen (ADVIL) 200 MG tablet Take 400-600 mg by mouth every 6 (six) hours as needed for fever, headache or mild pain.    [provider]  omeprazole  (PRILOSEC) 20 MG capsule Take 1 capsule (20 mg total) by mouth daily. 10/17/23   Dettinger, Fonda LABOR, MD  ondansetron  (ZOFRAN -ODT) 8 MG disintegrating tablet 8mg  ODT q4 hours prn nausea 03/13/23   [name], MD  pregabalin  (LYRICA ) 100 MG capsule Take 1 capsule (100 mg total) by mouth 2 (two) times daily. 09/10/23   Patel, Donika K, DO  Semaglutide ,0.25 or 0.5MG /DOS, 2 MG/3ML SOPN Inject 0.25 mg into the skin once a week. 10/17/23   Dettinger, Fonda LABOR, MD    Allergies:  Patient has no known allergies.    Review of Systems  Gastrointestinal:  Positive for abdominal pain.    Updated Vital Signs BP 123/69   Pulse 100   Temp 98.5 F (36.9 C)   Resp 18   Wt 84.8 kg   SpO2 94%   BMI 32.10 kg/m   Physical Exam Vitals and nursing note reviewed.  Constitutional:      Appearance: She is well-developed. She is obese.  HENT:     Head: Normocephalic.  Cardiovascular:     Rate and Rhythm: Normal rate and regular rhythm.     Heart sounds: No murmur heard. Pulmonary:     Effort: Pulmonary effort is normal.     Breath sounds: Normal breath sounds. No wheezing, rhonchi or rales.  Abdominal:     General: Bowel sounds are normal.     Palpations: Abdomen is soft.     Tenderness: There is abdominal tenderness in the right lower quadrant. There is guarding. There is no rebound. Negative signs include obturator sign.  Musculoskeletal:        General: Normal range of motion.     Cervical back: Normal range of motion and neck supple.  Skin:    General: Skin is warm and dry.  Neurological:     General: No focal deficit present.     Mental Status: She is alert and oriented  to person, place, and time.     (all labs ordered are listed, but only abnormal results are displayed) Labs Reviewed  COMPREHENSIVE METABOLIC PANEL WITH GFR - Abnormal; Notable for the following components:      Result Value   Sodium 132 (*)    Chloride 95 (*)    Glucose, Bld 174 (*)    Creatinine, Ser 1.15 (*)    Total Bilirubin 1.3 (*)    GFR, Estimated 51 (*)    All other components within normal limits  CBC - Abnormal; Notable for the following components:   WBC 33.3 (*)    All other components within normal limits  URINALYSIS, ROUTINE W REFLEX MICROSCOPIC - Abnormal; Notable for the following components:   APPearance HAZY (*)    Protein, ur 100 (*)    Leukocytes,Ua MODERATE (*)    Bacteria, UA RARE (*)    All other components within normal limits  LIPASE, BLOOD     EKG: None  Radiology: CT ABDOMEN PELVIS W CONTRAST Result Date: 03/16/2024 CLINICAL DATA:  Abdominal pain, acute, nonlocalized. EXAM: CT ABDOMEN AND PELVIS WITH CONTRAST TECHNIQUE: Multidetector CT imaging of the abdomen and pelvis was performed using the standard protocol following bolus administration of intravenous contrast. RADIATION DOSE REDUCTION: This exam was performed according to the departmental dose-optimization program which includes automated exposure control, adjustment of the mA and/or kV according to patient size and/or use of iterative reconstruction technique. CONTRAST:  OMNIPAQUE  IOHEXOL  300 MG/ML  SOLN COMPARISON:  08/26/2020 FINDINGS: Lower chest: Lung bases are clear. Hepatobiliary: Normal appearance of the liver. Main portal venous system is patent. Cholecystectomy. Small amount of gas within the biliary system and likely related to previous ERCP. No significant biliary dilatation. Pancreas: Small fat foci in the head and uncinate process. No evidence for pancreatic duct dilatation or pancreatic inflammation. Spleen: Normal in size without focal abnormality. Adrenals/Urinary Tract: Normal adrenal glands. Normal appearance of both kidneys without hydronephrosis. Normal urinary bladder. 8 mm hypodensity in the left kidney upper pole on image 30/2. This is better seen on the coronal images and the Hounsfield units measure 26 on image 37/4. This likely represents a left renal cyst. No suspicious renal lesion. Stomach/Bowel: Normal stomach. Large amount of inflammation with small pockets of free air in the right lower quadrant. Diffuse wall thickening in the terminal ileum. Inflammatory process in right lower quadrant is most compatible with a perforated appendicitis. Base of the appendix is probably on image 69/2 and difficult to exclude an appendicolith in this area. No evidence for bowel dilatation or obstruction. Vascular/Lymphatic: Mild atherosclerotic disease in the  abdominal aorta without aneurysm. Main visceral arteries are patent. No significant lymph node enlargement in the abdomen or pelvis. Reproductive: Status post hysterectomy. No adnexal masses. Other: Large amount of inflammation in the right lower quadrant centered around the perforated appendicitis. Small collection of fluid in the right lower quadrant on image 71/2 but not clear if this is within the cecum or extraluminal. There is not a well-defined or discrete abscess collection in the right lower quadrant at this time. Small umbilical hernia containing fat. Musculoskeletal: Chronic sclerotic focus involving the left pubic bone. No acute bone abnormality. IMPRESSION: 1. Perforated appendicitis. Large amount of inflammation in the right lower quadrant centered around the perforated appendix. Multiple small pockets of extraluminal gas. No well-defined abscess collection at this time. 2. Diffuse wall thickening in the terminal ileum is likely secondary to the perforated appendicitis. 3. Aortic Atherosclerosis (ICD10-I70.0). These results  were called by telephone at the time of interpretation on 03/16/2024 at 5:17 pm to provider ADAM CURATOLO , who verbally acknowledged these results. Electronically Signed   By: Juliene Balder M.D.   On: 03/16/2024 17:19     Procedures   Medications Ordered in the ED  piperacillin -tazobactam (ZOSYN ) IVPB 3.375 g (3.375 g Intravenous New Bag/Given 03/16/24 1737)  iohexol  (OMNIPAQUE ) 300 MG/ML solution 100 mL (100 mLs Intravenous Contrast Given 03/16/24 1648)  morphine  (PF) 4 MG/ML injection 4 mg (4 mg Intravenous Given 03/16/24 1725)  ondansetron  (ZOFRAN ) injection 4 mg (4 mg Intravenous Given 03/16/24 1725)  sodium chloride  0.9 % bolus 1,000 mL (1,000 mLs Intravenous New Bag/Given 03/16/24 1725)    Clinical Course as of 03/16/24 1742  Mon Mar 16, 2024  1738 Patient to ED with progressively worsening RLQ abdominal pain, generalized weakness x 3 days. Tender to soft abdomen in  the RLQ. WBC >33, No UTI.   CT abd/pel per radiology, showing perforated appendix. Zosyn  IV ordered, 1 liter fluids. These results were discussed with gen surg, Dr. Dasie, who accepts the patient for direct admit to her service at [facility]. Patient and spouse updated on plan. Transfer initiated.  [SU]    Clinical Course User Index [SU] Odell Balls, PA-C                                 Medical Decision Making Amount and/or Complexity of Data Reviewed Labs: ordered. Radiology: ordered.  Risk Prescription drug management.        Final diagnoses:  Perforated appendix    ED Discharge Orders     None          Odell Balls, PA-C 03/16/24 1743    Ruthe Juliene, DO 03/16/24 1953  "

## 2024-03-16 NOTE — ED Notes (Signed)
 Thomas with cl called for transport to [place], pt not posted yet for or per [facility].

## 2024-03-16 NOTE — ED Notes (Signed)
[facility] here to transport pt 

## 2024-03-16 NOTE — ED Triage Notes (Addendum)
 Starting Friday pain in Right abd. Tender upon palpation and non-radiating. +N/+V/-D. Dark urine. Ozempic  since 9/25.

## 2024-03-16 NOTE — ED Notes (Signed)
 Pt ambulated to bathroom and back to room with standby assist.

## 2024-03-16 NOTE — H&P (Signed)
 "  Andrea Pittman 1956/01/07  [id number].     HPI:  Andrea Pittman is a 69 yo female who presented to the ED with abdominal pain. Her symptoms began 3 days ago. The pain is in the right lower quadrant. She has also had nausea and vomiting. She denies fevers.  Labs are significant for a WBC of 33. A CT scan showed perforated appendicitis.  Previous abdominal surgeries include a cholecystectomy and hysterectomy. She does not take any blood thinners. She is on Ozempic  and her last dose was 8 days ago.   ROS: Review of Systems  Constitutional:  Negative for chills and fever.  Respiratory:  Negative for shortness of breath.   Gastrointestinal:  Positive for abdominal pain, nausea and vomiting. Negative for diarrhea.    Family History  Problem Relation Age of Onset   Cancer Mother     Past Medical History:  Diagnosis Date   Anxiety    Depression    Diabetes mellitus without complication (HCC)    GERD (gastroesophageal reflux disease)     Past Surgical History:  Procedure Laterality Date   ABDOMINAL HYSTERECTOMY     BILATERAL CARPAL TUNNEL RELEASE     bladder tack     CHOLECYSTECTOMY N/A 09/23/2020   Procedure: LAPAROSCOPIC CHOLECYSTECTOMY;  Surgeon: Belinda Cough, MD;  Location: [facility] OR;  Service: General;  Laterality: N/A;   ERCP N/A 09/22/2020   Procedure: ENDOSCOPIC RETROGRADE CHOLANGIOPANCREATOGRAPHY (ERCP);  Surgeon: [name], MD;  Location: [facility] ENDOSCOPY;  Service: Endoscopy;  Laterality: N/A;   REMOVAL OF STONES  09/22/2020   Procedure: REMOVAL OF STONES;  Surgeon: [name], MD;  Location: [facility] ENDOSCOPY;  Service: Endoscopy;;   SPHINCTEROTOMY  09/22/2020   Procedure: ANNETT;  Surgeon: [name], MD;  Location: [facility] ENDOSCOPY;  Service: Endoscopy;;    Social History:  reports that she has never smoked. She has never used smokeless tobacco. She reports that she does not drink alcohol and does not use drugs.  Allergies: Allergies[1]  (Not in a hospital  admission)    Physical Exam: Blood pressure 125/72, pulse 100, temperature 98.9 F (37.2 C), temperature source Oral, resp. rate 20, height 5' 4 (1.626 m), weight 85.3 kg, SpO2 97%. General: resting comfortably, appears stated age, no apparent distress Neurological: alert and oriented, no focal deficits HEENT: normocephalic, atraumatic CV: regular rate and rhythm Respiratory: normal work of breathing on room air Abdomen: soft, nondistended, focally tender to deep palpation in the RLQ, no rebound tenderness or guarding. Extremities: warm and well-perfused, no deformities, moving all extremities spontaneously Psychiatric: normal mood and affect Skin: warm and dry, no jaundice   Results for orders placed or performed during the hospital encounter of 03/16/24 (from the past 48 hours)  Lipase, blood     Status: None   Collection Time: 03/16/24  3:13 PM  Result Value Ref Range   Lipase 20 11 - 51 U/L    Comment: Performed at [facility], [address] [place], [place] [address]  Comprehensive metabolic panel     Status: Abnormal   Collection Time: 03/16/24  3:13 PM  Result Value Ref Range   Sodium 132 (L) 135 - 145 mmol/L   Potassium 3.9 3.5 - 5.1 mmol/L   Chloride 95 (L) 98 - 111 mmol/L   CO2 24 22 - 32 mmol/L   Glucose, Bld 174 (H) 70 - 99 mg/dL    Comment: Glucose reference range applies only to samples taken after fasting for at least 8 hours.  BUN 22 8 - 23 mg/dL   Creatinine, Ser 8.84 (H) 0.44 - 1.00 mg/dL   Calcium 9.3 8.9 - 89.6 mg/dL   Total Protein 7.8 6.5 - 8.1 g/dL   Albumin 3.6 3.5 - 5.0 g/dL   AST 18 15 - 41 U/L   ALT 14 0 - 44 U/L   Alkaline Phosphatase 106 38 - 126 U/L   Total Bilirubin 1.3 (H) 0.0 - 1.2 mg/dL   GFR, Estimated 51 (L) >60 mL/min    Comment: (NOTE) Calculated using the CKD-EPI Creatinine Equation (2021)    Anion gap 13 5 - 15    Comment: Performed at [facility], [address], [place], [place]  [address]  CBC     Status: Abnormal   Collection Time: 03/16/24  3:13 PM  Result Value Ref Range   WBC 33.3 (H) 4.0 - 10.5 K/uL   RBC 4.78 3.87 - 5.11 MIL/uL   Hemoglobin 14.4 12.0 - 15.0 g/dL   HCT 57.4 63.9 - 53.9 %   MCV 88.9 80.0 - 100.0 fL   MCH 30.1 26.0 - 34.0 pg   MCHC 33.9 30.0 - 36.0 g/dL   RDW 86.3 88.4 - 84.4 %   Platelets 373 150 - 400 K/uL   nRBC 0.0 0.0 - 0.2 %    Comment: Performed at [facility], [address] [place], [place] [address]  Urinalysis, Routine w reflex microscopic -Urine, Clean Catch     Status: Abnormal   Collection Time: 03/16/24  3:13 PM  Result Value Ref Range   Color, Urine YELLOW YELLOW   APPearance HAZY (A) CLEAR   Specific Gravity, Urine 1.030 1.005 - 1.030   pH 6.0 5.0 - 8.0   Glucose, UA NEGATIVE NEGATIVE mg/dL   Hgb urine dipstick NEGATIVE NEGATIVE   Bilirubin Urine NEGATIVE NEGATIVE   Ketones, ur NEGATIVE NEGATIVE mg/dL   Protein, ur 899 (A) NEGATIVE mg/dL   Nitrite NEGATIVE NEGATIVE   Leukocytes,Ua MODERATE (A) NEGATIVE   RBC / HPF 11-20 0 - 5 RBC/hpf   WBC, UA 21-50 0 - 5 WBC/hpf   Bacteria, UA RARE (A) NONE SEEN   Squamous Epithelial / HPF 21-50 0 - 5 /HPF   Mucus PRESENT    Hyaline Casts, UA PRESENT     Comment: Performed at [facility], [address], [place], [place] [address]   CT ABDOMEN PELVIS W CONTRAST Result Date: 03/16/2024 CLINICAL DATA:  Abdominal pain, acute, nonlocalized. EXAM: CT ABDOMEN AND PELVIS WITH CONTRAST TECHNIQUE: Multidetector CT imaging of the abdomen and pelvis was performed using the standard protocol following bolus administration of intravenous contrast. RADIATION DOSE REDUCTION: This exam was performed according to the departmental dose-optimization program which includes automated exposure control, adjustment of the mA and/or kV according to patient size and/or use of iterative reconstruction technique. CONTRAST:  OMNIPAQUE  IOHEXOL  300 MG/ML  SOLN  COMPARISON:  08/26/2020 FINDINGS: Lower chest: Lung bases are clear. Hepatobiliary: Normal appearance of the liver. Main portal venous system is patent. Cholecystectomy. Small amount of gas within the biliary system and likely related to previous ERCP. No significant biliary dilatation. Pancreas: Small fat foci in the head and uncinate process. No evidence for pancreatic duct dilatation or pancreatic inflammation. Spleen: Normal in size without focal abnormality. Adrenals/Urinary Tract: Normal adrenal glands. Normal appearance of both kidneys without hydronephrosis. Normal urinary bladder. 8 mm hypodensity in the left kidney upper pole on image 30/2. This is better seen on the coronal images and  the Hounsfield units measure 26 on image 37/4. This likely represents a left renal cyst. No suspicious renal lesion. Stomach/Bowel: Normal stomach. Large amount of inflammation with small pockets of free air in the right lower quadrant. Diffuse wall thickening in the terminal ileum. Inflammatory process in right lower quadrant is most compatible with a perforated appendicitis. Base of the appendix is probably on image 69/2 and difficult to exclude an appendicolith in this area. No evidence for bowel dilatation or obstruction. Vascular/Lymphatic: Mild atherosclerotic disease in the abdominal aorta without aneurysm. Main visceral arteries are patent. No significant lymph node enlargement in the abdomen or pelvis. Reproductive: Status post hysterectomy. No adnexal masses. Other: Large amount of inflammation in the right lower quadrant centered around the perforated appendicitis. Small collection of fluid in the right lower quadrant on image 71/2 but not clear if this is within the cecum or extraluminal. There is not a well-defined or discrete abscess collection in the right lower quadrant at this time. Small umbilical hernia containing fat. Musculoskeletal: Chronic sclerotic focus involving the left pubic bone. No acute bone  abnormality. IMPRESSION: 1. Perforated appendicitis. Large amount of inflammation in the right lower quadrant centered around the perforated appendix. Multiple small pockets of extraluminal gas. No well-defined abscess collection at this time. 2. Diffuse wall thickening in the terminal ileum is likely secondary to the perforated appendicitis. 3. Aortic Atherosclerosis (ICD10-I70.0). These results were called by telephone at the time of interpretation on 03/16/2024 at 5:17 pm to provider ADAM CURATOLO , who verbally acknowledged these results. Electronically Signed   By: Juliene Balder M.D.   On: 03/16/2024 17:19      Assessment/Plan 69 yo female with acute perforated appendicitis. I personally reviewed her labs, imaging and notes. Her CT scan shows significant inflammation with small foci of air in the RLQ, consistent with perforated appendicitis. There is no drainable abscess. Will trial nonoperative management with IV antibiotics. I discussed with the patient that if she clinically worsens, she may need an appendectomy. - Pain and nausea control - Clear liquid diet - Hold home Ozempic  FEN - CLD, maintenance IV fluids VTE - Lovenox , SCDs ID - Zosyn  Admit  to inpatient, med-surg floor  Leonor Dawn, MD [facility] Surgery General, Hepatobiliary and Pancreatic Surgery 03/16/2024 7:29 PM      [1] No Known Allergies  "

## 2024-03-17 LAB — CBC
HCT: 36.4 % (ref 36.0–46.0)
Hemoglobin: 12.1 g/dL (ref 12.0–15.0)
MCH: 30.6 pg (ref 26.0–34.0)
MCHC: 33.2 g/dL (ref 30.0–36.0)
MCV: 92.2 fL (ref 80.0–100.0)
Platelets: 298 K/uL (ref 150–400)
RBC: 3.95 MIL/uL (ref 3.87–5.11)
RDW: 13.3 % (ref 11.5–15.5)
WBC: 28 K/uL — ABNORMAL HIGH (ref 4.0–10.5)
nRBC: 0 % (ref 0.0–0.2)

## 2024-03-17 LAB — BASIC METABOLIC PANEL WITH GFR
Anion gap: 9 (ref 5–15)
BUN: 22 mg/dL (ref 8–23)
CO2: 26 mmol/L (ref 22–32)
Calcium: 8.2 mg/dL — ABNORMAL LOW (ref 8.9–10.3)
Chloride: 99 mmol/L (ref 98–111)
Creatinine, Ser: 1.15 mg/dL — ABNORMAL HIGH (ref 0.44–1.00)
GFR, Estimated: 51 mL/min — ABNORMAL LOW
Glucose, Bld: 135 mg/dL — ABNORMAL HIGH (ref 70–99)
Potassium: 3.7 mmol/L (ref 3.5–5.1)
Sodium: 134 mmol/L — ABNORMAL LOW (ref 135–145)

## 2024-03-17 MED ORDER — MENTHOL 3 MG MT LOZG
1.0000 | LOZENGE | OROMUCOSAL | Status: DC | PRN
  Administered 2024-04-06: 3 mg via ORAL
  Filled 2024-03-17: qty 9

## 2024-03-17 MED ORDER — PHENOL 1.4 % MT LIQD
1.0000 | OROMUCOSAL | Status: DC | PRN
  Administered 2024-03-26 – 2024-03-31 (×3): 1 via OROMUCOSAL
  Filled 2024-03-17 (×2): qty 177

## 2024-03-17 NOTE — Progress Notes (Signed)
 "  Progress Note     Subjective: Andrea present.  Patient denies pain at rest. She reports right lower abdominal pain with movement. Having bowel function and flatulence. Tolerating liquids. Denies nausea and vomiting.   ROS  All negative with the exception of above.  Objective: Vital signs in last 24 hours: Temp:  [97.7 F (36.5 C)-99.3 F (37.4 C)] 97.9 F (36.6 C) (01/13 0828) Pulse Rate:  [82-119] 101 (01/13 0828) Resp:  [13-20] 16 (01/13 0828) BP: (95-126)/(50-73) 95/58 (01/13 0828) SpO2:  [94 %-100 %] 100 % (01/13 0828) Weight:  [84.8 kg-85.3 kg] 85.3 kg (01/12 1905)    Intake/Output from previous day: 01/12 0701 - 01/13 0700 In: 240 [P.O.:240] Out: -  Intake/Output this shift: Total I/O In: 1591.2 [P.O.:500; I.V.:1091.2] Out: -   PE: General: Pleasant female who is laying in bed in NAD. HEENT: Head is normocephalic, atraumatic. Sclera are noninjected. EOMI. Ears and nose without any masses or lesions. Mouth is pink and moist. Heart: HR mildly elevated.  Lungs: Respiratory effort nonlabored on room air.  Abd: Soft, ND. Tenderness to palpation of right lower quadrant. No rebound tenderness or guarding.  MS: Able to move extremities.  Skin: Warm and dry.  Psych: A&Ox3 with an appropriate affect.    Lab Results:  Recent Labs    03/16/24 1513 03/17/24 0057  WBC 33.3* 28.0*  HGB 14.4 12.1  HCT 42.5 36.4  PLT 373 298   BMET Recent Labs    03/16/24 1513 03/17/24 0057  NA 132* 134*  K 3.9 3.7  CL 95* 99  CO2 24 26  GLUCOSE 174* 135*  BUN 22 22  CREATININE 1.15* 1.15*  CALCIUM 9.3 8.2*   PT/INR No results for input(s): LABPROT, INR in the last 72 hours. CMP     Component Value Date/Time   NA 134 (L) 03/17/2024 0057   NA 138 01/24/2024 0949   K 3.7 03/17/2024 0057   CL 99 03/17/2024 0057   CO2 26 03/17/2024 0057   GLUCOSE 135 (H) 03/17/2024 0057   BUN 22 03/17/2024 0057   BUN 16 01/24/2024 0949   CREATININE 1.15 (H) 03/17/2024 0057    CALCIUM 8.2 (L) 03/17/2024 0057   PROT 7.8 03/16/2024 1513   PROT 7.3 01/24/2024 0949   ALBUMIN 3.6 03/16/2024 1513   ALBUMIN 3.9 01/24/2024 0949   AST 18 03/16/2024 1513   ALT 14 03/16/2024 1513   ALKPHOS 106 03/16/2024 1513   BILITOT 1.3 (H) 03/16/2024 1513   BILITOT 0.6 01/24/2024 0949   GFRNONAA 51 (L) 03/17/2024 0057   GFRAA 86 05/28/2019 1217   Lipase     Component Value Date/Time   LIPASE 20 03/16/2024 1513       Studies/Results: CT ABDOMEN PELVIS W CONTRAST Result Date: 03/16/2024 CLINICAL DATA:  Abdominal pain, acute, nonlocalized. EXAM: CT ABDOMEN AND PELVIS WITH CONTRAST TECHNIQUE: Multidetector CT imaging of the abdomen and pelvis was performed using the standard protocol following bolus administration of intravenous contrast. RADIATION DOSE REDUCTION: This exam was performed according to the departmental dose-optimization program which includes automated exposure control, adjustment of the mA and/or kV according to patient size and/or use of iterative reconstruction technique. CONTRAST:  OMNIPAQUE  IOHEXOL  300 MG/ML  SOLN COMPARISON:  08/26/2020 FINDINGS: Lower chest: Lung bases are clear. Hepatobiliary: Normal appearance of the liver. Main portal venous system is patent. Cholecystectomy. Small amount of gas within the biliary system and likely related to previous ERCP. No significant biliary dilatation. Pancreas: Small fat foci  in the head and uncinate process. No evidence for pancreatic duct dilatation or pancreatic inflammation. Spleen: Normal in size without focal abnormality. Adrenals/Urinary Tract: Normal adrenal glands. Normal appearance of both kidneys without hydronephrosis. Normal urinary bladder. 8 mm hypodensity in the left kidney upper pole on image 30/2. This is better seen on the coronal images and the Hounsfield units measure 26 on image 37/4. This likely represents a left renal cyst. No suspicious renal lesion. Stomach/Bowel: Normal stomach. Large amount of  inflammation with small pockets of free air in the right lower quadrant. Diffuse wall thickening in the terminal ileum. Inflammatory process in right lower quadrant is most compatible with a perforated appendicitis. Base of the appendix is probably on image 69/2 and difficult to exclude an appendicolith in this area. No evidence for bowel dilatation or obstruction. Vascular/Lymphatic: Mild atherosclerotic disease in the abdominal aorta without aneurysm. Main visceral arteries are patent. No significant lymph node enlargement in the abdomen or pelvis. Reproductive: Status post hysterectomy. No adnexal masses. Other: Large amount of inflammation in the right lower quadrant centered around the perforated appendicitis. Small collection of fluid in the right lower quadrant on image 71/2 but not clear if this is within the cecum or extraluminal. There is not a well-defined or discrete abscess collection in the right lower quadrant at this time. Small umbilical hernia containing fat. Musculoskeletal: Chronic sclerotic focus involving the left pubic bone. No acute bone abnormality. IMPRESSION: 1. Perforated appendicitis. Large amount of inflammation in the right lower quadrant centered around the perforated appendix. Multiple small pockets of extraluminal gas. No well-defined abscess collection at this time. 2. Diffuse wall thickening in the terminal ileum is likely secondary to the perforated appendicitis. 3. Aortic Atherosclerosis (ICD10-I70.0). These results were called by telephone at the time of interpretation on 03/16/2024 at 5:17 pm to provider ADAM CURATOLO , who verbally acknowledged these results. Electronically Signed   By: Andrea Pittman M.D.   On: 03/16/2024 17:19    Anti-infectives: Anti-infectives (From admission, onward)    Start     Dose/Rate Route Frequency Ordered Stop   03/17/24 0200  piperacillin -tazobactam (ZOSYN ) IVPB 3.375 g        3.375 g 12.5 mL/hr over 240 Minutes Intravenous Every 8 hours  03/16/24 1928 03/24/24 0559   03/16/24 1730  piperacillin -tazobactam (ZOSYN ) IVPB 3.375 g        3.375 g 100 mL/hr over 30 Minutes Intravenous  Once 03/16/24 1719 03/16/24 1809        Assessment/Plan 69 yo female with acute perforated appendicitis - CT scan shows significant inflammation with small foci of air in the RLQ, consistent with perforated appendicitis. There is no drainable abscess.   - Afebrile. Mild tachycardia. - WBC 28.0 from 33.3 - Cr 1.15; LR continuous - Exam with RLQ tenderness - Will plan to continue with IV antibiotics. - Tolerating CLD. Will continue for now.  - Continue to hold Ozempic .  FEN - CLD, maintenance IV fluids VTE - Lovenox , SCDs ID - Zosyn    LOS: 0 days   I reviewed specialist notes, consulting provider notes, EDP contes, nursing notes, last 24 h vitals and pain scores, last 48 h intake and output, last 24 h labs and trends, and last 24 h imaging results.  This care required moderate level of medical decision making.   Marjorie Carlyon Favre, [facility] 03/17/2024, 10:18 AM Please see Amion for pager number during day hours 7:00am-4:30pm  "

## 2024-03-17 NOTE — TOC Initial Note (Signed)
 Transition of Care [facility]) - Initial/Assessment Note    Patient Details  Name: Andrea Pittman MRN: [id number] Date of Birth: 06-07-1955  [facility]) CM/SW Contact:    Marval Gell, RN Phone Number: 03/17/2024, 2:49 PM  Clinical Narrative:                  Patient admitted from home for abd pain/ perforated appendicitis. Non surgical management at this time.  ICM will continue to follow   Expected Discharge Plan: Home/Self Care Barriers to Discharge: Continued Medical Work up   Patient Goals and CMS Choice Patient states their goals for this hospitalization and ongoing recovery are:: to go home          Expected Discharge Plan and Services       Living arrangements for the past 2 months: Single Family Home                                      Prior Living Arrangements/Services Living arrangements for the past 2 months: Single Family Home Lives with:: Spouse                   Activities of Daily Living      Permission Sought/Granted                  Emotional Assessment              Admission diagnosis:  Perforated appendix [K35.32] Perforated appendicitis [K35.32] Patient Active Problem List   Diagnosis Date Noted   Perforated appendicitis 03/16/2024   GAD (generalized anxiety disorder) 01/24/2024   Type 2 diabetes mellitus with other specified complication (HCC) 07/15/2023   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 [place] [address] Phone: [phone] Fax: [phone]     Social Drivers of Health (SDOH) Social History: SDOH Screenings   Food Insecurity: No Food Insecurity  (01/23/2024)  Housing: Low Risk (01/23/2024)  Transportation Needs: No Transportation Needs (01/23/2024)  Utilities: Not At Risk (09/16/2023)  Alcohol Screen: Low Risk (09/16/2023)  Depression (PHQ2-9): Medium Risk (01/24/2024)  Financial Resource Strain: Low Risk (01/23/2024)  Physical Activity: Unknown (01/23/2024)  Social Connections: Socially Integrated (01/23/2024)  Stress: No Stress Concern Present (01/23/2024)  Tobacco Use: Low Risk (03/16/2024)  Health Literacy: Adequate Health Literacy (09/16/2023)   SDOH Interventions:     Readmission Risk Interventions     No data to display

## 2024-03-17 NOTE — ED Notes (Signed)
 Floor notified patient coming to hall

## 2024-03-18 MED ORDER — LACTATED RINGERS IV BOLUS
1000.0000 mL | Freq: Once | INTRAVENOUS | Status: AC
  Administered 2024-03-18: 1000 mL via INTRAVENOUS

## 2024-03-18 NOTE — Plan of Care (Signed)
 Patient calm and cooperative A&O X4, family at bedside during visitation hours. PIVs maintained. Patient ambulated stand by assist. Patient left with call bell in reach an bed in lowest position.  Problem: Education: Goal: Knowledge of General Education information will improve Description: Including pain rating scale, medication(s)/side effects and non-pharmacologic comfort measures Outcome: Progressing   Problem: Health Behavior/Discharge Planning: Goal: Ability to manage health-related needs will improve Outcome: Progressing   Problem: Clinical Measurements: Goal: Ability to maintain clinical measurements within normal limits will improve Outcome: Progressing   Problem: Activity: Goal: Risk for activity intolerance will decrease Outcome: Progressing   Problem: Nutrition: Goal: Adequate nutrition will be maintained Outcome: Progressing   Problem: Coping: Goal: Level of anxiety will decrease Outcome: Progressing   Problem: Safety: Goal: Ability to remain free from injury will improve Outcome: Progressing   Problem: Skin Integrity: Goal: Risk for impaired skin integrity will decrease Outcome: Progressing

## 2024-03-18 NOTE — Progress Notes (Signed)
 "  Progress Note     Subjective: Andrea Pittman, HR 100s. Cr 1.15 from 1. WBC 28 from 33.   On exam, patient is resting comfortably in bed. She reports that the RLQ pain is persistent and may be worse.   ROS  All negative with the exception of above.  Objective: Vital signs in last 24 hours: Temp:  [98.6 F (37 C)-99.9 F (37.7 C)] 99.1 F (37.3 C) (01/14 0730) Pulse Rate:  [92-104] 93 (01/14 0730) Resp:  [16-18] 16 (01/14 0730) BP: (98-130)/(53-61) 130/61 (01/14 0730) SpO2:  [93 %-100 %] 93 % (01/14 0730) Last BM Date : 03/17/24  Intake/Output from previous day: 01/13 0701 - 01/14 0700 In: 2948.9 [P.O.:500; I.V.:2298.6; IV Piggyback:150.3] Out: -  Intake/Output this shift: No intake/output data recorded.  PE: General: Pleasant female who is laying in bed in NAD. HEENT: Head is normocephalic, atraumatic. Sclera are noninjected. EOMI. Ears and nose without any masses or lesions. Mouth is pink and moist. Heart: HR mildly elevated.  Lungs: Respiratory effort nonlabored on room air.  Abd: Soft, ND. TTP in the RLQ and RUQ. No rebound/guarding, no peritoneal signs MS: Able to move extremities.  Skin: Warm and dry.  Psych: A&Ox3 with an appropriate affect.    Lab Results:  Recent Labs    03/16/24 1513 03/17/24 0057  WBC 33.3* 28.0*  HGB 14.4 12.1  HCT 42.5 36.4  PLT 373 298   BMET Recent Labs    03/16/24 1513 03/17/24 0057  NA 132* 134*  K 3.9 3.7  CL 95* 99  CO2 24 26  GLUCOSE 174* 135*  BUN 22 22  CREATININE 1.15* 1.15*  CALCIUM 9.3 8.2*   PT/INR No results for input(s): LABPROT, INR in the last 72 hours. CMP     Component Value Date/Time   NA 134 (L) 03/17/2024 0057   NA 138 01/24/2024 0949   K 3.7 03/17/2024 0057   CL 99 03/17/2024 0057   CO2 26 03/17/2024 0057   GLUCOSE 135 (H) 03/17/2024 0057   BUN 22 03/17/2024 0057   BUN 16 01/24/2024 0949   CREATININE 1.15 (H) 03/17/2024 0057   CALCIUM 8.2 (L) 03/17/2024 0057   PROT 7.8 03/16/2024 1513    PROT 7.3 01/24/2024 0949   ALBUMIN 3.6 03/16/2024 1513   ALBUMIN 3.9 01/24/2024 0949   AST 18 03/16/2024 1513   ALT 14 03/16/2024 1513   ALKPHOS 106 03/16/2024 1513   BILITOT 1.3 (H) 03/16/2024 1513   BILITOT 0.6 01/24/2024 0949   GFRNONAA 51 (L) 03/17/2024 0057   GFRAA 86 05/28/2019 1217   Lipase     Component Value Date/Time   LIPASE 20 03/16/2024 1513       Studies/Results: CT ABDOMEN PELVIS W CONTRAST Result Date: 03/16/2024 CLINICAL DATA:  Abdominal pain, acute, nonlocalized. EXAM: CT ABDOMEN AND PELVIS WITH CONTRAST TECHNIQUE: Multidetector CT imaging of the abdomen and pelvis was performed using the standard protocol following bolus administration of intravenous contrast. RADIATION DOSE REDUCTION: This exam was performed according to the departmental dose-optimization program which includes automated exposure control, adjustment of the mA and/or kV according to patient size and/or use of iterative reconstruction technique. CONTRAST:  OMNIPAQUE  IOHEXOL  300 MG/ML  SOLN COMPARISON:  08/26/2020 FINDINGS: Lower chest: Lung bases are clear. Hepatobiliary: Normal appearance of the liver. Main portal venous system is patent. Cholecystectomy. Small amount of gas within the biliary system and likely related to previous ERCP. No significant biliary dilatation. Pancreas: Small fat foci in the head and  uncinate process. No evidence for pancreatic duct dilatation or pancreatic inflammation. Spleen: Normal in size without focal abnormality. Adrenals/Urinary Tract: Normal adrenal glands. Normal appearance of both kidneys without hydronephrosis. Normal urinary bladder. 8 mm hypodensity in the left kidney upper pole on image 30/2. This is better seen on the coronal images and the Hounsfield units measure 26 on image 37/4. This likely represents a left renal cyst. No suspicious renal lesion. Stomach/Bowel: Normal stomach. Large amount of inflammation with small pockets of free air in the right lower  quadrant. Diffuse wall thickening in the terminal ileum. Inflammatory process in right lower quadrant is most compatible with a perforated appendicitis. Base of the appendix is probably on image 69/2 and difficult to exclude an appendicolith in this area. No evidence for bowel dilatation or obstruction. Vascular/Lymphatic: Mild atherosclerotic disease in the abdominal aorta without aneurysm. Main visceral arteries are patent. No significant lymph node enlargement in the abdomen or pelvis. Reproductive: Status post hysterectomy. No adnexal masses. Other: Large amount of inflammation in the right lower quadrant centered around the perforated appendicitis. Small collection of fluid in the right lower quadrant on image 71/2 but not clear if this is within the cecum or extraluminal. There is not a well-defined or discrete abscess collection in the right lower quadrant at this time. Small umbilical hernia containing fat. Musculoskeletal: Chronic sclerotic focus involving the left pubic bone. No acute bone abnormality. IMPRESSION: 1. Perforated appendicitis. Large amount of inflammation in the right lower quadrant centered around the perforated appendix. Multiple small pockets of extraluminal gas. No well-defined abscess collection at this time. 2. Diffuse wall thickening in the terminal ileum is likely secondary to the perforated appendicitis. 3. Aortic Atherosclerosis (ICD10-I70.0). These results were called by telephone at the time of interpretation on 03/16/2024 at 5:17 pm to provider Andrea Pittman , who verbally acknowledged these results. Electronically Signed   By: Andrea Pittman M.D.   On: 03/16/2024 17:19    Anti-infectives: Anti-infectives (From admission, onward)    Start     Dose/Rate Route Frequency Ordered Stop   03/17/24 0200  piperacillin -tazobactam (ZOSYN ) IVPB 3.375 g        3.375 g 12.5 mL/hr over 240 Minutes Intravenous Every 8 hours 03/16/24 1928 03/24/24 0559   03/16/24 1730   piperacillin -tazobactam (ZOSYN ) IVPB 3.375 g        3.375 g 100 mL/hr over 30 Minutes Intravenous  Once 03/16/24 1719 03/16/24 1809        Assessment/Plan 69 yo female with acute perforated appendicitis - WBC down slightly and she remains Andrea Pittman but with persistent abdominal pain - Tentative plan for repeat imaging tomorrow but if she feels worse this afternoon then we may repeat imaging soon - Continue IV abx - Increase MIVF to 125. 1L bolus this AM  FEN - CLD, MIVF VTE - Lovenox , SCDs ID - Zosyn    LOS: 0 days   I reviewed specialist notes, consulting provider notes, EDP contes, nursing notes, last 24 h vitals and pain scores, last 48 h intake and output, last 24 h labs and trends, and last 24 h imaging results.  This care required moderate level of medical decision making.   Andrea DELENA Idler, MD  [facility] 03/18/2024, 8:37 AM Please see Amion for pager number during day hours 7:00am-4:30pm  "

## 2024-03-18 NOTE — Plan of Care (Signed)

## 2024-03-19 ENCOUNTER — Inpatient Hospital Stay (HOSPITAL_COMMUNITY)

## 2024-03-19 LAB — CBC WITH DIFFERENTIAL/PLATELET
Abs Immature Granulocytes: 0.33 K/uL — ABNORMAL HIGH (ref 0.00–0.07)
Basophils Absolute: 0.1 K/uL (ref 0.0–0.1)
Basophils Relative: 1 %
Eosinophils Absolute: 0.5 K/uL (ref 0.0–0.5)
Eosinophils Relative: 3 %
HCT: 33.6 % — ABNORMAL LOW (ref 36.0–46.0)
Hemoglobin: 11.5 g/dL — ABNORMAL LOW (ref 12.0–15.0)
Immature Granulocytes: 2 %
Lymphocytes Relative: 14 %
Lymphs Abs: 2.8 K/uL (ref 0.7–4.0)
MCH: 30.9 pg (ref 26.0–34.0)
MCHC: 34.2 g/dL (ref 30.0–36.0)
MCV: 90.3 fL (ref 80.0–100.0)
Monocytes Absolute: 1.4 K/uL — ABNORMAL HIGH (ref 0.1–1.0)
Monocytes Relative: 7 %
Neutro Abs: 15.4 K/uL — ABNORMAL HIGH (ref 1.7–7.7)
Neutrophils Relative %: 73 %
Platelets: 320 K/uL (ref 150–400)
RBC: 3.72 MIL/uL — ABNORMAL LOW (ref 3.87–5.11)
RDW: 13.2 % (ref 11.5–15.5)
Smear Review: NORMAL
WBC: 20.6 K/uL — ABNORMAL HIGH (ref 4.0–10.5)
nRBC: 0 % (ref 0.0–0.2)

## 2024-03-19 LAB — BASIC METABOLIC PANEL WITH GFR
Anion gap: 9 (ref 5–15)
BUN: 6 mg/dL — ABNORMAL LOW (ref 8–23)
CO2: 28 mmol/L (ref 22–32)
Calcium: 8.2 mg/dL — ABNORMAL LOW (ref 8.9–10.3)
Chloride: 99 mmol/L (ref 98–111)
Creatinine, Ser: 0.69 mg/dL (ref 0.44–1.00)
GFR, Estimated: >60 mL/min
Glucose, Bld: 85 mg/dL (ref 70–99)
Potassium: 3.1 mmol/L — ABNORMAL LOW (ref 3.5–5.1)
Sodium: 136 mmol/L (ref 135–145)

## 2024-03-19 MED ORDER — ENOXAPARIN SODIUM 40 MG/0.4ML IJ SOSY
40.0000 mg | PREFILLED_SYRINGE | INTRAMUSCULAR | Status: DC
  Administered 2024-03-20 – 2024-03-23 (×4): 40 mg via SUBCUTANEOUS
  Filled 2024-03-19 (×4): qty 0.4

## 2024-03-19 MED ORDER — IOHEXOL 350 MG/ML SOLN
75.0000 mL | Freq: Once | INTRAVENOUS | Status: AC | PRN
  Administered 2024-03-19: 75 mL via INTRAVENOUS

## 2024-03-19 NOTE — Plan of Care (Signed)
 Patient calm and cooperative A&O X4> PIV maintained. Patient ambulated to bathroom stand by assist. Patient left with call bell in reach and bed in lowest position.  Problem: Education: Goal: Knowledge of General Education information will improve Description: Including pain rating scale, medication(s)/side effects and non-pharmacologic comfort measures Outcome: Progressing   Problem: Health Behavior/Discharge Planning: Goal: Ability to manage health-related needs will improve Outcome: Progressing   Problem: Clinical Measurements: Goal: Ability to maintain clinical measurements within normal limits will improve Outcome: Progressing   Problem: Activity: Goal: Risk for activity intolerance will decrease Outcome: Progressing   Problem: Nutrition: Goal: Adequate nutrition will be maintained Outcome: Progressing   Problem: Coping: Goal: Level of anxiety will decrease Outcome: Progressing   Problem: Pain Managment: Goal: General experience of comfort will improve and/or be controlled Outcome: Progressing   Problem: Safety: Goal: Ability to remain free from injury will improve Outcome: Progressing   Problem: Skin Integrity: Goal: Risk for impaired skin integrity will decrease Outcome: Progressing

## 2024-03-19 NOTE — Progress Notes (Signed)
 "  Progress Note     Subjective: Having continued discomfort of the RLQ. Denies worsening pain. Tolerating CLD. Denies n/v. Patient had [facility] Tuesday 1/13. No BM since then. Denies flatulence.   ROS  All negative with the exception of above.  Objective: Vital signs in last 24 hours: Temp:  [97.5 F (36.4 C)-98.6 F (37 C)] 98.6 F (37 C) (01/15 0717) Pulse Rate:  [75-92] 79 (01/15 0717) Resp:  [16-20] 16 (01/15 0717) BP: (99-121)/(56-66) 121/66 (01/15 0717) SpO2:  [93 %-97 %] 97 % (01/15 0717) Last BM Date : 03/17/24  Intake/Output from previous day: 01/14 0701 - 01/15 0700 In: 350 [P.O.:350] Out: -  Intake/Output this shift: No intake/output data recorded.  PE: General: Pleasant female who is laying in bed in NAD. HEENT: Head is normocephalic, atraumatic. Sclera are noninjected. EOMI. Ears and nose without any masses or lesions. Mouth is pink and moist. Heart: HR normal during encounter.  Lungs: Respiratory effort nonlabored on room air.  Abd: Soft, ND. TTP in the RLQ and RUQ. No rebound/guarding, no peritoneal signs. MS: Able to move extremities.  Skin: Warm and dry.  Psych: A&Ox3 with an appropriate affect.    Lab Results:  Recent Labs    03/17/24 0057 03/19/24 0750  WBC 28.0* 20.6*  HGB 12.1 11.5*  HCT 36.4 33.6*  PLT 298 320   BMET Recent Labs    03/17/24 0057 03/19/24 0750  NA 134* 136  K 3.7 3.1*  CL 99 99  CO2 26 28  GLUCOSE 135* 85  BUN 22 6*  CREATININE 1.15* 0.69  CALCIUM 8.2* 8.2*   PT/INR No results for input(s): LABPROT, INR in the last 72 hours. CMP     Component Value Date/Time   NA 136 03/19/2024 0750   NA 138 01/24/2024 0949   K 3.1 (L) 03/19/2024 0750   CL 99 03/19/2024 0750   CO2 28 03/19/2024 0750   GLUCOSE 85 03/19/2024 0750   BUN 6 (L) 03/19/2024 0750   BUN 16 01/24/2024 0949   CREATININE 0.69 03/19/2024 0750   CALCIUM 8.2 (L) 03/19/2024 0750   PROT 7.8 03/16/2024 1513   PROT 7.3 01/24/2024 0949   ALBUMIN 3.6  03/16/2024 1513   ALBUMIN 3.9 01/24/2024 0949   AST 18 03/16/2024 1513   ALT 14 03/16/2024 1513   ALKPHOS 106 03/16/2024 1513   BILITOT 1.3 (H) 03/16/2024 1513   BILITOT 0.6 01/24/2024 0949   GFRNONAA >60 03/19/2024 0750   GFRAA 86 05/28/2019 1217   Lipase     Component Value Date/Time   LIPASE 20 03/16/2024 1513       Studies/Results: No results found.  Anti-infectives: Anti-infectives (From admission, onward)    Start     Dose/Rate Route Frequency Ordered Stop   03/17/24 0200  piperacillin -tazobactam (ZOSYN ) IVPB 3.375 g        3.375 g 12.5 mL/hr over 240 Minutes Intravenous Every 8 hours 03/16/24 1928 03/24/24 0559   03/16/24 1730  piperacillin -tazobactam (ZOSYN ) IVPB 3.375 g        3.375 g 100 mL/hr over 30 Minutes Intravenous  Once 03/16/24 1719 03/16/24 1809        Assessment/Plan 69 yo female with acute perforated appendicitis - WBC 20.6 from 28.0; HGB 11.5 from 12.1 - WBC down slightly and she remains AF this AM. Having persistent abdominal pain. - Will discuss with attending proceeding with repeat CT imaging. - Continue IV abx - Continue LR at 125 mL/hr   FEN - CLD,  MIVF VTE - Lovenox , SCDs ID - Zosyn    LOS: 1 day   I reviewed provider notes, nursing notes, last 24 h vitals and pain scores, last 48 h intake and output, last 24 h labs and trends, and last 24 h imaging results.  This care required moderate level of medical decision making.   Marjorie Carlyon Favre, [facility] 03/19/2024, 9:19 AM Please see Amion for pager number during day hours 7:00am-4:30pm  "

## 2024-03-19 NOTE — Plan of Care (Signed)

## 2024-03-20 LAB — CBC
HCT: 33 % — ABNORMAL LOW (ref 36.0–46.0)
Hemoglobin: 11.4 g/dL — ABNORMAL LOW (ref 12.0–15.0)
MCH: 30.6 pg (ref 26.0–34.0)
MCHC: 34.5 g/dL (ref 30.0–36.0)
MCV: 88.5 fL (ref 80.0–100.0)
Platelets: 372 K/uL (ref 150–400)
RBC: 3.73 MIL/uL — ABNORMAL LOW (ref 3.87–5.11)
RDW: 13.4 % (ref 11.5–15.5)
WBC: 19 K/uL — ABNORMAL HIGH (ref 4.0–10.5)
nRBC: 0 % (ref 0.0–0.2)

## 2024-03-20 LAB — BASIC METABOLIC PANEL WITH GFR
Anion gap: 8 (ref 5–15)
BUN: <5 mg/dL — ABNORMAL LOW (ref 8–23)
CO2: 32 mmol/L (ref 22–32)
Calcium: 8.5 mg/dL — ABNORMAL LOW (ref 8.9–10.3)
Chloride: 96 mmol/L — ABNORMAL LOW (ref 98–111)
Creatinine, Ser: 0.69 mg/dL (ref 0.44–1.00)
GFR, Estimated: >60 mL/min
Glucose, Bld: 113 mg/dL — ABNORMAL HIGH (ref 70–99)
Potassium: 3 mmol/L — ABNORMAL LOW (ref 3.5–5.1)
Sodium: 136 mmol/L (ref 135–145)

## 2024-03-20 LAB — PROTIME-INR
INR: 1 (ref 0.8–1.2)
Prothrombin Time: 14.1 s (ref 11.4–15.2)

## 2024-03-20 MED ORDER — ACETAMINOPHEN 500 MG PO TABS
1000.0000 mg | ORAL_TABLET | Freq: Four times a day (QID) | ORAL | Status: AC
  Administered 2024-03-20 – 2024-03-26 (×15): 1000 mg via ORAL
  Filled 2024-03-20 (×17): qty 2

## 2024-03-20 MED ORDER — OXYCODONE HCL 5 MG PO TABS
5.0000 mg | ORAL_TABLET | ORAL | Status: DC | PRN
  Administered 2024-03-24 – 2024-03-25 (×4): 5 mg via ORAL
  Filled 2024-03-20 (×4): qty 1

## 2024-03-20 NOTE — Progress Notes (Signed)
 IR Brief Progress Note:  Received IR rad eval request regarding perforated appendicitis with abscess. Case reviewed by Dr. Jenna who does not recommend IR intervention at this time. [name], MD with [facility] surgery team notified. Please re-consult IR if needed.    Andrea Muff B Astria Jordahl NP 03/20/2024 8:27 AM

## 2024-03-20 NOTE — Progress Notes (Signed)
 "  Progress Note     Subjective: CT shows an abscess. She reports some nausea but no emesis. She remains AF and HDS. WBC 19 from 20  ROS  All negative with the exception of above.  Objective: Vital signs in last 24 hours: Temp:  [98.2 F (36.8 C)-98.5 F (36.9 C)] 98.5 F (36.9 C) (01/16 0342) Pulse Rate:  [69-87] 87 (01/16 0342) Resp:  [16-20] 20 (01/16 0342) BP: (110-126)/(55-61) 126/55 (01/16 0342) SpO2:  [91 %-97 %] 91 % (01/16 0342) Last BM Date : 03/17/24  Intake/Output from previous day: 01/15 0701 - 01/16 0700 In: 350 [P.O.:350] Out: -  Intake/Output this shift: No intake/output data recorded.  PE: General: Pleasant female who is laying in bed in NAD. HEENT: Head is normocephalic, atraumatic. Sclera are noninjected. EOMI. Ears and nose without any masses or lesions. Mouth is pink and moist. Heart: HR normal during encounter.  Lungs: Respiratory effort nonlabored on room air.  Abd: Soft, ND. TTP in the RLQ and RUQ. No rebound/guarding, no peritoneal signs. MS: Able to move extremities.  Skin: Warm and dry.  Psych: A&Ox3 with an appropriate affect.    Lab Results:  Recent Labs    03/19/24 0750 03/20/24 0508  WBC 20.6* 19.0*  HGB 11.5* 11.4*  HCT 33.6* 33.0*  PLT 320 372   BMET Recent Labs    03/19/24 0750 03/20/24 0508  NA 136 136  K 3.1* 3.0*  CL 99 96*  CO2 28 32  GLUCOSE 85 113*  BUN 6* <5*  CREATININE 0.69 0.69  CALCIUM 8.2* 8.5*   PT/INR Recent Labs    03/20/24 0508  LABPROT 14.1  INR 1.0   CMP     Component Value Date/Time   NA 136 03/20/2024 0508   NA 138 01/24/2024 0949   K 3.0 (L) 03/20/2024 0508   CL 96 (L) 03/20/2024 0508   CO2 32 03/20/2024 0508   GLUCOSE 113 (H) 03/20/2024 0508   BUN <5 (L) 03/20/2024 0508   BUN 16 01/24/2024 0949   CREATININE 0.69 03/20/2024 0508   CALCIUM 8.5 (L) 03/20/2024 0508   PROT 7.8 03/16/2024 1513   PROT 7.3 01/24/2024 0949   ALBUMIN 3.6 03/16/2024 1513   ALBUMIN 3.9 01/24/2024 0949    AST 18 03/16/2024 1513   ALT 14 03/16/2024 1513   ALKPHOS 106 03/16/2024 1513   BILITOT 1.3 (H) 03/16/2024 1513   BILITOT 0.6 01/24/2024 0949   GFRNONAA >60 03/20/2024 0508   GFRAA 86 05/28/2019 1217   Lipase     Component Value Date/Time   LIPASE 20 03/16/2024 1513       Studies/Results: CT ABDOMEN PELVIS W CONTRAST Result Date: 03/19/2024 EXAM: CT ABDOMEN AND PELVIS WITH CONTRAST 03/19/2024 10:33:01 PM TECHNIQUE: CT of the abdomen and pelvis was performed with the administration of 75 mL of iohexol  (OMNIPAQUE ) 350 MG/ML injection. Multiplanar reformatted images are provided for review. Automated exposure control, iterative reconstruction, and/or weight-based adjustment of the mA/kV was utilized to reduce the radiation dose to as low as reasonably achievable. COMPARISON: 03/16/2024 CLINICAL HISTORY: RLQ abdominal pain; Perforated appendicitis, follow up CT. FINDINGS: LOWER CHEST: New small right pleural effusion. LIVER: Moderate hepatic steatosis. GALLBLADDER AND BILE DUCTS: Status post cholecystectomy. Mild dilation of the biliary tree likely reflects post cholecystectomy change. Pneumobilia related to prior sphincterotomy. SPLEEN: No acute abnormality. PANCREAS: No acute abnormality. ADRENAL GLANDS: No acute abnormality. KIDNEYS, URETERS AND BLADDER: No stones in the kidneys or ureters. No hydronephrosis. No perinephric or periureteral  stranding. Urinary bladder is unremarkable. GI AND BOWEL: Stomach demonstrates no acute abnormality. There is progressive enlargement of the ill-defined region of fluid and gas adjacent to the cecum related to perforated appendicitis in keeping with a developing abscess now measuring 4.2 x 8.5 cm (series 63, image 2). Appendicolith again noted within the dilated and perforated residual appendix (series 67, image 2). There is progressive gas and fluid distention of the mid and distal small bowel to the point of the terminal ileum where this narrows secondary to  inflammatory change as it crosses adjacent to the developing abscess within the right lower quadrant (series 67, image 2), in keeping with a developing small bowel obstruction. Mild distal colonic diverticulosis. PERITONEUM AND RETROPERITONEUM: New trace ascites. No gross free intraperitoneal gas. VASCULATURE: Aorta is normal in caliber. LYMPH NODES: No lymphadenopathy. REPRODUCTIVE ORGANS: No acute abnormality. BONES AND SOFT TISSUES: No acute osseous abnormality. No focal soft tissue abnormality. IMPRESSION: 1. Progressive enlargement of the right lower quadrant poorly loculated fluid and gas collection adjacent to the cecum related to perforated appendicitis, now measuring 4.2 x 8.5 cm, compatible with a developing abscess, with appendicolith again noted within the residual ruptured appendix. 2. Progressive gas and fluid distention of the mid and distal small bowel to the level of the terminal ileum, compatible with a developing small bowel obstruction due to adjacent inflammatory change. 3. New small right pleural effusion and new trace ascites, without gross free intraperitoneal gas. 4. Moderate hepatic steatosis, status post cholecystectomy with mild biliary tree dilation and pneumobilia related to prior sphincterotomy, and mild distal colonic diverticulosis. Electronically signed by: Dorethia Molt MD 03/19/2024 10:46 PM EST RP Workstation: [id number]    Anti-infectives: Anti-infectives (From admission, onward)    Start     Dose/Rate Route Frequency Ordered Stop   03/17/24 0200  piperacillin -tazobactam (ZOSYN ) IVPB 3.375 g        3.375 g 12.5 mL/hr over 240 Minutes Intravenous Every 8 hours 03/16/24 1928 03/24/24 0559   03/16/24 1730  piperacillin -tazobactam (ZOSYN ) IVPB 3.375 g        3.375 g 100 mL/hr over 30 Minutes Intravenous  Once 03/16/24 1719 03/16/24 1809        Assessment/Plan 69 yo female with acute perforated appendicitis - IR consult for drain placement. We discussed that if  they are unable to do so or that if she fails to progress that she may still require surgery - NPO pending procedure - Continue IV abx   FEN - NPO for procedure, MIVF VTE - Lovenox , SCDs ID - Zosyn    LOS: 2 days   I reviewed provider notes, nursing notes, last 24 h vitals and pain scores, last 48 h intake and output, last 24 h labs and trends, and last 24 h imaging results.  This care required moderate level of medical decision making.   Cordella DELENA Idler, MD  [facility] 03/20/2024, 7:51 AM Please see Amion for pager number during day hours 7:00am-4:30pm  "

## 2024-03-20 NOTE — Plan of Care (Signed)
 Patient calm and cooperative A&O X4. Patient left with call bell in reach and bed in lowest position.   Problem: Education: Goal: Knowledge of General Education information will improve Description: Including pain rating scale, medication(s)/side effects and non-pharmacologic comfort measures Outcome: Progressing   Problem: Clinical Measurements: Goal: Ability to maintain clinical measurements within normal limits will improve Outcome: Progressing   Problem: Activity: Goal: Risk for activity intolerance will decrease Outcome: Progressing   Problem: Nutrition: Goal: Adequate nutrition will be maintained Outcome: Progressing   Problem: Coping: Goal: Level of anxiety will decrease Outcome: Progressing   Problem: Pain Managment: Goal: General experience of comfort will improve and/or be controlled Outcome: Progressing   Problem: Safety: Goal: Ability to remain free from injury will improve Outcome: Progressing

## 2024-03-21 LAB — BASIC METABOLIC PANEL WITH GFR
Anion gap: 9 (ref 5–15)
BUN: <5 mg/dL — ABNORMAL LOW (ref 8–23)
CO2: 31 mmol/L (ref 22–32)
Calcium: 8.3 mg/dL — ABNORMAL LOW (ref 8.9–10.3)
Chloride: 97 mmol/L — ABNORMAL LOW (ref 98–111)
Creatinine, Ser: 0.69 mg/dL (ref 0.44–1.00)
GFR, Estimated: >60 mL/min
Glucose, Bld: 92 mg/dL (ref 70–99)
Potassium: 2.9 mmol/L — ABNORMAL LOW (ref 3.5–5.1)
Sodium: 137 mmol/L (ref 135–145)

## 2024-03-21 LAB — CBC
HCT: 38.3 % (ref 36.0–46.0)
Hemoglobin: 13 g/dL (ref 12.0–15.0)
MCH: 30.2 pg (ref 26.0–34.0)
MCHC: 33.9 g/dL (ref 30.0–36.0)
MCV: 89.1 fL (ref 80.0–100.0)
Platelets: 514 K/uL — ABNORMAL HIGH (ref 150–400)
RBC: 4.3 MIL/uL (ref 3.87–5.11)
RDW: 13.5 % (ref 11.5–15.5)
WBC: 18.5 K/uL — ABNORMAL HIGH (ref 4.0–10.5)
nRBC: 0 % (ref 0.0–0.2)

## 2024-03-21 MED ORDER — POTASSIUM CHLORIDE CRYS ER 20 MEQ PO TBCR
40.0000 meq | EXTENDED_RELEASE_TABLET | Freq: Two times a day (BID) | ORAL | Status: AC
  Administered 2024-03-21 (×2): 40 meq via ORAL
  Filled 2024-03-21 (×2): qty 2

## 2024-03-21 MED ORDER — POTASSIUM CHLORIDE 10 MEQ/100ML IV SOLN
10.0000 meq | INTRAVENOUS | Status: AC
  Administered 2024-03-21 (×4): 10 meq via INTRAVENOUS
  Filled 2024-03-21 (×4): qty 100

## 2024-03-21 NOTE — Progress Notes (Addendum)
 "  Progress Note     Subjective: Patient subjectively feels a little bit better today. Denies nausea but appetite is minimal. Afebrile, WBC continues to slowly downtrend (18 today).  Objective: Vital signs in last 24 hours: Temp:  [97.5 F (36.4 C)-98.3 F (36.8 C)] 97.5 F (36.4 C) (01/17 0741) Pulse Rate:  [80-94] 80 (01/17 0741) Resp:  [16-19] 16 (01/17 0741) BP: (127-132)/(65-78) 132/78 (01/17 0741) SpO2:  [93 %-95 %] 93 % (01/17 0741) Last BM Date : 03/18/24  Intake/Output from previous day: 01/16 0701 - 01/17 0700 In: 120 [P.O.:120] Out: 1 [Urine:1] Intake/Output this shift: No intake/output data recorded.  PE: General: Pleasant female who is laying in bed in NAD. Lungs: Respiratory effort nonlabored on room air.  Abd: Soft, nondistended, nontender to palpation MS: Able to move extremities.  Skin: Warm and dry.  Psych: A&Ox3 with an appropriate affect.    Lab Results:  Recent Labs    03/20/24 0508 03/21/24 0942  WBC 19.0* 18.5*  HGB 11.4* 13.0  HCT 33.0* 38.3  PLT 372 514*   BMET Recent Labs    03/20/24 0508 03/21/24 0942  NA 136 137  K 3.0* 2.9*  CL 96* 97*  CO2 32 31  GLUCOSE 113* 92  BUN <5* <5*  CREATININE 0.69 0.69  CALCIUM 8.5* 8.3*   PT/INR Recent Labs    03/20/24 0508  LABPROT 14.1  INR 1.0   CMP     Component Value Date/Time   NA 137 03/21/2024 0942   NA 138 01/24/2024 0949   K 2.9 (L) 03/21/2024 0942   CL 97 (L) 03/21/2024 0942   CO2 31 03/21/2024 0942   GLUCOSE 92 03/21/2024 0942   BUN <5 (L) 03/21/2024 0942   BUN 16 01/24/2024 0949   CREATININE 0.69 03/21/2024 0942   CALCIUM 8.3 (L) 03/21/2024 0942   PROT 7.8 03/16/2024 1513   PROT 7.3 01/24/2024 0949   ALBUMIN 3.6 03/16/2024 1513   ALBUMIN 3.9 01/24/2024 0949   AST 18 03/16/2024 1513   ALT 14 03/16/2024 1513   ALKPHOS 106 03/16/2024 1513   BILITOT 1.3 (H) 03/16/2024 1513   BILITOT 0.6 01/24/2024 0949   GFRNONAA >60 03/21/2024 0942   GFRAA 86 05/28/2019 1217    Lipase     Component Value Date/Time   LIPASE 20 03/16/2024 1513       Studies/Results: CT ABDOMEN PELVIS W CONTRAST Result Date: 03/19/2024 EXAM: CT ABDOMEN AND PELVIS WITH CONTRAST 03/19/2024 10:33:01 PM TECHNIQUE: CT of the abdomen and pelvis was performed with the administration of 75 mL of iohexol  (OMNIPAQUE ) 350 MG/ML injection. Multiplanar reformatted images are provided for review. Automated exposure control, iterative reconstruction, and/or weight-based adjustment of the mA/kV was utilized to reduce the radiation dose to as low as reasonably achievable. COMPARISON: 03/16/2024 CLINICAL HISTORY: RLQ abdominal pain; Perforated appendicitis, follow up CT. FINDINGS: LOWER CHEST: New small right pleural effusion. LIVER: Moderate hepatic steatosis. GALLBLADDER AND BILE DUCTS: Status post cholecystectomy. Mild dilation of the biliary tree likely reflects post cholecystectomy change. Pneumobilia related to prior sphincterotomy. SPLEEN: No acute abnormality. PANCREAS: No acute abnormality. ADRENAL GLANDS: No acute abnormality. KIDNEYS, URETERS AND BLADDER: No stones in the kidneys or ureters. No hydronephrosis. No perinephric or periureteral stranding. Urinary bladder is unremarkable. GI AND BOWEL: Stomach demonstrates no acute abnormality. There is progressive enlargement of the ill-defined region of fluid and gas adjacent to the cecum related to perforated appendicitis in keeping with a developing abscess now measuring 4.2 x 8.5 cm (  series 63, image 2). Appendicolith again noted within the dilated and perforated residual appendix (series 67, image 2). There is progressive gas and fluid distention of the mid and distal small bowel to the point of the terminal ileum where this narrows secondary to inflammatory change as it crosses adjacent to the developing abscess within the right lower quadrant (series 67, image 2), in keeping with a developing small bowel obstruction. Mild distal colonic  diverticulosis. PERITONEUM AND RETROPERITONEUM: New trace ascites. No gross free intraperitoneal gas. VASCULATURE: Aorta is normal in caliber. LYMPH NODES: No lymphadenopathy. REPRODUCTIVE ORGANS: No acute abnormality. BONES AND SOFT TISSUES: No acute osseous abnormality. No focal soft tissue abnormality. IMPRESSION: 1. Progressive enlargement of the right lower quadrant poorly loculated fluid and gas collection adjacent to the cecum related to perforated appendicitis, now measuring 4.2 x 8.5 cm, compatible with a developing abscess, with appendicolith again noted within the residual ruptured appendix. 2. Progressive gas and fluid distention of the mid and distal small bowel to the level of the terminal ileum, compatible with a developing small bowel obstruction due to adjacent inflammatory change. 3. New small right pleural effusion and new trace ascites, without gross free intraperitoneal gas. 4. Moderate hepatic steatosis, status post cholecystectomy with mild biliary tree dilation and pneumobilia related to prior sphincterotomy, and mild distal colonic diverticulosis. Electronically signed by: Andrea Molt MD 03/19/2024 10:46 PM EST RP Workstation: [id number]    Anti-infectives: Anti-infectives (From admission, onward)    Start     Dose/Rate Route Frequency Ordered Stop   03/17/24 0200  piperacillin -tazobactam (ZOSYN ) IVPB 3.375 g        3.375 g 12.5 mL/hr over 240 Minutes Intravenous Every 8 hours 03/16/24 1928 03/24/24 0559   03/16/24 1730  piperacillin -tazobactam (ZOSYN ) IVPB 3.375 g        3.375 g 100 mL/hr over 30 Minutes Intravenous  Once 03/16/24 1719 03/16/24 1809        Assessment/Plan 69 yo female with acute perforated appendicitis with phlegmon. - Repeat imaging shows progressive inflammatory changes. No drainable abscess, evaluated by IR yesterday. - Continue Zosyn  - Ok for full liquid as tolerated - Hypokalemia: replete oral and IV. Repeat labs in am. - If patient continues  to make minimal progress or worsens, may need surgical washout. I discussed this with her this morning. - VTE: lovenox    LOS: 3 days   I reviewed provider notes, nursing notes, last 24 h vitals and pain scores, last 48 h intake and output, last 24 h labs and trends, and last 24 h imaging results.  This care required moderate level of medical decision making.   Leonor LITTIE Dawn, MD  [facility] 03/21/2024, 10:44 AM Please see Amion for pager number during day hours 7:00am-4:30pm  "

## 2024-03-21 NOTE — Plan of Care (Signed)

## 2024-03-22 LAB — BASIC METABOLIC PANEL WITH GFR
Anion gap: 9 (ref 5–15)
BUN: 6 mg/dL — ABNORMAL LOW (ref 8–23)
CO2: 29 mmol/L (ref 22–32)
Calcium: 8.1 mg/dL — ABNORMAL LOW (ref 8.9–10.3)
Chloride: 99 mmol/L (ref 98–111)
Creatinine, Ser: 0.68 mg/dL (ref 0.44–1.00)
GFR, Estimated: >60 mL/min
Glucose, Bld: 84 mg/dL (ref 70–99)
Potassium: 3.9 mmol/L (ref 3.5–5.1)
Sodium: 137 mmol/L (ref 135–145)

## 2024-03-22 LAB — CBC
HCT: 35.6 % — ABNORMAL LOW (ref 36.0–46.0)
Hemoglobin: 12 g/dL (ref 12.0–15.0)
MCH: 30.1 pg (ref 26.0–34.0)
MCHC: 33.7 g/dL (ref 30.0–36.0)
MCV: 89.2 fL (ref 80.0–100.0)
Platelets: 486 K/uL — ABNORMAL HIGH (ref 150–400)
RBC: 3.99 MIL/uL (ref 3.87–5.11)
RDW: 13.3 % (ref 11.5–15.5)
WBC: 16.7 K/uL — ABNORMAL HIGH (ref 4.0–10.5)
nRBC: 0 % (ref 0.0–0.2)

## 2024-03-22 MED ORDER — POTASSIUM CHLORIDE 20 MEQ PO PACK
40.0000 meq | PACK | Freq: Once | ORAL | Status: AC
  Administered 2024-03-22: 40 meq via ORAL
  Filled 2024-03-22: qty 2

## 2024-03-22 NOTE — Plan of Care (Signed)

## 2024-03-22 NOTE — Progress Notes (Signed)
 Pt is asking for something for heartburn and something for diarrhea.  Will call [facility] in am for orders due to time

## 2024-03-22 NOTE — Progress Notes (Signed)
 "  Progress Note     Subjective: Doing a little better. Still reports abdominal distention/fullness. Denies nausea or vomiting. Started having loose, non-bloody stools yesterday (4-5 times). Afebrile.   Objective: Vital signs in last 24 hours: Temp:  [97.4 F (36.3 C)-98 F (36.7 C)] 98 F (36.7 C) (01/18 0341) Pulse Rate:  [79-83] 83 (01/18 0341) Resp:  [16-20] 20 (01/18 0341) BP: (126-132)/(67-78) 128/72 (01/18 0341) SpO2:  [93 %-98 %] 98 % (01/18 0341) Last BM Date : 03/18/24  Intake/Output from previous day: No intake/output data recorded. Intake/Output this shift: No intake/output data recorded.  PE: General: Pleasant female who is laying in bed in NAD. Lungs: Respiratory effort nonlabored on room air.  Abd: Soft, mild distention, nontender to palpation MS: Able to move extremities.  Skin: Warm and dry.  Psych: A&Ox3 with an appropriate affect.    Lab Results:  Recent Labs    03/21/24 0942 03/22/24 0456  WBC 18.5* 16.7*  HGB 13.0 12.0  HCT 38.3 35.6*  PLT 514* 486*   BMET Recent Labs    03/21/24 0942 03/22/24 0456  NA 137 137  K 2.9* 3.9  CL 97* 99  CO2 31 29  GLUCOSE 92 84  BUN <5* 6*  CREATININE 0.69 0.68  CALCIUM 8.3* 8.1*   PT/INR Recent Labs    03/20/24 0508  LABPROT 14.1  INR 1.0   CMP     Component Value Date/Time   NA 137 03/22/2024 0456   NA 138 01/24/2024 0949   K 3.9 03/22/2024 0456   CL 99 03/22/2024 0456   CO2 29 03/22/2024 0456   GLUCOSE 84 03/22/2024 0456   BUN 6 (L) 03/22/2024 0456   BUN 16 01/24/2024 0949   CREATININE 0.68 03/22/2024 0456   CALCIUM 8.1 (L) 03/22/2024 0456   PROT 7.8 03/16/2024 1513   PROT 7.3 01/24/2024 0949   ALBUMIN 3.6 03/16/2024 1513   ALBUMIN 3.9 01/24/2024 0949   AST 18 03/16/2024 1513   ALT 14 03/16/2024 1513   ALKPHOS 106 03/16/2024 1513   BILITOT 1.3 (H) 03/16/2024 1513   BILITOT 0.6 01/24/2024 0949   GFRNONAA >60 03/22/2024 0456   GFRAA 86 05/28/2019 1217   Lipase     Component  Value Date/Time   LIPASE 20 03/16/2024 1513       Studies/Results: No results found.   Anti-infectives: Anti-infectives (From admission, onward)    Start     Dose/Rate Route Frequency Ordered Stop   03/17/24 0200  piperacillin -tazobactam (ZOSYN ) IVPB 3.375 g        3.375 g 12.5 mL/hr over 240 Minutes Intravenous Every 8 hours 03/16/24 1928 03/24/24 0559   03/16/24 1730  piperacillin -tazobactam (ZOSYN ) IVPB 3.375 g        3.375 g 100 mL/hr over 30 Minutes Intravenous  Once 03/16/24 1719 03/16/24 1809        Assessment/Plan 69 yo female with acute perforated appendicitis with phlegmon. - Repeat imaging shows progressive inflammatory changes. No drainable abscess, evaluated by IR 1/16.  - Continue Zosyn  - Ok for soft diet as tolerated - Hypokalemia: resolved - If patient continues to make minimal progress or worsens, may need surgical washout vs repeat can/IR consult.  - VTE: lovenox    LOS: 4 days   I reviewed provider notes, nursing notes, last 24 h vitals and pain scores, last 48 h intake and output, last 24 h labs and trends, and last 24 h imaging results.  This care required moderate level of medical decision  making.   Almarie GORMAN Pringle, [facility] 03/22/2024, 10:11 AM Please see Amion for pager number during day hours 7:00am-4:30pm  "

## 2024-03-22 NOTE — Plan of Care (Signed)
 Pt states she had a bowel movement yesterday that wasn't recorded. Ab distention and pt feels like  it just won't go down. Pt feels full in stomach Problem: Education: Goal: Knowledge of General Education information will improve Description: Including pain rating scale, medication(s)/side effects and non-pharmacologic comfort measures Outcome: Progressing   Problem: Health Behavior/Discharge Planning: Goal: Ability to manage health-related needs will improve Outcome: Progressing   Problem: Clinical Measurements: Goal: Ability to maintain clinical measurements within normal limits will improve Outcome: Progressing Goal: Will remain free from infection Outcome: Progressing Goal: Diagnostic test results will improve Outcome: Progressing Goal: Respiratory complications will improve Outcome: Progressing Goal: Cardiovascular complication will be avoided Outcome: Progressing   Problem: Activity: Goal: Risk for activity intolerance will decrease Outcome: Progressing   Problem: Nutrition: Goal: Adequate nutrition will be maintained Outcome: Progressing   Problem: Coping: Goal: Level of anxiety will decrease Outcome: Progressing   Problem: Elimination: Goal: Will not experience complications related to bowel motility Outcome: Progressing Goal: Will not experience complications related to urinary retention Outcome: Progressing   Problem: Pain Managment: Goal: General experience of comfort will improve and/or be controlled Outcome: Progressing   Problem: Safety: Goal: Ability to remain free from injury will improve Outcome: Progressing   Problem: Skin Integrity: Goal: Risk for impaired skin integrity will decrease Outcome: Progressing   Problem: Activity: Goal: Risk for activity intolerance will decrease Outcome: Progressing   Problem: Nutrition: Goal: Adequate nutrition will be maintained Outcome: Progressing   Problem: Coping: Goal: Level of anxiety will  decrease Outcome: Progressing   Problem: Elimination: Goal: Will not experience complications related to bowel motility Outcome: Progressing   Problem: Elimination: Goal: Will not experience complications related to urinary retention Outcome: Progressing   Problem: Pain Managment: Goal: General experience of comfort will improve and/or be controlled Outcome: Progressing   Problem: Safety: Goal: Ability to remain free from injury will improve Outcome: Progressing   Problem: Skin Integrity: Goal: Risk for impaired skin integrity will decrease Outcome: Progressing

## 2024-03-23 LAB — CBC
HCT: 37.5 % (ref 36.0–46.0)
Hemoglobin: 12.3 g/dL (ref 12.0–15.0)
MCH: 29.9 pg (ref 26.0–34.0)
MCHC: 32.8 g/dL (ref 30.0–36.0)
MCV: 91 fL (ref 80.0–100.0)
Platelets: 517 K/uL — ABNORMAL HIGH (ref 150–400)
RBC: 4.12 MIL/uL (ref 3.87–5.11)
RDW: 13.6 % (ref 11.5–15.5)
WBC: 17.8 K/uL — ABNORMAL HIGH (ref 4.0–10.5)
nRBC: 0 % (ref 0.0–0.2)

## 2024-03-23 LAB — BASIC METABOLIC PANEL WITH GFR
Anion gap: 9 (ref 5–15)
BUN: 6 mg/dL — ABNORMAL LOW (ref 8–23)
CO2: 27 mmol/L (ref 22–32)
Calcium: 8.7 mg/dL — ABNORMAL LOW (ref 8.9–10.3)
Chloride: 100 mmol/L (ref 98–111)
Creatinine, Ser: 0.84 mg/dL (ref 0.44–1.00)
GFR, Estimated: >60 mL/min
Glucose, Bld: 105 mg/dL — ABNORMAL HIGH (ref 70–99)
Potassium: 4 mmol/L (ref 3.5–5.1)
Sodium: 136 mmol/L (ref 135–145)

## 2024-03-23 LAB — URINALYSIS, ROUTINE W REFLEX MICROSCOPIC
Bilirubin Urine: NEGATIVE
Glucose, UA: NEGATIVE mg/dL
Hgb urine dipstick: NEGATIVE
Ketones, ur: NEGATIVE mg/dL
Leukocytes,Ua: NEGATIVE
Nitrite: NEGATIVE
Protein, ur: NEGATIVE mg/dL
Specific Gravity, Urine: 1.004 — ABNORMAL LOW (ref 1.005–1.030)
pH: 7 (ref 5.0–8.0)

## 2024-03-23 LAB — GLUCOSE, CAPILLARY
Glucose-Capillary: 109 mg/dL — ABNORMAL HIGH (ref 70–99)
Glucose-Capillary: 89 mg/dL (ref 70–99)
Glucose-Capillary: 103 mg/dL — ABNORMAL HIGH (ref 70–99)

## 2024-03-23 LAB — HEMOGLOBIN A1C
Hgb A1c MFr Bld: 6.1 % — ABNORMAL HIGH (ref 4.8–5.6)
Mean Plasma Glucose: 128.37 mg/dL

## 2024-03-23 MED ORDER — INSULIN ASPART 100 UNIT/ML IJ SOLN
0.0000 [IU] | Freq: Three times a day (TID) | INTRAMUSCULAR | Status: DC
  Administered 2024-03-24: 3 [IU] via SUBCUTANEOUS
  Administered 2024-03-25 – 2024-03-28 (×5): 2 [IU] via SUBCUTANEOUS
  Administered 2024-03-29: 3 [IU] via SUBCUTANEOUS
  Administered 2024-03-29 – 2024-03-31 (×2): 2 [IU] via SUBCUTANEOUS
  Filled 2024-03-23 (×2): qty 2
  Filled 2024-03-23: qty 3
  Filled 2024-03-23 (×4): qty 2
  Filled 2024-03-23: qty 3
  Filled 2024-03-23: qty 2

## 2024-03-23 MED ORDER — INSULIN ASPART 100 UNIT/ML IJ SOLN
4.0000 [IU] | Freq: Three times a day (TID) | INTRAMUSCULAR | Status: DC
  Administered 2024-03-24 – 2024-03-25 (×3): 4 [IU] via SUBCUTANEOUS
  Filled 2024-03-23 (×3): qty 4

## 2024-03-23 NOTE — Anesthesia Preprocedure Evaluation (Signed)
 "                                  Anesthesia Evaluation  Patient identified by MRN, date of birth, ID band Patient awake    Reviewed: Allergy & Precautions, NPO status , Patient's Chart, lab work & pertinent test results  History of Anesthesia Complications Negative for: history of anesthetic complications  Airway Mallampati: II  TM Distance: >3 FB Neck ROM: Full    Dental no notable dental hx. (+) Teeth Intact, Dental Advisory Given   Pulmonary neg pulmonary ROS   Pulmonary exam normal breath sounds clear to auscultation       Cardiovascular (-) hypertension(-) angina (-) Past MI Normal cardiovascular exam Rhythm:Regular Rate:Normal     Neuro/Psych  PSYCHIATRIC DISORDERS Anxiety      Neuromuscular disease    GI/Hepatic ,GERD  Medicated and Controlled,,  Endo/Other  diabetes, Type 1, Insulin  Dependent        Renal/GU Lab Results      Component                Value               Date                                 K                        4.0                 03/23/2024                CO2                      27                  03/23/2024                BUN                      6 (L)               03/23/2024                CREATININE               0.84                03/23/2024                     Musculoskeletal negative musculoskeletal ROS (+)    Abdominal   Peds  Hematology negative hematology ROS (+) On lovenox   Lab Results      Component                Value               Date                      WBC                      17.8 (H)            03/23/2024  HGB                      12.3                03/23/2024                HCT                      37.5                03/23/2024                MCV                      91.0                03/23/2024                PLT                      517 (H)             03/23/2024              Anesthesia Other Findings   Reproductive/Obstetrics negative OB ROS                               Anesthesia Physical Anesthesia Plan  ASA: 2  Anesthesia Plan: General   Post-op Pain Management: Precedex  and Ofirmev  IV (intra-op)*   Induction: Intravenous  PONV Risk Score and Plan: 3 and Treatment may vary due to age or medical condition, Midazolam , Dexamethasone  and Ondansetron   Airway Management Planned: Oral ETT  Additional Equipment: None  Intra-op Plan:   Post-operative Plan: Extubation in OR  Informed Consent: I have reviewed the patients History and Physical, chart, labs and discussed the procedure including the risks, benefits and alternatives for the proposed anesthesia with the patient or authorized representative who has indicated his/her understanding and acceptance.     Dental advisory given  Plan Discussed with: CRNA and Anesthesiologist  Anesthesia Plan Comments:          Anesthesia Quick Evaluation  "

## 2024-03-23 NOTE — Plan of Care (Signed)

## 2024-03-23 NOTE — Care Management Important Message (Signed)
 Important Message  Patient Details  Name: Andrea Pittman MRN: [id number] Date of Birth: 01/09/1956   Important Message Given:  Yes - Medicare IM     Claretta Deed 03/23/2024, 3:53 PM

## 2024-03-23 NOTE — Plan of Care (Signed)

## 2024-03-23 NOTE — Progress Notes (Addendum)
 "  Progress Note     Subjective: More pain in RLQ today than last 2 days.  Tolerating soft diet ok.  Diarrhea seems to have improved some today.  No nausea or vomiting.  C/o dysuria   ROS  dysuria  Objective: Vital signs in last 24 hours: Temp:  [97.4 F (36.3 C)-98.3 F (36.8 C)] 98.3 F (36.8 C) (01/19 0746) Pulse Rate:  [81-89] 82 (01/19 0746) Resp:  [17-20] 17 (01/19 0746) BP: (125-147)/(69-83) 146/83 (01/19 0746) SpO2:  [97 %-99 %] 99 % (01/19 0746) Last BM Date : 03/21/24  Intake/Output from previous day: 01/18 0701 - 01/19 0700 In: 120 [P.O.:120] Out: -  Intake/Output this shift: No intake/output data recorded.  PE: General: Pleasant female who is laying in bed in NAD. Lungs: Respiratory effort nonlabored on room air.  Abd: Soft, ND. TTP in the RLQ and RUQ. No rebound/guarding, no peritoneal signs. Psych: A&Ox3 with an appropriate affect.    Lab Results:  Recent Labs    03/22/24 0456 03/23/24 0415  WBC 16.7* 17.8*  HGB 12.0 12.3  HCT 35.6* 37.5  PLT 486* 517*   BMET Recent Labs    03/22/24 0456 03/23/24 0415  NA 137 136  K 3.9 4.0  CL 99 100  CO2 29 27  GLUCOSE 84 105*  BUN 6* 6*  CREATININE 0.68 0.84  CALCIUM 8.1* 8.7*   PT/INR No results for input(s): LABPROT, INR in the last 72 hours. CMP     Component Value Date/Time   NA 136 03/23/2024 0415   NA 138 01/24/2024 0949   K 4.0 03/23/2024 0415   CL 100 03/23/2024 0415   CO2 27 03/23/2024 0415   GLUCOSE 105 (H) 03/23/2024 0415   BUN 6 (L) 03/23/2024 0415   BUN 16 01/24/2024 0949   CREATININE 0.84 03/23/2024 0415   CALCIUM 8.7 (L) 03/23/2024 0415   PROT 7.8 03/16/2024 1513   PROT 7.3 01/24/2024 0949   ALBUMIN 3.6 03/16/2024 1513   ALBUMIN 3.9 01/24/2024 0949   AST 18 03/16/2024 1513   ALT 14 03/16/2024 1513   ALKPHOS 106 03/16/2024 1513   BILITOT 1.3 (H) 03/16/2024 1513   BILITOT 0.6 01/24/2024 0949   GFRNONAA >60 03/23/2024 0415   GFRAA 86 05/28/2019 1217   Lipase      Component Value Date/Time   LIPASE 20 03/16/2024 1513       Studies/Results: No results found.  Anti-infectives: Anti-infectives (From admission, onward)    Start     Dose/Rate Route Frequency Ordered Stop   03/17/24 0200  piperacillin -tazobactam (ZOSYN ) IVPB 3.375 g        3.375 g 12.5 mL/hr over 240 Minutes Intravenous Every 8 hours 03/16/24 1928 03/24/24 0559   03/16/24 1730  piperacillin -tazobactam (ZOSYN ) IVPB 3.375 g        3.375 g 100 mL/hr over 30 Minutes Intravenous  Once 03/16/24 1719 03/16/24 1809        Assessment/Plan acute perforated appendicitis - WBC stable around 17K - having a bit more pain today than the last 2 days.   - Continue IV abx - at the point the patient has been here for almost a week and still having pain in the RLQ with a persistently elevated WBC.  She will likely require surgical intervention in order to get better -+ appendicolith noted on CT scan as well - soft diet, but NPO p MN - repeat labs in am - mobilize   FEN - carb mod, NPO p MN,  MIVF @50cc  VTE - Lovenox , SCDs ID - Zosyn   DM - SSI added, monitor CBGs.  Ozempic  on hold Dysuria - will check UA/cx   LOS: 5 days   I reviewed nursing notes, last 24 h vitals and pain scores, last 48 h intake and output, last 24 h labs and trends, and last 24 h imaging results.   Burnard FORBES Banter, [facility] 03/23/2024, 10:09 AM Please see Amion for pager number during day hours 7:00am-4:30pm  "

## 2024-03-24 ENCOUNTER — Encounter (HOSPITAL_COMMUNITY): Admission: EM | Disposition: A | Discharge status: Home / Self Care | Payer: Self-pay | Source: Home / Self Care

## 2024-03-24 ENCOUNTER — Encounter (HOSPITAL_COMMUNITY): Payer: Self-pay

## 2024-03-24 ENCOUNTER — Inpatient Hospital Stay (HOSPITAL_COMMUNITY): Admitting: Anesthesiology

## 2024-03-24 DIAGNOSIS — Z6832 Body mass index (BMI) 32.0-32.9, adult: Secondary | ICD-10-CM | POA: Diagnosis not present

## 2024-03-24 DIAGNOSIS — E109 Type 1 diabetes mellitus without complications: Secondary | ICD-10-CM

## 2024-03-24 DIAGNOSIS — K3532 Acute appendicitis with perforation and localized peritonitis, without abscess: Secondary | ICD-10-CM

## 2024-03-24 DIAGNOSIS — F418 Other specified anxiety disorders: Secondary | ICD-10-CM | POA: Diagnosis not present

## 2024-03-24 DIAGNOSIS — E669 Obesity, unspecified: Secondary | ICD-10-CM

## 2024-03-24 HISTORY — PX: LAPAROSCOPY: SHX197

## 2024-03-24 LAB — GLUCOSE, CAPILLARY
Glucose-Capillary: 97 mg/dL (ref 70–99)
Glucose-Capillary: 90 mg/dL (ref 70–99)
Glucose-Capillary: 148 mg/dL — ABNORMAL HIGH (ref 70–99)
Glucose-Capillary: 151 mg/dL — ABNORMAL HIGH (ref 70–99)
Glucose-Capillary: 114 mg/dL — ABNORMAL HIGH (ref 70–99)
Glucose-Capillary: 134 mg/dL — ABNORMAL HIGH (ref 70–99)

## 2024-03-24 LAB — CBC
HCT: 38.5 % (ref 36.0–46.0)
Hemoglobin: 12.7 g/dL (ref 12.0–15.0)
MCH: 30 pg (ref 26.0–34.0)
MCHC: 33 g/dL (ref 30.0–36.0)
MCV: 91 fL (ref 80.0–100.0)
Platelets: 574 K/uL — ABNORMAL HIGH (ref 150–400)
RBC: 4.23 MIL/uL (ref 3.87–5.11)
RDW: 13.6 % (ref 11.5–15.5)
WBC: 16.1 K/uL — ABNORMAL HIGH (ref 4.0–10.5)
nRBC: 0 % (ref 0.0–0.2)

## 2024-03-24 LAB — BASIC METABOLIC PANEL WITH GFR
Anion gap: 11 (ref 5–15)
BUN: 7 mg/dL — ABNORMAL LOW (ref 8–23)
CO2: 29 mmol/L (ref 22–32)
Calcium: 9.2 mg/dL (ref 8.9–10.3)
Chloride: 98 mmol/L (ref 98–111)
Creatinine, Ser: 0.85 mg/dL (ref 0.44–1.00)
GFR, Estimated: >60 mL/min
Glucose, Bld: 88 mg/dL (ref 70–99)
Potassium: 3.7 mmol/L (ref 3.5–5.1)
Sodium: 138 mmol/L (ref 135–145)

## 2024-03-24 LAB — ABO/RH: ABO/RH(D): O POS

## 2024-03-24 LAB — TYPE AND SCREEN
ABO/RH(D): O POS
Antibody Screen: NEGATIVE

## 2024-03-24 LAB — URINE CULTURE: Culture: NO GROWTH

## 2024-03-24 MED ORDER — ONDANSETRON HCL 4 MG/2ML IJ SOLN
INTRAMUSCULAR | Status: DC | PRN
  Administered 2024-03-24: 4 mg via INTRAVENOUS

## 2024-03-24 MED ORDER — HYDROMORPHONE HCL 1 MG/ML IJ SOLN
0.2500 mg | INTRAMUSCULAR | Status: DC | PRN
  Administered 2024-03-24: 0.25 mg via INTRAVENOUS

## 2024-03-24 MED ORDER — SODIUM CHLORIDE 0.9 % IR SOLN
Status: DC | PRN
  Administered 2024-03-24: 3000 mL

## 2024-03-24 MED ORDER — CHLORHEXIDINE GLUCONATE 0.12 % MT SOLN
15.0000 mL | Freq: Once | OROMUCOSAL | Status: AC
  Administered 2024-03-24: 15 mL via OROMUCOSAL

## 2024-03-24 MED ORDER — METHOCARBAMOL 500 MG PO TABS
500.0000 mg | ORAL_TABLET | Freq: Three times a day (TID) | ORAL | Status: DC
  Administered 2024-03-24 – 2024-03-25 (×5): 500 mg via ORAL
  Filled 2024-03-24 (×6): qty 1

## 2024-03-24 MED ORDER — PIPERACILLIN-TAZOBACTAM 3.375 G IVPB
3.3750 g | INTRAVENOUS | Status: AC
  Administered 2024-03-24: 3.375 g via INTRAVENOUS
  Filled 2024-03-24: qty 50

## 2024-03-24 MED ORDER — ENOXAPARIN SODIUM 40 MG/0.4ML IJ SOSY
40.0000 mg | PREFILLED_SYRINGE | INTRAMUSCULAR | Status: DC
  Administered 2024-03-25 – 2024-03-29 (×5): 40 mg via SUBCUTANEOUS
  Filled 2024-03-24 (×5): qty 0.4

## 2024-03-24 MED ORDER — DROPERIDOL 2.5 MG/ML IJ SOLN
INTRAMUSCULAR | Status: AC
  Filled 2024-03-24: qty 2

## 2024-03-24 MED ORDER — BUPIVACAINE-EPINEPHRINE (PF) 0.25% -1:200000 IJ SOLN
INTRAMUSCULAR | Status: DC | PRN
  Administered 2024-03-24: 30 mL

## 2024-03-24 MED ORDER — INSULIN ASPART 100 UNIT/ML IJ SOLN: 0.0000 [IU] | INTRAMUSCULAR | Status: DC | PRN

## 2024-03-24 MED ORDER — ACETAMINOPHEN 10 MG/ML IV SOLN: 1000.0000 mg | Freq: Once | INTRAVENOUS | Status: DC | PRN

## 2024-03-24 MED ORDER — HYDROMORPHONE HCL 1 MG/ML IJ SOLN
INTRAMUSCULAR | Status: AC
  Filled 2024-03-24: qty 1

## 2024-03-24 MED ORDER — 0.9 % SODIUM CHLORIDE (POUR BTL) OPTIME
TOPICAL | Status: DC | PRN
  Administered 2024-03-24: 1000 mL

## 2024-03-24 MED ORDER — OXYCODONE HCL 5 MG PO TABS: 5.0000 mg | ORAL_TABLET | Freq: Once | ORAL | Status: DC | PRN

## 2024-03-24 MED ORDER — LIDOCAINE 2% (20 MG/ML) 5 ML SYRINGE
INTRAMUSCULAR | Status: DC | PRN
  Administered 2024-03-24: 100 mg via INTRAVENOUS

## 2024-03-24 MED ORDER — DROPERIDOL 2.5 MG/ML IJ SOLN
0.6250 mg | Freq: Once | INTRAMUSCULAR | Status: AC | PRN
  Administered 2024-03-24: 0.625 mg via INTRAVENOUS

## 2024-03-24 MED ORDER — ACETAMINOPHEN 10 MG/ML IV SOLN
INTRAVENOUS | Status: AC
  Filled 2024-03-24: qty 100

## 2024-03-24 MED ORDER — ONDANSETRON HCL 4 MG/2ML IJ SOLN: 4.0000 mg | Freq: Once | INTRAMUSCULAR | Status: DC | PRN

## 2024-03-24 MED ORDER — FENTANYL CITRATE (PF) 250 MCG/5ML IJ SOLN
INTRAMUSCULAR | Status: DC | PRN
  Administered 2024-03-24 (×4): 50 ug via INTRAVENOUS

## 2024-03-24 MED ORDER — PHENYLEPHRINE HCL-NACL 20-0.9 MG/250ML-% IV SOLN
INTRAVENOUS | Status: DC | PRN
  Administered 2024-03-24: 40 ug/min via INTRAVENOUS

## 2024-03-24 MED ORDER — PIPERACILLIN-TAZOBACTAM 3.375 G IVPB
3.3750 g | Freq: Three times a day (TID) | INTRAVENOUS | Status: DC
  Administered 2024-03-24 – 2024-03-27 (×8): 3.375 g via INTRAVENOUS
  Filled 2024-03-24 (×8): qty 50

## 2024-03-24 MED ORDER — OXYCODONE HCL 5 MG/5ML PO SOLN: 5.0000 mg | Freq: Once | ORAL | Status: DC | PRN

## 2024-03-24 MED ORDER — ACETAMINOPHEN 10 MG/ML IV SOLN
INTRAVENOUS | Status: DC | PRN
  Administered 2024-03-24: 1000 mg via INTRAVENOUS

## 2024-03-24 MED ORDER — DEXMEDETOMIDINE HCL IN NACL 80 MCG/20ML IV SOLN
INTRAVENOUS | Status: DC | PRN
  Administered 2024-03-24: 8 ug via INTRAVENOUS
  Administered 2024-03-24: 4 ug via INTRAVENOUS

## 2024-03-24 MED ORDER — ORAL CARE MOUTH RINSE: 15.0000 mL | Freq: Once | OROMUCOSAL | Status: AC

## 2024-03-24 MED ORDER — LACTATED RINGERS IV SOLN: INTRAVENOUS | Status: DC

## 2024-03-24 MED ORDER — DEXAMETHASONE SOD PHOSPHATE PF 10 MG/ML IJ SOLN
INTRAMUSCULAR | Status: DC | PRN
  Administered 2024-03-24: 4 mg via INTRAVENOUS

## 2024-03-24 MED ORDER — PROPOFOL 10 MG/ML IV BOLUS
INTRAVENOUS | Status: DC | PRN
  Administered 2024-03-24: 120 mg via INTRAVENOUS

## 2024-03-24 MED ORDER — SUGAMMADEX SODIUM 200 MG/2ML IV SOLN
INTRAVENOUS | Status: DC | PRN
  Administered 2024-03-24: 200 mg via INTRAVENOUS

## 2024-03-24 MED ORDER — ROCURONIUM BROMIDE 50 MG/5ML IV SOSY
PREFILLED_SYRINGE | INTRAVENOUS | Status: DC | PRN
  Administered 2024-03-24: 90 mg via INTRAVENOUS

## 2024-03-24 NOTE — Plan of Care (Signed)
" °  Problem: Education: Goal: Knowledge of General Education information will improve Description: Including pain rating scale, medication(s)/side effects and non-pharmacologic comfort measures Outcome: Progressing   Problem: Health Behavior/Discharge Planning: Goal: Ability to manage health-related needs will improve Outcome: Progressing   Problem: Clinical Measurements: Goal: Ability to maintain clinical measurements within normal limits will improve Outcome: Progressing Goal: Will remain free from infection Outcome: Progressing Goal: Diagnostic test results will improve Outcome: Progressing Goal: Respiratory complications will improve Outcome: Progressing Goal: Cardiovascular complication will be avoided Outcome: Progressing   Problem: Activity: Goal: Risk for activity intolerance will decrease Outcome: Progressing   Problem: Nutrition: Goal: Adequate nutrition will be maintained Outcome: Progressing   Problem: Coping: Goal: Level of anxiety will decrease Outcome: Progressing   Problem: Elimination: Goal: Will not experience complications related to bowel motility Outcome: Progressing Goal: Will not experience complications related to urinary retention Outcome: Progressing   Problem: Pain Managment: Goal: General experience of comfort will improve and/or be controlled Outcome: Progressing   Problem: Safety: Goal: Ability to remain free from injury will improve Outcome: Progressing   Problem: Skin Integrity: Goal: Risk for impaired skin integrity will decrease Outcome: Progressing   Problem: Education: Goal: Ability to describe self-care measures that may prevent or decrease complications (Diabetes Survival Skills Education) will improve Outcome: Progressing Goal: Individualized Educational Video(s) Outcome: Progressing   Problem: Coping: Goal: Ability to adjust to condition or change in health will improve Outcome: Progressing   Problem: Fluid  Volume: Goal: Ability to maintain a balanced intake and output will improve Outcome: Progressing   Problem: Health Behavior/Discharge Planning: Goal: Ability to identify and utilize available resources and services will improve Outcome: Progressing Goal: Ability to manage health-related needs will improve Outcome: Progressing   Problem: Metabolic: Goal: Ability to maintain appropriate glucose levels will improve Outcome: Progressing   Problem: Nutritional: Goal: Maintenance of adequate nutrition will improve Outcome: Progressing Goal: Progress toward achieving an optimal weight will improve Outcome: Progressing   Problem: Skin Integrity: Goal: Risk for impaired skin integrity will decrease Outcome: Progressing   Problem: Tissue Perfusion: Goal: Adequacy of tissue perfusion will improve Outcome: Progressing   Problem: Education: Goal: Knowledge of the prescribed therapeutic regimen will improve Outcome: Progressing   Problem: Bowel/Gastric: Goal: Gastrointestinal status for postoperative course will improve Outcome: Progressing   Problem: Cardiac: Goal: Ability to maintain an adequate cardiac output Outcome: Progressing Goal: Will show no evidence of cardiac arrhythmias Outcome: Progressing   Problem: Nutritional: Goal: Will attain and maintain optimal nutritional status Outcome: Progressing   Problem: Neurological: Goal: Will regain or maintain usual level of consciousness Outcome: Progressing   Problem: Clinical Measurements: Goal: Ability to maintain clinical measurements within normal limits Outcome: Progressing Goal: Postoperative complications will be avoided or minimized Outcome: Progressing   Problem: Respiratory: Goal: Will regain and/or maintain adequate ventilation Outcome: Progressing Goal: Respiratory status will improve Outcome: Progressing   Problem: Skin Integrity: Goal: Demonstrates signs of wound healing without infection Outcome:  Progressing   Problem: Urinary Elimination: Goal: Will remain free from infection Outcome: Progressing Goal: Ability to achieve and maintain adequate urine output Outcome: Progressing   "

## 2024-03-24 NOTE — Op Note (Signed)
 Date: 03/24/24  Patient: Andrea Pittman MRN: [id number]  Preoperative Diagnosis: Acute perforated appendicitis Postoperative Diagnosis: Same  Procedure: Laparoscopic appendectomy, she had an intra-abdominal abscess, drain placement  Surgeon: Leonor Dawn, MD  EBL: Minimal  Anesthesia: General Endotracheal  Specimens: Appendix  Indications: Andrea Pittman is a 69 year old female who was admitted 1 week ago with acute perforated appendicitis.  She did not have a drainable abscess and was initially treated nonoperatively with IV antibiotics.  Follow-up imaging several days after admission showed no mature collection amenable to percutaneous drainage.  She has continued to have right lower quadrant pain with development of mild abdominal distention, and her white blood cell count is now normalized.  After discussion of the risks and benefits of surgery, she agreed to proceed with operative intervention.  Findings: Periappendiceal abscess walled off by the cecum and terminal ileum, with perforation of the base of the appendix.  There were multiple fecaliths within the abscess.  A 19-Fr drain was placed in the right lower quadrant.  Procedure details: Informed consent was obtained in the preoperative area prior to the procedure. The patient was brought to the operating room and placed on the table in the supine position.  General anesthesia was induced and appropriate lines and drains were placed for intraoperative monitoring. Perioperative antibiotics were administered per SCIP guidelines. The abdomen was prepped and draped in the usual sterile fashion. A pre-procedure timeout was taken verifying patient identity, surgical site and procedure to be performed.  A small infraumbilical skin incision was made, the umbilical stalk was grasped and elevated, and a Veress needle was placed.  Intraperitoneal placement was confirmed saline drop test however the abdomen was insufflated.  A 5 mm Visiport was placed,  but went into the retrorectus space, which has been partially insufflated.  The port was removed and a Norval entry was performed. The fascia was grasped and elevated, the fascia was incised, the peritoneal cavity was visualized.  A 12 mm Hassan port was placed and the abdomen was insufflated.  The peritoneal cavity was inspected with no evidence of visceral or vascular injury.  A 5 mm suprapubic port was placed, followed by 5 mm port in the left lower quadrant, both under direct visualization.  The patient was placed in Trendelenburg position with the right side elevated.  The cecum and terminal ileum were adherent over the abdominal sidewall and the right lower quadrant.  The terminal ileum was gently separated from the abdominal wall, and a large abscess cavity was entered.  Pus and multiple fecaliths were evacuated from this cavity, and the cavity was thoroughly irrigated.  On further mobilization of the terminal ileum, an additional abscess cavity was drained.  The appendix also became visible within the abscess cavity.  The body and tail were adherent to the pelvic sidewall, and the base appeared to have been perforated and had already separated from the cecum.  The cecum was examined and a defect was identified which appeared to be the appendiceal stump.  The cecum was partially mobilized along the line of Toldt, and some adjacent inflammatory rind was gently removed using blunt dissection.  The tissue surrounding the appendiceal stump appeared well-perfused and healthy.  An Echelon stapler with a blue load was used to resect a small button of cecum around the appendiceal stump, which closed the defect.  The appendix was then separated from the pelvic sidewall using harmonic shears and blunt dissection.  The appendix was placed in an Endo Catch bag and removed, and  sent for routine pathology.  The surgical site was copiously irrigated.  The cecal staple line was carefully examined and appeared intact with no  evidence of bleeding or leakage.  A 19-Fr JP drain was placed within the abscess cavity adjacent to the cecum and brought out through the left lower quadrant port site.  The drain was secured to the skin using 2-0 nylon suture.  The umbilical port site fascia was closed with a 0 Vicryl suture using a PMI.  The remaining ports were removed and the abdomen was desufflated.  The port sites were closed with 4-0 Monocryl subcuticular suture.  Dermabond was applied.  The patient tolerated the procedure well with no apparent complications.  All counts were correct x2 at the end of the procedure. The patient was extubated and taken to PACU in stable condition.  Leonor Dawn, MD 03/24/24 10:54 AM

## 2024-03-24 NOTE — Transfer of Care (Signed)
 Immediate Anesthesia Transfer of Care Note  Patient: Andrea Pittman  Procedure(s) Performed: LAPAROSCOPY, DIAGNOSTIC with appendectomy, lysis of adhesions  Patient Location: PACU  Anesthesia Type:General  Level of Consciousness: drowsy and patient cooperative  Airway & Oxygen Therapy: Patient Spontanous Breathing and Patient connected to face mask oxygen  Post-op Assessment: Report given to RN and Post -op Vital signs reviewed and stable  Post vital signs: Reviewed and stable  Last Vitals:  Vitals Value Taken Time  BP 148/71 03/24/24 10:49  Temp    Pulse 90 03/24/24 10:52  Resp 16 03/24/24 10:52  SpO2 92 % 03/24/24 10:52  Vitals shown include unfiled device data.  Last Pain:  Vitals:   03/24/24 0812  TempSrc:   PainSc: 2       Patients Stated Pain Goal: 0 (03/22/24 2343)  Complications: No notable events documented.

## 2024-03-24 NOTE — Progress Notes (Signed)
 "  Progress Note  * Day of Surgery *  Subjective: Pain is improved today. Reports significant bloating. WBC stable at 16.  Objective: Vital signs in last 24 hours: Temp:  [98.2 F (36.8 C)-99.2 F (37.3 C)] 98.8 F (37.1 C) (01/20 0801) Pulse Rate:  [80-89] 80 (01/20 0801) Resp:  [18-20] 18 (01/20 0801) BP: (134-147)/(68-86) 147/86 (01/20 0801) SpO2:  [95 %-99 %] 96 % (01/20 0801) Weight:  [85.3 kg] 85.3 kg (01/20 0801) Last BM Date : 03/23/24  Intake/Output from previous day: 01/19 0701 - 01/20 0700 In: 200 [P.O.:200] Out: -  Intake/Output this shift: No intake/output data recorded.  PE: General: Pleasant female who is laying in bed in NAD. Lungs: Respiratory effort nonlabored on room air.  Abd: Soft, mildly distended, focally tender in RLQ. Psych: A&Ox3 with an appropriate affect.    Lab Results:  Recent Labs    03/23/24 0415 03/24/24 0520  WBC 17.8* 16.1*  HGB 12.3 12.7  HCT 37.5 38.5  PLT 517* 574*   BMET Recent Labs    03/23/24 0415 03/24/24 0520  NA 136 138  K 4.0 3.7  CL 100 98  CO2 27 29  GLUCOSE 105* 88  BUN 6* 7*  CREATININE 0.84 0.85  CALCIUM 8.7* 9.2   PT/INR No results for input(s): LABPROT, INR in the last 72 hours. CMP     Component Value Date/Time   NA 138 03/24/2024 0520   NA 138 01/24/2024 0949   K 3.7 03/24/2024 0520   CL 98 03/24/2024 0520   CO2 29 03/24/2024 0520   GLUCOSE 88 03/24/2024 0520   BUN 7 (L) 03/24/2024 0520   BUN 16 01/24/2024 0949   CREATININE 0.85 03/24/2024 0520   CALCIUM 9.2 03/24/2024 0520   PROT 7.8 03/16/2024 1513   PROT 7.3 01/24/2024 0949   ALBUMIN 3.6 03/16/2024 1513   ALBUMIN 3.9 01/24/2024 0949   AST 18 03/16/2024 1513   ALT 14 03/16/2024 1513   ALKPHOS 106 03/16/2024 1513   BILITOT 1.3 (H) 03/16/2024 1513   BILITOT 0.6 01/24/2024 0949   GFRNONAA >60 03/24/2024 0520   GFRAA 86 05/28/2019 1217   Lipase     Component Value Date/Time   LIPASE 20 03/16/2024 1513        Studies/Results: No results found.  Anti-infectives: Anti-infectives (From admission, onward)    Start     Dose/Rate Route Frequency Ordered Stop   03/17/24 0200  piperacillin -tazobactam (ZOSYN ) IVPB 3.375 g        3.375 g 12.5 mL/hr over 240 Minutes Intravenous Every 8 hours 03/16/24 1928 03/24/24 0029   03/16/24 1730  piperacillin -tazobactam (ZOSYN ) IVPB 3.375 g        3.375 g 100 mL/hr over 30 Minutes Intravenous  Once 03/16/24 1719 03/16/24 1809        Assessment/Plan acute perforated appendicitis - Patient has had persistent symptoms without significant clinical improvement on antibiotics. No drainable well-formed abscess on imaging. Will proceed with laparoscopy, washout, and appendectomy if able to identify the appendix. I discussed the possibility of a laparotomy and ileocecectomy. The patient expressed understanding and consents to proceed with surgery. All questions were answered.    LOS: 6 days   I reviewed nursing notes, last 24 h vitals and pain scores, last 48 h intake and output, last 24 h labs and trends, and last 24 h imaging results.   Leonor LITTIE Dawn, MD  [facility] 03/24/2024, 8:20 AM Please see Amion for pager number during day  hours 7:00am-4:30pm  "

## 2024-03-24 NOTE — Anesthesia Procedure Notes (Signed)
 Procedure Name: Intubation Date/Time: 03/24/2024 8:47 AM  Performed by: Laverda Burnard LABOR, CRNAPre-anesthesia Checklist: Patient identified, Emergency Drugs available, Suction available and Patient being monitored Patient Re-evaluated:Patient Re-evaluated prior to induction Oxygen Delivery Method: Circle system utilized Preoxygenation: Pre-oxygenation with 100% oxygen Induction Type: IV induction Ventilation: Mask ventilation without difficulty Laryngoscope Size: Miller and 3 Grade View: Grade I Tube type: Oral Tube size: 7.0 mm Number of attempts: 1 Airway Equipment and Method: Stylet and Oral airway Placement Confirmation: ETT inserted through vocal cords under direct vision, positive ETCO2 and breath sounds checked- equal and bilateral Secured at: 21 cm Tube secured with: Tape Dental Injury: Teeth and Oropharynx as per pre-operative assessment

## 2024-03-25 ENCOUNTER — Encounter (HOSPITAL_COMMUNITY): Payer: Self-pay | Admitting: Surgery

## 2024-03-25 LAB — CBC
HCT: 38 % (ref 36.0–46.0)
Hemoglobin: 12.5 g/dL (ref 12.0–15.0)
MCH: 30 pg (ref 26.0–34.0)
MCHC: 32.9 g/dL (ref 30.0–36.0)
MCV: 91.1 fL (ref 80.0–100.0)
Platelets: 536 K/uL — ABNORMAL HIGH (ref 150–400)
RBC: 4.17 MIL/uL (ref 3.87–5.11)
RDW: 13.7 % (ref 11.5–15.5)
WBC: 23.1 K/uL — ABNORMAL HIGH (ref 4.0–10.5)
nRBC: 0 % (ref 0.0–0.2)

## 2024-03-25 LAB — BASIC METABOLIC PANEL WITH GFR
Anion gap: 10 (ref 5–15)
BUN: 8 mg/dL (ref 8–23)
CO2: 31 mmol/L (ref 22–32)
Calcium: 8.9 mg/dL (ref 8.9–10.3)
Chloride: 96 mmol/L — ABNORMAL LOW (ref 98–111)
Creatinine, Ser: 0.93 mg/dL (ref 0.44–1.00)
GFR, Estimated: >60 mL/min
Glucose, Bld: 100 mg/dL — ABNORMAL HIGH (ref 70–99)
Potassium: 4.7 mmol/L (ref 3.5–5.1)
Sodium: 136 mmol/L (ref 135–145)

## 2024-03-25 LAB — GLUCOSE, CAPILLARY
Glucose-Capillary: 96 mg/dL (ref 70–99)
Glucose-Capillary: 138 mg/dL — ABNORMAL HIGH (ref 70–99)
Glucose-Capillary: 150 mg/dL — ABNORMAL HIGH (ref 70–99)
Glucose-Capillary: 85 mg/dL (ref 70–99)

## 2024-03-25 MED ORDER — KETOROLAC TROMETHAMINE 15 MG/ML IJ SOLN
15.0000 mg | Freq: Four times a day (QID) | INTRAMUSCULAR | Status: DC
  Administered 2024-03-25 – 2024-03-26 (×5): 15 mg via INTRAVENOUS
  Filled 2024-03-25 (×5): qty 1

## 2024-03-25 NOTE — Plan of Care (Signed)
" °  Problem: Education: Goal: Knowledge of General Education information will improve Description: Including pain rating scale, medication(s)/side effects and non-pharmacologic comfort measures Outcome: Progressing   Problem: Health Behavior/Discharge Planning: Goal: Ability to manage health-related needs will improve Outcome: Progressing   Problem: Clinical Measurements: Goal: Ability to maintain clinical measurements within normal limits will improve Outcome: Progressing Goal: Will remain free from infection Outcome: Progressing Goal: Diagnostic test results will improve Outcome: Progressing Goal: Respiratory complications will improve Outcome: Progressing Goal: Cardiovascular complication will be avoided Outcome: Progressing   Problem: Activity: Goal: Risk for activity intolerance will decrease Outcome: Progressing   Problem: Nutrition: Goal: Adequate nutrition will be maintained Outcome: Progressing   Problem: Coping: Goal: Level of anxiety will decrease Outcome: Progressing   Problem: Elimination: Goal: Will not experience complications related to bowel motility Outcome: Progressing Goal: Will not experience complications related to urinary retention Outcome: Progressing   Problem: Pain Managment: Goal: General experience of comfort will improve and/or be controlled Outcome: Progressing   Problem: Safety: Goal: Ability to remain free from injury will improve Outcome: Progressing   Problem: Skin Integrity: Goal: Risk for impaired skin integrity will decrease Outcome: Progressing   Problem: Education: Goal: Ability to describe self-care measures that may prevent or decrease complications (Diabetes Survival Skills Education) will improve Outcome: Progressing Goal: Individualized Educational Video(s) Outcome: Progressing   Problem: Coping: Goal: Ability to adjust to condition or change in health will improve Outcome: Progressing   Problem: Fluid  Volume: Goal: Ability to maintain a balanced intake and output will improve Outcome: Progressing   Problem: Health Behavior/Discharge Planning: Goal: Ability to identify and utilize available resources and services will improve Outcome: Progressing Goal: Ability to manage health-related needs will improve Outcome: Progressing   Problem: Metabolic: Goal: Ability to maintain appropriate glucose levels will improve Outcome: Progressing   Problem: Nutritional: Goal: Maintenance of adequate nutrition will improve Outcome: Progressing Goal: Progress toward achieving an optimal weight will improve Outcome: Progressing   Problem: Skin Integrity: Goal: Risk for impaired skin integrity will decrease Outcome: Progressing   Problem: Tissue Perfusion: Goal: Adequacy of tissue perfusion will improve Outcome: Progressing   Problem: Education: Goal: Knowledge of the prescribed therapeutic regimen will improve Outcome: Progressing   Problem: Bowel/Gastric: Goal: Gastrointestinal status for postoperative course will improve Outcome: Progressing   Problem: Cardiac: Goal: Ability to maintain an adequate cardiac output Outcome: Progressing Goal: Will show no evidence of cardiac arrhythmias Outcome: Progressing   Problem: Nutritional: Goal: Will attain and maintain optimal nutritional status Outcome: Progressing   Problem: Neurological: Goal: Will regain or maintain usual level of consciousness Outcome: Progressing   Problem: Clinical Measurements: Goal: Ability to maintain clinical measurements within normal limits Outcome: Progressing Goal: Postoperative complications will be avoided or minimized Outcome: Progressing   Problem: Respiratory: Goal: Will regain and/or maintain adequate ventilation Outcome: Progressing Goal: Respiratory status will improve Outcome: Progressing   Problem: Skin Integrity: Goal: Demonstrates signs of wound healing without infection Outcome:  Progressing   Problem: Urinary Elimination: Goal: Will remain free from infection Outcome: Progressing Goal: Ability to achieve and maintain adequate urine output Outcome: Progressing   "

## 2024-03-25 NOTE — TOC Progression Note (Signed)
 Transition of Care [facility]) - Progression Note    Patient Details  Name: Andrea Pittman MRN: [id number] Date of Birth: [birth year]  [facility]) CM/SW Contact  Roxie KANDICE Stain, RN Phone Number: 03/25/2024, 12:06 PM  Clinical Narrative:     Patient has  Laparoscopic appendectomy on 03/24/24. ICM will continue to follow.   Expected Discharge Plan: Home/Self Care Barriers to Discharge: Continued Medical Work up               Expected Discharge Plan and Services       Living arrangements for the past 2 months: Single Family Home                                       Social Drivers of Health (SDOH) Interventions SDOH Screenings   Food Insecurity: No Food Insecurity (03/17/2024)  Housing: Low Risk (03/17/2024)  Transportation Needs: No Transportation Needs (03/17/2024)  Utilities: Not At Risk (03/17/2024)  Alcohol Screen: Low Risk (09/16/2023)  Depression (PHQ2-9): Medium Risk (01/24/2024)  Financial Resource Strain: Low Risk (01/23/2024)  Physical Activity: Unknown (01/23/2024)  Social Connections: Socially Integrated (03/17/2024)  Stress: No Stress Concern Present (01/23/2024)  Tobacco Use: Low Risk (03/24/2024)  Health Literacy: Adequate Health Literacy (09/16/2023)    Readmission Risk Interventions     No data to display

## 2024-03-25 NOTE — Anesthesia Postprocedure Evaluation (Signed)
"   Anesthesia Post Note  Patient: Andrea Pittman  Procedure(s) Performed: LAPAROSCOPY, DIAGNOSTIC with appendectomy, lysis of adhesions     Patient location during evaluation: PACU Anesthesia Type: General Level of consciousness: awake and alert Pain management: pain level controlled Vital Signs Assessment: post-procedure vital signs reviewed and stable Respiratory status: spontaneous breathing, nonlabored ventilation, respiratory function stable and patient connected to nasal cannula oxygen Cardiovascular status: blood pressure returned to baseline and stable Postop Assessment: no apparent nausea or vomiting Anesthetic complications: no   No notable events documented.  Last Vitals:  Vitals:   03/25/24 0357 03/25/24 0719  BP: (!) 115/59 (!) 115/58  Pulse: 87 87  Resp: 20 16  Temp: (!) 36.3 C 36.6 C  SpO2: 96% 95%    Last Pain:  Vitals:   03/25/24 1201  TempSrc:   PainSc: 8                  Garnette DELENA Gab      "

## 2024-03-25 NOTE — Progress Notes (Signed)
 "  Progress Note  1 Day Post-Op  Subjective: Reports feeling overall better, but still having abdominal pain/soreness more severe in RLQ. Denies nausea or vomiting. Tolerating sprite and water. No flatus yet. Foley in place.   Objective: Vital signs in last 24 hours: Temp:  [97.3 F (36.3 C)-99.3 F (37.4 C)] 97.9 F (36.6 C) (01/21 0719) Pulse Rate:  [87-115] 87 (01/21 0719) Resp:  [13-20] 16 (01/21 0719) BP: (115-159)/(58-83) 115/58 (01/21 0719) SpO2:  [91 %-97 %] 95 % (01/21 0719) Last BM Date : 03/23/24  Intake/Output from previous day: 01/20 0701 - 01/21 0700 In: 1755.4 [I.V.:1655.4; IV Piggyback:100] Out: 3460 [Urine:2900; Drains:510; Blood:50] Intake/Output this shift: No intake/output data recorded.  PE: General: Pleasant female who is laying in bed in NAD. Lungs: Respiratory effort nonlabored on room air.  Abd: Soft, mildly distended, focally tender in RLQ. Incision c/d/I, LLQ drain cloudy SS (510 cc) GU: foley in place draining clear yellow urine; 2.9 L  Psych: A&Ox3 with an appropriate affect.    Lab Results:  Recent Labs    03/24/24 0520 03/25/24 0449  WBC 16.1* 23.1*  HGB 12.7 12.5  HCT 38.5 38.0  PLT 574* 536*   BMET Recent Labs    03/24/24 0520 03/25/24 0449  NA 138 136  K 3.7 4.7  CL 98 96*  CO2 29 31  GLUCOSE 88 100*  BUN 7* 8  CREATININE 0.85 0.93  CALCIUM 9.2 8.9   PT/INR No results for input(s): LABPROT, INR in the last 72 hours. CMP     Component Value Date/Time   NA 136 03/25/2024 0449   NA 138 01/24/2024 0949   K 4.7 03/25/2024 0449   CL 96 (L) 03/25/2024 0449   CO2 31 03/25/2024 0449   GLUCOSE 100 (H) 03/25/2024 0449   BUN 8 03/25/2024 0449   BUN 16 01/24/2024 0949   CREATININE 0.93 03/25/2024 0449   CALCIUM 8.9 03/25/2024 0449   PROT 7.8 03/16/2024 1513   PROT 7.3 01/24/2024 0949   ALBUMIN 3.6 03/16/2024 1513   ALBUMIN 3.9 01/24/2024 0949   AST 18 03/16/2024 1513   ALT 14 03/16/2024 1513   ALKPHOS 106  03/16/2024 1513   BILITOT 1.3 (H) 03/16/2024 1513   BILITOT 0.6 01/24/2024 0949   GFRNONAA >60 03/25/2024 0449   GFRAA 86 05/28/2019 1217   Lipase     Component Value Date/Time   LIPASE 20 03/16/2024 1513       Studies/Results: No results found.  Anti-infectives: Anti-infectives (From admission, onward)    Start     Dose/Rate Route Frequency Ordered Stop   03/24/24 2200  piperacillin -tazobactam (ZOSYN ) IVPB 3.375 g        3.375 g 12.5 mL/hr over 240 Minutes Intravenous Every 8 hours 03/24/24 1833 03/27/24 2159   03/24/24 0830  piperacillin -tazobactam (ZOSYN ) IVPB 3.375 g        3.375 g 12.5 mL/hr over 240 Minutes Intravenous On call to O.R. 03/24/24 0823 03/24/24 0855   03/17/24 0200  piperacillin -tazobactam (ZOSYN ) IVPB 3.375 g        3.375 g 12.5 mL/hr over 240 Minutes Intravenous Every 8 hours 03/16/24 1928 03/24/24 0029   03/16/24 1730  piperacillin -tazobactam (ZOSYN ) IVPB 3.375 g        3.375 g 100 mL/hr over 30 Minutes Intravenous  Once 03/16/24 1719 03/16/24 1809        Assessment/Plan acute perforated appendicitis Failed non-op management POD#1 s/p Laparoscopic appendectomy, she had an intra-abdominal abscess, drain placement 1/20 Dr.  Allen  - afebrile, VSS, WBC 23 from 16 - not unexpected POD#1 and after washout of abscess, trend  - allow FLD and await return of bowel function - continue Zosyn  72 h post-op - continue blake drain - D/C foley - pain: tylenol  1000 mg QID, add toradol  16 mg q 6h, continue robaxin  500 mg TID, PRN oxy 5 mg - DVT ppx: lovenox       LOS: 7 days   I reviewed nursing notes, last 24 h vitals and pain scores, last 48 h intake and output, last 24 h labs and trends, and last 24 h imaging results.   Almarie GORMAN Pringle, [facility] Surgery 03/25/2024, 9:24 AM Please see Amion for pager number during day hours 7:00am-4:30pm  "

## 2024-03-26 ENCOUNTER — Inpatient Hospital Stay (HOSPITAL_COMMUNITY)

## 2024-03-26 LAB — CBC
HCT: 36.2 % (ref 36.0–46.0)
Hemoglobin: 12.1 g/dL (ref 12.0–15.0)
MCH: 30.3 pg (ref 26.0–34.0)
MCHC: 33.4 g/dL (ref 30.0–36.0)
MCV: 90.7 fL (ref 80.0–100.0)
Platelets: 520 K/uL — ABNORMAL HIGH (ref 150–400)
RBC: 3.99 MIL/uL (ref 3.87–5.11)
RDW: 13.8 % (ref 11.5–15.5)
WBC: 24.8 K/uL — ABNORMAL HIGH (ref 4.0–10.5)
nRBC: 0 % (ref 0.0–0.2)

## 2024-03-26 LAB — BASIC METABOLIC PANEL WITH GFR
Anion gap: 14 (ref 5–15)
BUN: 14 mg/dL (ref 8–23)
CO2: 26 mmol/L (ref 22–32)
Calcium: 8.8 mg/dL — ABNORMAL LOW (ref 8.9–10.3)
Chloride: 94 mmol/L — ABNORMAL LOW (ref 98–111)
Creatinine, Ser: 1.06 mg/dL — ABNORMAL HIGH (ref 0.44–1.00)
GFR, Estimated: 57 mL/min — ABNORMAL LOW
Glucose, Bld: 121 mg/dL — ABNORMAL HIGH (ref 70–99)
Potassium: 3.4 mmol/L — ABNORMAL LOW (ref 3.5–5.1)
Sodium: 133 mmol/L — ABNORMAL LOW (ref 135–145)

## 2024-03-26 LAB — GLUCOSE, CAPILLARY
Glucose-Capillary: 120 mg/dL — ABNORMAL HIGH (ref 70–99)
Glucose-Capillary: 119 mg/dL — ABNORMAL HIGH (ref 70–99)
Glucose-Capillary: 134 mg/dL — ABNORMAL HIGH (ref 70–99)

## 2024-03-26 LAB — SURGICAL PATHOLOGY

## 2024-03-26 MED ORDER — METHOCARBAMOL 1000 MG/10ML IJ SOLN
500.0000 mg | Freq: Three times a day (TID) | INTRAMUSCULAR | Status: DC
  Administered 2024-03-26 – 2024-04-05 (×26): 500 mg via INTRAVENOUS
  Filled 2024-03-26 (×13): qty 10
  Filled 2024-03-26: qty 5
  Filled 2024-03-26 (×9): qty 10
  Filled 2024-03-26: qty 5

## 2024-03-26 MED ORDER — SODIUM CHLORIDE 0.9 % IV BOLUS
1000.0000 mL | Freq: Once | INTRAVENOUS | Status: AC
  Administered 2024-03-26: 1000 mL via INTRAVENOUS

## 2024-03-26 MED ORDER — ACETAMINOPHEN 650 MG RE SUPP: 650.0000 mg | Freq: Four times a day (QID) | RECTAL | Status: DC | PRN

## 2024-03-26 MED ORDER — ACETAMINOPHEN 10 MG/ML IV SOLN
1000.0000 mg | Freq: Three times a day (TID) | INTRAVENOUS | Status: AC
  Administered 2024-03-26 – 2024-03-27 (×3): 1000 mg via INTRAVENOUS
  Filled 2024-03-26 (×3): qty 100

## 2024-03-26 MED ORDER — KCL IN DEXTROSE-NACL 20-5-0.45 MEQ/L-%-% IV SOLN
INTRAVENOUS | Status: DC
  Filled 2024-03-26 (×3): qty 1000

## 2024-03-26 MED ORDER — PROCHLORPERAZINE EDISYLATE 10 MG/2ML IJ SOLN
10.0000 mg | Freq: Four times a day (QID) | INTRAMUSCULAR | Status: DC | PRN
  Administered 2024-03-26 – 2024-03-30 (×2): 10 mg via INTRAVENOUS
  Filled 2024-03-26 (×2): qty 2

## 2024-03-26 NOTE — Plan of Care (Signed)
" °  Problem: Education: Goal: Knowledge of General Education information will improve Description: Including pain rating scale, medication(s)/side effects and non-pharmacologic comfort measures Outcome: Progressing   Problem: Health Behavior/Discharge Planning: Goal: Ability to manage health-related needs will improve Outcome: Progressing   Problem: Clinical Measurements: Goal: Ability to maintain clinical measurements within normal limits will improve Outcome: Progressing Goal: Will remain free from infection Outcome: Progressing Goal: Diagnostic test results will improve Outcome: Progressing Goal: Respiratory complications will improve Outcome: Progressing Goal: Cardiovascular complication will be avoided Outcome: Progressing   Problem: Activity: Goal: Risk for activity intolerance will decrease Outcome: Progressing   Problem: Nutrition: Goal: Adequate nutrition will be maintained Outcome: Progressing   Problem: Coping: Goal: Level of anxiety will decrease Outcome: Progressing   Problem: Elimination: Goal: Will not experience complications related to bowel motility Outcome: Progressing Goal: Will not experience complications related to urinary retention Outcome: Progressing   Problem: Pain Managment: Goal: General experience of comfort will improve and/or be controlled Outcome: Progressing   Problem: Safety: Goal: Ability to remain free from injury will improve Outcome: Progressing   Problem: Skin Integrity: Goal: Risk for impaired skin integrity will decrease Outcome: Progressing   Problem: Education: Goal: Ability to describe self-care measures that may prevent or decrease complications (Diabetes Survival Skills Education) will improve Outcome: Progressing Goal: Individualized Educational Video(s) Outcome: Progressing   Problem: Coping: Goal: Ability to adjust to condition or change in health will improve Outcome: Progressing   Problem: Fluid  Volume: Goal: Ability to maintain a balanced intake and output will improve Outcome: Progressing   Problem: Health Behavior/Discharge Planning: Goal: Ability to identify and utilize available resources and services will improve Outcome: Progressing Goal: Ability to manage health-related needs will improve Outcome: Progressing   Problem: Metabolic: Goal: Ability to maintain appropriate glucose levels will improve Outcome: Progressing   Problem: Nutritional: Goal: Maintenance of adequate nutrition will improve Outcome: Progressing Goal: Progress toward achieving an optimal weight will improve Outcome: Progressing   Problem: Skin Integrity: Goal: Risk for impaired skin integrity will decrease Outcome: Progressing   Problem: Tissue Perfusion: Goal: Adequacy of tissue perfusion will improve Outcome: Progressing   Problem: Education: Goal: Knowledge of the prescribed therapeutic regimen will improve Outcome: Progressing   Problem: Bowel/Gastric: Goal: Gastrointestinal status for postoperative course will improve Outcome: Progressing   Problem: Cardiac: Goal: Ability to maintain an adequate cardiac output Outcome: Progressing Goal: Will show no evidence of cardiac arrhythmias Outcome: Progressing   Problem: Nutritional: Goal: Will attain and maintain optimal nutritional status Outcome: Progressing   Problem: Neurological: Goal: Will regain or maintain usual level of consciousness Outcome: Progressing   Problem: Clinical Measurements: Goal: Ability to maintain clinical measurements within normal limits Outcome: Progressing Goal: Postoperative complications will be avoided or minimized Outcome: Progressing   Problem: Respiratory: Goal: Will regain and/or maintain adequate ventilation Outcome: Progressing Goal: Respiratory status will improve Outcome: Progressing   Problem: Skin Integrity: Goal: Demonstrates signs of wound healing without infection Outcome:  Progressing   Problem: Urinary Elimination: Goal: Will remain free from infection Outcome: Progressing Goal: Ability to achieve and maintain adequate urine output Outcome: Progressing   "

## 2024-03-26 NOTE — Plan of Care (Signed)
 Patient calm and cooperative A&O X4, medications tolerated well. Patient left with call bell in reach and bed in lowest position.  Problem: Education: Goal: Knowledge of General Education information will improve Description: Including pain rating scale, medication(s)/side effects and non-pharmacologic comfort measures Outcome: Progressing   Problem: Clinical Measurements: Goal: Ability to maintain clinical measurements within normal limits will improve Outcome: Progressing Goal: Respiratory complications will improve Outcome: Progressing   Problem: Activity: Goal: Risk for activity intolerance will decrease Outcome: Progressing   Problem: Nutrition: Goal: Adequate nutrition will be maintained Outcome: Progressing   Problem: Coping: Goal: Level of anxiety will decrease Outcome: Progressing   Problem: Pain Managment: Goal: General experience of comfort will improve and/or be controlled Outcome: Progressing   Problem: Safety: Goal: Ability to remain free from injury will improve Outcome: Progressing   Problem: Education: Goal: Ability to describe self-care measures that may prevent or decrease complications (Diabetes Survival Skills Education) will improve Outcome: Progressing   Problem: Coping: Goal: Ability to adjust to condition or change in health will improve Outcome: Progressing   Problem: Health Behavior/Discharge Planning: Goal: Ability to identify and utilize available resources and services will improve Outcome: Progressing   Problem: Nutritional: Goal: Maintenance of adequate nutrition will improve Outcome: Progressing   Problem: Education: Goal: Knowledge of the prescribed therapeutic regimen will improve Outcome: Progressing

## 2024-03-26 NOTE — Discharge Instructions (Signed)
 You were hospitalized for because your gallbladder was inflamed. We got your gallbladder taken out Thank you for allowing us  to be part of your care.   Your appointment is with Dr. Azadegan on 12/19 at 10:15 am. Please go to this appointment  Please note these changes made to your medications:  *Please START taking:  We are starting you on jardiance  10 mg. Please start taking this tomorrow(12/14) and take only one tablet a day   We are also giving you some medication that you can take over the next couple of days for pain. Please take this for severe pain. Make sure you are still having regular bowel movements with it.   Please call our clinic if you have any questions or concerns, we may be able to help and keep you from a long and expensive emergency room wait. Our clinic and after hours phone number is [phone], the best time to call is Monday through Friday 9 am to 4 pm but there is always someone available 24/7 if you have an emergency. If you need medication refills please notify your pharmacy one week in advance and they will send us  a request.      LAPAROSCOPIC SURGERY: POST OP INSTRUCTIONS Always review your discharge instruction sheet given to you by the facility where your surgery was performed. IF YOU HAVE DISABILITY OR FAMILY LEAVE FORMS, YOU MUST BRING THEM TO THE OFFICE FOR PROCESSING.   DO NOT GIVE THEM TO YOUR DOCTOR.  PAIN CONTROL  First take acetaminophen  (Tylenol ) AND/or ibuprofen (Advil) to control your pain after surgery.  Follow directions on package.  Taking acetaminophen  (Tylenol ) and/or ibuprofen (Advil) regularly after surgery will help to control your pain and lower the amount of prescription pain medication you may need.  You should not take more than 3,000 mg (3 grams) of acetaminophen  (Tylenol ) in 24 hours.  You should not take ibuprofen (Advil), aleve, motrin, naprosyn or other NSAIDS if you have a history of stomach ulcers or chronic kidney disease.  A  prescription for pain medication may be given to you upon discharge.  Take your pain medication as prescribed, if you still have uncontrolled pain after taking acetaminophen  (Tylenol ) or ibuprofen (Advil). Use ice packs to help control pain. If you need a refill on your pain medication, please contact your pharmacy.  They will contact our office to request authorization. Prescriptions will not be filled after 5pm or on week-ends.  HOME MEDICATIONS Take your usually prescribed medications unless otherwise directed.  DIET You should follow a light diet the first few days after arrival home.  Be sure to include lots of fluids daily. Avoid fatty, fried foods.   CONSTIPATION It is common to experience some constipation after surgery and if you are taking pain medication.  Increasing fluid intake and taking a stool softener (such as Colace) will usually help or prevent this problem from occurring.  A mild laxative (Milk of Magnesia or Miralax ) should be taken according to package instructions if there are no bowel movements after 48 hours.  WOUND/INCISION CARE Most patients will experience some swelling and bruising in the area of the incisions.  Ice packs will help.  Swelling and bruising can take several days to resolve.  Unless discharge instructions indicate otherwise, follow guidelines below  STERI-STRIPS - you may remove your outer bandages 48 hours after surgery, and you may shower at that time.  You have steri-strips (small skin tapes) in place directly over the incision.  These strips should  be left on the skin for 7-10 days.   DERMABOND/SKIN GLUE - you may shower in 24 hours.  The glue will flake off over the next 2-3 weeks. Any sutures or staples will be removed at the office during your follow-up visit.  ACTIVITIES You may resume regular (light) daily activities beginning the next day--such as daily self-care, walking, climbing stairs--gradually increasing activities as tolerated.  You may  have sexual intercourse when it is comfortable.  Refrain from any heavy lifting or straining until approved by your doctor. You may drive when you are no longer taking prescription pain medication, you can comfortably wear a seatbelt, and you can safely maneuver your car and apply brakes.  FOLLOW-UP You should see your doctor in the office for a follow-up appointment approximately 2-3 weeks after your surgery.  You should have been given your post-op/follow-up appointment when your surgery was scheduled.  If you did not receive a post-op/follow-up appointment, make sure that you call for this appointment within a day or two after you arrive home to insure a convenient appointment time.  OTHER INSTRUCTIONS  WHEN TO CALL YOUR DOCTOR: Fever over 101.0 Inability to urinate Continued bleeding from incision. Increased pain, redness, or drainage from the incision. Increasing abdominal pain  The clinic staff is available to answer your questions during regular business hours.  Please dont hesitate to call and ask to speak to one of the nurses for clinical concerns.  If you have a medical emergency, go to the nearest emergency room or call 911.  A surgeon from [facility] is always on call at the hospital. [address], [address], [place], [place]  [address] ? [address] [address], [place], [place]   [address] [phone] ? [phone] ? FAX [phone] Web site: [email or web address]

## 2024-03-26 NOTE — Progress Notes (Signed)
 "  Progress Note  2 Days Post-Op  Subjective: Nauseated this morning, has not had anything to drink other than to take meds. Reports abdominal distention. No flatus or BM yet. Voiding without reported sxs.   Objective: Vital signs in last 24 hours: Temp:  [97.6 F (36.4 C)-98.3 F (36.8 C)] 98.3 F (36.8 C) (01/22 0711) Pulse Rate:  [98-102] 99 (01/22 0711) Resp:  [16-20] 16 (01/22 0711) BP: (108-131)/(68-78) 131/75 (01/22 0711) SpO2:  [92 %-97 %] 92 % (01/22 0711) Last BM Date : 03/23/24  Intake/Output from previous day: 01/21 0701 - 01/22 0700 In: 814.1 [I.V.:677.3; IV Piggyback:136.8] Out: 300 [Urine:100; Drains:200] Intake/Output this shift: No intake/output data recorded.  PE: General: Pleasant female who is laying in bed, appears uncomfortable, hand over face c/o nausea Lungs: Respiratory effort nonlabored on room air.  Abd: Soft, moderate distention with tympany,  Incision c/d/I, LLQ drain cloudy SS (200 cc) Psych: A&Ox3 with an appropriate affect.    Lab Results:  Recent Labs    03/25/24 0449 03/26/24 0430  WBC 23.1* 24.8*  HGB 12.5 12.1  HCT 38.0 36.2  PLT 536* 520*   BMET Recent Labs    03/25/24 0449 03/26/24 0430  NA 136 133*  K 4.7 3.4*  CL 96* 94*  CO2 31 26  GLUCOSE 100* 121*  BUN 8 14  CREATININE 0.93 1.06*  CALCIUM 8.9 8.8*   PT/INR No results for input(s): LABPROT, INR in the last 72 hours. CMP     Component Value Date/Time   NA 133 (L) 03/26/2024 0430   NA 138 01/24/2024 0949   K 3.4 (L) 03/26/2024 0430   CL 94 (L) 03/26/2024 0430   CO2 26 03/26/2024 0430   GLUCOSE 121 (H) 03/26/2024 0430   BUN 14 03/26/2024 0430   BUN 16 01/24/2024 0949   CREATININE 1.06 (H) 03/26/2024 0430   CALCIUM 8.8 (L) 03/26/2024 0430   PROT 7.8 03/16/2024 1513   PROT 7.3 01/24/2024 0949   ALBUMIN 3.6 03/16/2024 1513   ALBUMIN 3.9 01/24/2024 0949   AST 18 03/16/2024 1513   ALT 14 03/16/2024 1513   ALKPHOS 106 03/16/2024 1513   BILITOT 1.3 (H)  03/16/2024 1513   BILITOT 0.6 01/24/2024 0949   GFRNONAA 57 (L) 03/26/2024 0430   GFRAA 86 05/28/2019 1217   Lipase     Component Value Date/Time   LIPASE 20 03/16/2024 1513       Studies/Results: No results found.  Anti-infectives: Anti-infectives (From admission, onward)    Start     Dose/Rate Route Frequency Ordered Stop   03/24/24 2200  piperacillin -tazobactam (ZOSYN ) IVPB 3.375 g        3.375 g 12.5 mL/hr over 240 Minutes Intravenous Every 8 hours 03/24/24 1833 03/27/24 2159   03/24/24 0830  piperacillin -tazobactam (ZOSYN ) IVPB 3.375 g        3.375 g 12.5 mL/hr over 240 Minutes Intravenous On call to O.R. 03/24/24 0823 03/24/24 0855   03/17/24 0200  piperacillin -tazobactam (ZOSYN ) IVPB 3.375 g        3.375 g 12.5 mL/hr over 240 Minutes Intravenous Every 8 hours 03/16/24 1928 03/24/24 0029   03/16/24 1730  piperacillin -tazobactam (ZOSYN ) IVPB 3.375 g        3.375 g 100 mL/hr over 30 Minutes Intravenous  Once 03/16/24 1719 03/16/24 1809        Assessment/Plan acute perforated appendicitis Failed non-op management POD#2 s/p Laparoscopic appendectomy, she had an intra-abdominal abscess, drain placement 1/20 Dr. Dasie  - afebrile,  VSS, WBC 24 from 23 - trend - clinically she has an ileus, confirmed by KUB. Place NGT to LIWS. Pain meds transitioned to IV - NPO, ice chips and gum/hard candy ok.  - continue Zosyn  72 h post-op (stop time 1/23 PM) - continue blake drain - DVT ppx: lovenox       LOS: 8 days   I reviewed nursing notes, last 24 h vitals and pain scores, last 48 h intake and output, last 24 h labs and trends, and last 24 h imaging results.   Andrea Pittman, [facility] 03/26/2024, 10:33 AM Please see Amion for pager number during day hours 7:00am-4:30pm  "

## 2024-03-27 LAB — CBC
HCT: 36.5 % (ref 36.0–46.0)
Hemoglobin: 12 g/dL (ref 12.0–15.0)
MCH: 29.9 pg (ref 26.0–34.0)
MCHC: 32.9 g/dL (ref 30.0–36.0)
MCV: 91 fL (ref 80.0–100.0)
Platelets: 592 K/uL — ABNORMAL HIGH (ref 150–400)
RBC: 4.01 MIL/uL (ref 3.87–5.11)
RDW: 13.7 % (ref 11.5–15.5)
WBC: 25.3 K/uL — ABNORMAL HIGH (ref 4.0–10.5)
nRBC: 0 % (ref 0.0–0.2)

## 2024-03-27 LAB — BASIC METABOLIC PANEL WITH GFR
Anion gap: 10 (ref 5–15)
BUN: 9 mg/dL (ref 8–23)
CO2: 29 mmol/L (ref 22–32)
Calcium: 8.8 mg/dL — ABNORMAL LOW (ref 8.9–10.3)
Chloride: 97 mmol/L — ABNORMAL LOW (ref 98–111)
Creatinine, Ser: 0.91 mg/dL (ref 0.44–1.00)
GFR, Estimated: >60 mL/min
Glucose, Bld: 130 mg/dL — ABNORMAL HIGH (ref 70–99)
Potassium: 4 mmol/L (ref 3.5–5.1)
Sodium: 135 mmol/L (ref 135–145)

## 2024-03-27 LAB — GLUCOSE, CAPILLARY
Glucose-Capillary: 140 mg/dL — ABNORMAL HIGH (ref 70–99)
Glucose-Capillary: 110 mg/dL — ABNORMAL HIGH (ref 70–99)
Glucose-Capillary: 115 mg/dL — ABNORMAL HIGH (ref 70–99)
Glucose-Capillary: 119 mg/dL — ABNORMAL HIGH (ref 70–99)

## 2024-03-27 LAB — MAGNESIUM: Magnesium: 1.9 mg/dL (ref 1.7–2.4)

## 2024-03-27 MED ORDER — ACETAMINOPHEN 10 MG/ML IV SOLN
1000.0000 mg | Freq: Three times a day (TID) | INTRAVENOUS | Status: AC
  Administered 2024-03-27 (×2): 1000 mg via INTRAVENOUS
  Filled 2024-03-27 (×2): qty 100

## 2024-03-27 MED ORDER — MAGNESIUM SULFATE 2 GM/50ML IV SOLN
2.0000 g | Freq: Once | INTRAVENOUS | Status: AC
  Administered 2024-03-27: 2 g via INTRAVENOUS
  Filled 2024-03-27: qty 50

## 2024-03-27 MED ORDER — KCL IN DEXTROSE-NACL 20-5-0.45 MEQ/L-%-% IV SOLN
INTRAVENOUS | Status: DC
  Filled 2024-03-27 (×11): qty 1000

## 2024-03-27 MED ORDER — PIPERACILLIN-TAZOBACTAM 3.375 G IVPB
3.3750 g | Freq: Three times a day (TID) | INTRAVENOUS | Status: DC
  Administered 2024-03-27 – 2024-04-07 (×33): 3.375 g via INTRAVENOUS
  Filled 2024-03-27 (×33): qty 50

## 2024-03-27 MED ORDER — SIMETHICONE 40 MG/0.6ML PO SUSP
40.0000 mg | Freq: Four times a day (QID) | ORAL | Status: DC | PRN
  Administered 2024-03-27 – 2024-03-28 (×4): 40 mg via ORAL
  Filled 2024-03-27 (×7): qty 0.6

## 2024-03-27 NOTE — Plan of Care (Signed)
" °  Problem: Education: Goal: Knowledge of General Education information will improve Description: Including pain rating scale, medication(s)/side effects and non-pharmacologic comfort measures Outcome: Progressing   Problem: Health Behavior/Discharge Planning: Goal: Ability to manage health-related needs will improve Outcome: Progressing   Problem: Clinical Measurements: Goal: Ability to maintain clinical measurements within normal limits will improve Outcome: Progressing Goal: Will remain free from infection Outcome: Progressing Goal: Diagnostic test results will improve Outcome: Progressing Goal: Respiratory complications will improve Outcome: Progressing Goal: Cardiovascular complication will be avoided Outcome: Progressing   Problem: Activity: Goal: Risk for activity intolerance will decrease Outcome: Progressing   Problem: Nutrition: Goal: Adequate nutrition will be maintained Outcome: Progressing   Problem: Coping: Goal: Level of anxiety will decrease Outcome: Progressing   Problem: Elimination: Goal: Will not experience complications related to bowel motility Outcome: Progressing Goal: Will not experience complications related to urinary retention Outcome: Progressing   Problem: Pain Managment: Goal: General experience of comfort will improve and/or be controlled Outcome: Progressing   Problem: Safety: Goal: Ability to remain free from injury will improve Outcome: Progressing   Problem: Skin Integrity: Goal: Risk for impaired skin integrity will decrease Outcome: Progressing   Problem: Education: Goal: Ability to describe self-care measures that may prevent or decrease complications (Diabetes Survival Skills Education) will improve Outcome: Progressing Goal: Individualized Educational Video(s) Outcome: Progressing   Problem: Coping: Goal: Ability to adjust to condition or change in health will improve Outcome: Progressing   Problem: Fluid  Volume: Goal: Ability to maintain a balanced intake and output will improve Outcome: Progressing   Problem: Health Behavior/Discharge Planning: Goal: Ability to identify and utilize available resources and services will improve Outcome: Progressing Goal: Ability to manage health-related needs will improve Outcome: Progressing   Problem: Metabolic: Goal: Ability to maintain appropriate glucose levels will improve Outcome: Progressing   Problem: Nutritional: Goal: Maintenance of adequate nutrition will improve Outcome: Progressing Goal: Progress toward achieving an optimal weight will improve Outcome: Progressing   Problem: Skin Integrity: Goal: Risk for impaired skin integrity will decrease Outcome: Progressing   Problem: Tissue Perfusion: Goal: Adequacy of tissue perfusion will improve Outcome: Progressing   Problem: Education: Goal: Knowledge of the prescribed therapeutic regimen will improve Outcome: Progressing   Problem: Bowel/Gastric: Goal: Gastrointestinal status for postoperative course will improve Outcome: Progressing   Problem: Cardiac: Goal: Ability to maintain an adequate cardiac output Outcome: Progressing Goal: Will show no evidence of cardiac arrhythmias Outcome: Progressing   Problem: Nutritional: Goal: Will attain and maintain optimal nutritional status Outcome: Progressing   Problem: Neurological: Goal: Will regain or maintain usual level of consciousness Outcome: Progressing   Problem: Clinical Measurements: Goal: Ability to maintain clinical measurements within normal limits Outcome: Progressing Goal: Postoperative complications will be avoided or minimized Outcome: Progressing   Problem: Respiratory: Goal: Will regain and/or maintain adequate ventilation Outcome: Progressing Goal: Respiratory status will improve Outcome: Progressing   Problem: Skin Integrity: Goal: Demonstrates signs of wound healing without infection Outcome:  Progressing   Problem: Urinary Elimination: Goal: Will remain free from infection Outcome: Progressing Goal: Ability to achieve and maintain adequate urine output Outcome: Progressing   "

## 2024-03-27 NOTE — Progress Notes (Signed)
 "  Progress Note  3 Days Post-Op  Subjective: NGT placed yesterday for vomiting and ileus.  2L of output yesterday and 600cc so far today.  Small flatus and small BM overnight  Objective: Vital signs in last 24 hours: Temp:  [97.8 F (36.6 C)-98.8 F (37.1 C)] 98.2 F (36.8 C) (01/23 0759) Pulse Rate:  [87-110] 87 (01/23 0759) Resp:  [14-16] 14 (01/23 0759) BP: (117-141)/(69-84) 141/84 (01/23 0759) SpO2:  [91 %-96 %] 91 % (01/23 0759) Last BM Date : 03/21/24  Intake/Output from previous day: 01/22 0701 - 01/23 0700 In: -  Out: 2065 [Emesis/NG output:2025; Drains:40] Intake/Output this shift: Total I/O In: 0  Out: 580 [Emesis/NG output:550; Drains:30]  PE:  Abd: Soft, mild distention, NGT in place with bilious output, incisions c/d/I, JP with minimal serosang output.   Lab Results:  Recent Labs    03/26/24 0430 03/27/24 0446  WBC 24.8* 25.3*  HGB 12.1 12.0  HCT 36.2 36.5  PLT 520* 592*   BMET Recent Labs    03/26/24 0430 03/27/24 0446  NA 133* 135  K 3.4* 4.0  CL 94* 97*  CO2 26 29  GLUCOSE 121* 130*  BUN 14 9  CREATININE 1.06* 0.91  CALCIUM 8.8* 8.8*   PT/INR No results for input(s): LABPROT, INR in the last 72 hours. CMP     Component Value Date/Time   NA 135 03/27/2024 0446   NA 138 01/24/2024 0949   K 4.0 03/27/2024 0446   CL 97 (L) 03/27/2024 0446   CO2 29 03/27/2024 0446   GLUCOSE 130 (H) 03/27/2024 0446   BUN 9 03/27/2024 0446   BUN 16 01/24/2024 0949   CREATININE 0.91 03/27/2024 0446   CALCIUM 8.8 (L) 03/27/2024 0446   PROT 7.8 03/16/2024 1513   PROT 7.3 01/24/2024 0949   ALBUMIN 3.6 03/16/2024 1513   ALBUMIN 3.9 01/24/2024 0949   AST 18 03/16/2024 1513   ALT 14 03/16/2024 1513   ALKPHOS 106 03/16/2024 1513   BILITOT 1.3 (H) 03/16/2024 1513   BILITOT 0.6 01/24/2024 0949   GFRNONAA >60 03/27/2024 0446   GFRAA 86 05/28/2019 1217   Lipase     Component Value Date/Time   LIPASE 20 03/16/2024 1513        Studies/Results: DG Abd Portable 1V Result Date: 03/26/2024 CLINICAL DATA:  NG placement. EXAM: PORTABLE ABDOMEN - 1 VIEW COMPARISON:  Abdominal radiograph dated 03/26/2024. FINDINGS: Enteric tube with tip in the distal stomach. Persistent dilatation of the small bowel loops measure up to 4.5 cm in caliber. Right upper quadrant cholecystectomy clips. No acute osseous pathology. IMPRESSION: Enteric tube with tip in the distal stomach. Persistent small bowel dilatation. Electronically Signed   By: Vanetta Chou M.D.   On: 03/26/2024 14:25   DG Abd Portable 1V Result Date: 03/26/2024 EXAM: 1 VIEW XRAY OF THE ABDOMEN 03/26/2024 10:10:00 AM COMPARISON: None available. CLINICAL HISTORY: Ileus. ICD10: [id number] Ileus (HCC). FINDINGS: BOWEL: Multiple segments of gas filled and dilated small bowel segments in the mid to upper abdomen. No significant gas in the lower abdomen or colon. SOFT TISSUES: Right upper quadrant surgical clips. Likely postsurgical drainage catheter in the pelvis. No abnormal calcifications. BONES: No acute fracture. IMPRESSION: 1. Multiple dilated gas-filled small bowel segments in the mid to upper abdomen with no significant gas in the lower abdomen or colon, worriesome for a small bowel obstruction. Close radiographic follow up recommended. Electronically signed by: Rogelia Myers MD 03/26/2024 10:41 AM EST RP Workstation: [id number]  Anti-infectives: Anti-infectives (From admission, onward)    Start     Dose/Rate Route Frequency Ordered Stop   03/27/24 1400  piperacillin -tazobactam (ZOSYN ) IVPB 3.375 g        3.375 g 12.5 mL/hr over 240 Minutes Intravenous Every 8 hours 03/27/24 0955     03/24/24 2200  piperacillin -tazobactam (ZOSYN ) IVPB 3.375 g  Status:  Discontinued        3.375 g 12.5 mL/hr over 240 Minutes Intravenous Every 8 hours 03/24/24 1833 03/27/24 0955   03/24/24 0830  piperacillin -tazobactam (ZOSYN ) IVPB 3.375 g        3.375 g 12.5 mL/hr over 240 Minutes  Intravenous On call to O.R. 03/24/24 0823 03/24/24 0855   03/17/24 0200  piperacillin -tazobactam (ZOSYN ) IVPB 3.375 g        3.375 g 12.5 mL/hr over 240 Minutes Intravenous Every 8 hours 03/16/24 1928 03/24/24 0029   03/16/24 1730  piperacillin -tazobactam (ZOSYN ) IVPB 3.375 g        3.375 g 100 mL/hr over 30 Minutes Intravenous  Once 03/16/24 1719 03/16/24 1809        Assessment/Plan POD 3, s/p dx lap with incidental salpingectomy and drainage of intra-abdominal abscess, Dr. Dasie 1/20 for acute perforated appendicitis - path with fallopian tube and salpingitis with edema - WBC 25K, stable from 23, 24.  If remains elevated, will need a repeat CT scan likely over the weekend - Continue IV abx - cont NGT and await resolution of ileus -mobilize, pulm toilet -has been here 9 days with limited oral intake, if ileus persists, may need to consider TNA soon   FEN - NPO/NGT/IVFs VTE - Lovenox , SCDs ID - Zosyn   DM - SSI added, monitor CBGs.  Ozempic  on hold Dysuria - resolved, UA negative   LOS: 9 days    Burnard FORBES Banter, [facility] 03/27/2024, 10:00 AM Please see Amion for pager number during day hours 7:00am-4:30pm  "

## 2024-03-28 LAB — BASIC METABOLIC PANEL WITH GFR
Anion gap: 8 (ref 5–15)
BUN: <5 mg/dL — ABNORMAL LOW (ref 8–23)
CO2: 30 mmol/L (ref 22–32)
Calcium: 8.8 mg/dL — ABNORMAL LOW (ref 8.9–10.3)
Chloride: 100 mmol/L (ref 98–111)
Creatinine, Ser: 0.73 mg/dL (ref 0.44–1.00)
GFR, Estimated: >60 mL/min
Glucose, Bld: 120 mg/dL — ABNORMAL HIGH (ref 70–99)
Potassium: 3.9 mmol/L (ref 3.5–5.1)
Sodium: 138 mmol/L (ref 135–145)

## 2024-03-28 LAB — CBC
HCT: 35.7 % — ABNORMAL LOW (ref 36.0–46.0)
Hemoglobin: 11.5 g/dL — ABNORMAL LOW (ref 12.0–15.0)
MCH: 29.2 pg (ref 26.0–34.0)
MCHC: 32.2 g/dL (ref 30.0–36.0)
MCV: 90.6 fL (ref 80.0–100.0)
Platelets: 624 10*3/uL — ABNORMAL HIGH (ref 150–400)
RBC: 3.94 MIL/uL (ref 3.87–5.11)
RDW: 13.4 % (ref 11.5–15.5)
WBC: 21.2 10*3/uL — ABNORMAL HIGH (ref 4.0–10.5)
nRBC: 0 % (ref 0.0–0.2)

## 2024-03-28 LAB — GLUCOSE, CAPILLARY
Glucose-Capillary: 107 mg/dL — ABNORMAL HIGH (ref 70–99)
Glucose-Capillary: 117 mg/dL — ABNORMAL HIGH (ref 70–99)
Glucose-Capillary: 122 mg/dL — ABNORMAL HIGH (ref 70–99)
Glucose-Capillary: 133 mg/dL — ABNORMAL HIGH (ref 70–99)
Glucose-Capillary: 136 mg/dL — ABNORMAL HIGH (ref 70–99)
Glucose-Capillary: 141 mg/dL — ABNORMAL HIGH (ref 70–99)

## 2024-03-28 MED ORDER — PREGABALIN 100 MG PO CAPS
100.0000 mg | ORAL_CAPSULE | Freq: Two times a day (BID) | ORAL | Status: DC
  Administered 2024-03-28 – 2024-04-07 (×20): 100 mg via ORAL
  Filled 2024-03-28 (×21): qty 1

## 2024-03-28 MED ORDER — PANTOPRAZOLE SODIUM 40 MG PO TBEC
40.0000 mg | DELAYED_RELEASE_TABLET | Freq: Every day | ORAL | Status: DC
  Administered 2024-03-28 – 2024-03-31 (×4): 40 mg via ORAL
  Filled 2024-03-28 (×4): qty 1

## 2024-03-28 MED ORDER — DULOXETINE HCL 30 MG PO CPEP
30.0000 mg | ORAL_CAPSULE | Freq: Two times a day (BID) | ORAL | Status: DC
  Administered 2024-03-28 – 2024-04-07 (×20): 30 mg via ORAL
  Filled 2024-03-28 (×20): qty 1

## 2024-03-28 NOTE — Progress Notes (Signed)
 Notified Dr. Belinda of pt requesting restart of home meds Cymbalta  30mg  capsule BID, pregabalin  100mg  capsule BID and omeprazole  20 mg capsule BID. MD gave verbal to restart home meds. Sherline Jubilee

## 2024-03-28 NOTE — Progress Notes (Signed)
 4 Days Post-Op   Subjective/Chief Complaint: Patient has had several bowel movements and is passing flatus Intermittent cramping Drain 75 cc serosanguinous output   Objective: Vital signs in last 24 hours: Temp:  [98.4 F (36.9 C)-99.1 F (37.3 C)] 98.7 F (37.1 C) (01/24 0815) Pulse Rate:  [83-94] 92 (01/24 0815) Resp:  [14-19] 19 (01/24 0815) BP: (136-152)/(68-75) 136/73 (01/24 0815) SpO2:  [96 %-98 %] 96 % (01/24 0815) Last BM Date : 03/27/24  Intake/Output from previous day: 01/23 0701 - 01/24 0700 In: 919.9 [I.V.:769.9; IV Piggyback:150] Out: 1705 [Emesis/NG output:1630; Drains:75] Intake/Output this shift: Total I/O In: -  Out: 200 [Urine:200]  Abd - soft, non-distended NG minimal output JP minimal serosanguinous output  Lab Results:  Recent Labs    03/27/24 0446 03/28/24 0528  WBC 25.3* 21.2*  HGB 12.0 11.5*  HCT 36.5 35.7*  PLT 592* 624*   BMET Recent Labs    03/27/24 0446 03/28/24 0528  NA 135 138  K 4.0 3.9  CL 97* 100  CO2 29 30  GLUCOSE 130* 120*  BUN 9 <5*  CREATININE 0.91 0.73  CALCIUM 8.8* 8.8*   PT/INR No results for input(s): LABPROT, INR in the last 72 hours. ABG No results for input(s): PHART, HCO3 in the last 72 hours.  Invalid input(s): PCO2, PO2  Studies/Results: DG Abd Portable 1V Result Date: 03/26/2024 CLINICAL DATA:  NG placement. EXAM: PORTABLE ABDOMEN - 1 VIEW COMPARISON:  Abdominal radiograph dated 03/26/2024. FINDINGS: Enteric tube with tip in the distal stomach. Persistent dilatation of the small bowel loops measure up to 4.5 cm in caliber. Right upper quadrant cholecystectomy clips. No acute osseous pathology. IMPRESSION: Enteric tube with tip in the distal stomach. Persistent small bowel dilatation. Electronically Signed   By: Vanetta Chou M.D.   On: 03/26/2024 14:25    Anti-infectives: Anti-infectives (From admission, onward)    Start     Dose/Rate Route Frequency Ordered Stop   03/27/24 1400   piperacillin -tazobactam (ZOSYN ) IVPB 3.375 g        3.375 g 12.5 mL/hr over 240 Minutes Intravenous Every 8 hours 03/27/24 0955     03/24/24 2200  piperacillin -tazobactam (ZOSYN ) IVPB 3.375 g  Status:  Discontinued        3.375 g 12.5 mL/hr over 240 Minutes Intravenous Every 8 hours 03/24/24 1833 03/27/24 0955   03/24/24 0830  piperacillin -tazobactam (ZOSYN ) IVPB 3.375 g        3.375 g 12.5 mL/hr over 240 Minutes Intravenous On call to O.R. 03/24/24 0823 03/24/24 0855   03/17/24 0200  piperacillin -tazobactam (ZOSYN ) IVPB 3.375 g        3.375 g 12.5 mL/hr over 240 Minutes Intravenous Every 8 hours 03/16/24 1928 03/24/24 0029   03/16/24 1730  piperacillin -tazobactam (ZOSYN ) IVPB 3.375 g        3.375 g 100 mL/hr over 30 Minutes Intravenous  Once 03/16/24 1719 03/16/24 1809       Assessment/Plan: POD 4, s/p dx lap with incidental salpingectomy and drainage of intra-abdominal abscess, Dr. Dasie 1/20 for acute perforated appendicitis - path with fallopian tube and salpingitis with edema - WBC 21, down from 25.  If WBC trends up, will need a repeat CT scan likely over the weekend - Continue IV abx - clamp NG tube/ start clear liquids -mobilize, pulm toilet -has been here 9 days with limited oral intake, if ileus persists, may need to consider TNA soon   FEN - clear liquids/ clamp NGT/IVFs VTE - Lovenox , SCDs ID -  Zosyn    DM - SSI added, monitor CBGs.  Ozempic  on hold Dysuria - resolved, UA negative  LOS: 10 days    Andrea Pittman 03/28/2024

## 2024-03-29 LAB — GLUCOSE, CAPILLARY
Glucose-Capillary: 139 mg/dL — ABNORMAL HIGH (ref 70–99)
Glucose-Capillary: 152 mg/dL — ABNORMAL HIGH (ref 70–99)
Glucose-Capillary: 99 mg/dL (ref 70–99)

## 2024-03-29 MED ORDER — BOOST / RESOURCE BREEZE PO LIQD CUSTOM
1.0000 | Freq: Three times a day (TID) | ORAL | Status: DC
  Administered 2024-03-29 – 2024-03-30 (×4): 1 via ORAL
  Filled 2024-03-29: qty 1

## 2024-03-29 MED ORDER — ALUM & MAG HYDROXIDE-SIMETH 200-200-20 MG/5ML PO SUSP
15.0000 mL | Freq: Four times a day (QID) | ORAL | Status: DC | PRN
  Administered 2024-03-29 – 2024-03-30 (×2): 15 mL via ORAL
  Filled 2024-03-29 (×2): qty 30

## 2024-03-29 NOTE — Progress Notes (Signed)
 5 Days Post-Op   Subjective/Chief Complaint: Several small bowel movements No nausea or vomiting with NG clamped No new labs today   Objective: Vital signs in last 24 hours: Temp:  [97.5 F (36.4 C)-98.4 F (36.9 C)] 98.1 F (36.7 C) (01/25 0910) Pulse Rate:  [82-93] 82 (01/25 0910) Resp:  [16-18] 16 (01/25 0910) BP: (139-157)/(78-85) 152/82 (01/25 0910) SpO2:  [95 %-100 %] 100 % (01/25 0910) Last BM Date : 03/27/24  Intake/Output from previous day: 01/24 0701 - 01/25 0700 In: -  Out: 333 [Urine:200; Emesis/NG output:100; Drains:33] Intake/Output this shift: No intake/output data recorded.  Abd - soft, non-distended NG remains clamped JP minimal serosanguinous output  Lab Results:  Recent Labs    03/27/24 0446 03/28/24 0528  WBC 25.3* 21.2*  HGB 12.0 11.5*  HCT 36.5 35.7*  PLT 592* 624*   BMET Recent Labs    03/27/24 0446 03/28/24 0528  NA 135 138  K 4.0 3.9  CL 97* 100  CO2 29 30  GLUCOSE 130* 120*  BUN 9 <5*  CREATININE 0.91 0.73  CALCIUM 8.8* 8.8*    Anti-infectives: Anti-infectives (From admission, onward)    Start     Dose/Rate Route Frequency Ordered Stop   03/27/24 1400  piperacillin -tazobactam (ZOSYN ) IVPB 3.375 g        3.375 g 12.5 mL/hr over 240 Minutes Intravenous Every 8 hours 03/27/24 0955     03/24/24 2200  piperacillin -tazobactam (ZOSYN ) IVPB 3.375 g  Status:  Discontinued        3.375 g 12.5 mL/hr over 240 Minutes Intravenous Every 8 hours 03/24/24 1833 03/27/24 0955   03/24/24 0830  piperacillin -tazobactam (ZOSYN ) IVPB 3.375 g        3.375 g 12.5 mL/hr over 240 Minutes Intravenous On call to O.R. 03/24/24 0823 03/24/24 0855   03/17/24 0200  piperacillin -tazobactam (ZOSYN ) IVPB 3.375 g        3.375 g 12.5 mL/hr over 240 Minutes Intravenous Every 8 hours 03/16/24 1928 03/24/24 0029   03/16/24 1730  piperacillin -tazobactam (ZOSYN ) IVPB 3.375 g        3.375 g 100 mL/hr over 30 Minutes Intravenous  Once 03/16/24 1719 03/16/24 1809        Assessment/Plan: POD 5, s/p dx lap with incidental salpingectomy and drainage of intra-abdominal abscess, Dr. Dasie 1/20 for acute perforated appendicitis - path with fallopian tube and salpingitis with edema - WBC 21, down from 25.  If WBC trends up, will need a repeat CT scan  - Continue IV abx - d/c NG tube; full liquids -mobilize, pulm toilet - repeat labs in AM   FEN - full liquids/ D/C NGT/IVFs VTE - Lovenox , SCDs ID - Zosyn   LOS: 11 days    Donnice MARLA Lima 03/29/2024

## 2024-03-29 NOTE — Progress Notes (Signed)
 Incentive spirometer education provided. Pt performed return demonstration. Verbalized understanding . Andrea Pittman

## 2024-03-29 NOTE — Plan of Care (Signed)
 Patient calm and cooperative, A&O X4, family at bedside. Patient tolerated medications well. JP drain site cleaned with CHG, purulent drainage at site, dressing changed. Patient left with call bell in reach and bed in lowest position.   Problem: Education: Goal: Knowledge of General Education information will improve Description: Including pain rating scale, medication(s)/side effects and non-pharmacologic comfort measures Outcome: Progressing   Problem: Health Behavior/Discharge Planning: Goal: Ability to manage health-related needs will improve Outcome: Progressing   Problem: Clinical Measurements: Goal: Ability to maintain clinical measurements within normal limits will improve Outcome: Progressing   Problem: Activity: Goal: Risk for activity intolerance will decrease Outcome: Progressing   Problem: Nutrition: Goal: Adequate nutrition will be maintained Outcome: Progressing   Problem: Coping: Goal: Level of anxiety will decrease Outcome: Progressing   Problem: Pain Managment: Goal: General experience of comfort will improve and/or be controlled Outcome: Progressing   Problem: Education: Goal: Ability to describe self-care measures that may prevent or decrease complications (Diabetes Survival Skills Education) will improve Outcome: Progressing   Problem: Coping: Goal: Ability to adjust to condition or change in health will improve Outcome: Progressing   Problem: Nutritional: Goal: Maintenance of adequate nutrition will improve Outcome: Progressing

## 2024-03-30 ENCOUNTER — Inpatient Hospital Stay (HOSPITAL_COMMUNITY)

## 2024-03-30 ENCOUNTER — Encounter (HOSPITAL_COMMUNITY): Payer: Self-pay

## 2024-03-30 ENCOUNTER — Other Ambulatory Visit: Payer: Self-pay

## 2024-03-30 LAB — BASIC METABOLIC PANEL WITH GFR
Anion gap: 12 (ref 5–15)
BUN: <5 mg/dL — ABNORMAL LOW (ref 8–23)
CO2: 26 mmol/L (ref 22–32)
Calcium: 9.4 mg/dL (ref 8.9–10.3)
Chloride: 101 mmol/L (ref 98–111)
Creatinine, Ser: 0.84 mg/dL (ref 0.44–1.00)
GFR, Estimated: >60 mL/min
Glucose, Bld: 136 mg/dL — ABNORMAL HIGH (ref 70–99)
Potassium: 4.2 mmol/L (ref 3.5–5.1)
Sodium: 139 mmol/L (ref 135–145)

## 2024-03-30 LAB — GLUCOSE, CAPILLARY
Glucose-Capillary: 120 mg/dL — ABNORMAL HIGH (ref 70–99)
Glucose-Capillary: 114 mg/dL — ABNORMAL HIGH (ref 70–99)
Glucose-Capillary: 113 mg/dL — ABNORMAL HIGH (ref 70–99)
Glucose-Capillary: 108 mg/dL — ABNORMAL HIGH (ref 70–99)

## 2024-03-30 LAB — CBC
HCT: 37.6 % (ref 36.0–46.0)
Hemoglobin: 12.2 g/dL (ref 12.0–15.0)
MCH: 30 pg (ref 26.0–34.0)
MCHC: 32.4 g/dL (ref 30.0–36.0)
MCV: 92.4 fL (ref 80.0–100.0)
Platelets: 719 10*3/uL — ABNORMAL HIGH (ref 150–400)
RBC: 4.07 MIL/uL (ref 3.87–5.11)
RDW: 13.3 % (ref 11.5–15.5)
WBC: 20.1 10*3/uL — ABNORMAL HIGH (ref 4.0–10.5)
nRBC: 0 % (ref 0.0–0.2)

## 2024-03-30 MED ORDER — IOHEXOL 9 MG/ML PO SOLN
500.0000 mL | ORAL | Status: AC
  Administered 2024-03-30 (×2): 500 mL via ORAL

## 2024-03-30 MED ORDER — IOHEXOL 350 MG/ML SOLN
65.0000 mL | Freq: Once | INTRAVENOUS | Status: AC | PRN
  Administered 2024-03-30: 65 mL via INTRAVENOUS

## 2024-03-30 NOTE — Plan of Care (Signed)
 Patient calm and cooperative A&O X4, husband at bedside. Patient stronger today than yesterday. PIV maintained, patient educated on nutrition and mobility. Small bowel movement this shift. Patient left with call bell in reach and bed in lowest position.  Problem: Education: Goal: Knowledge of General Education information will improve Description: Including pain rating scale, medication(s)/side effects and non-pharmacologic comfort measures Outcome: Progressing   Problem: Health Behavior/Discharge Planning: Goal: Ability to manage health-related needs will improve Outcome: Progressing   Problem: Clinical Measurements: Goal: Ability to maintain clinical measurements within normal limits will improve Outcome: Progressing   Problem: Activity: Goal: Risk for activity intolerance will decrease Outcome: Progressing   Problem: Nutrition: Goal: Adequate nutrition will be maintained Outcome: Progressing   Problem: Coping: Goal: Level of anxiety will decrease Outcome: Progressing   Problem: Elimination: Goal: Will not experience complications related to bowel motility Outcome: Progressing   Problem: Pain Managment: Goal: General experience of comfort will improve and/or be controlled Outcome: Progressing   Problem: Education: Goal: Ability to describe self-care measures that may prevent or decrease complications (Diabetes Survival Skills Education) will improve Outcome: Progressing   Problem: Nutritional: Goal: Maintenance of adequate nutrition will improve Outcome: Progressing   Problem: Skin Integrity: Goal: Risk for impaired skin integrity will decrease Outcome: Progressing

## 2024-03-30 NOTE — Progress Notes (Signed)
 6 Days Post-Op   Subjective/Chief Complaint: No flatus or bm, not up much, a lot of reflux   Objective: Vital signs in last 24 hours: Temp:  [97.8 F (36.6 C)-98.4 F (36.9 C)] 97.8 F (36.6 C) (01/26 0903) Pulse Rate:  [82-92] 90 (01/26 0903) Resp:  [16-17] 16 (01/26 0903) BP: (135-168)/(72-90) 152/90 (01/26 0903) SpO2:  [96 %-100 %] 96 % (01/26 0903) Last BM Date : 03/28/24  Intake/Output from previous day: 01/25 0701 - 01/26 0700 In: 118 [P.O.:118] Out: 21 [Drains:20; Stool:1] Intake/Output this shift: No intake/output data recorded.  Ab soft approp tender jp site with some irritation, jp with serosang fluid  Lab Results:  Recent Labs    03/28/24 0528 03/30/24 0215  WBC 21.2* 20.1*  HGB 11.5* 12.2  HCT 35.7* 37.6  PLT 624* 719*   BMET Recent Labs    03/28/24 0528 03/30/24 0215  NA 138 139  K 3.9 4.2  CL 100 101  CO2 30 26  GLUCOSE 120* 136*  BUN <5* <5*  CREATININE 0.73 0.84  CALCIUM 8.8* 9.4   PT/INR No results for input(s): LABPROT, INR in the last 72 hours. ABG No results for input(s): PHART, HCO3 in the last 72 hours.  Invalid input(s): PCO2, PO2  Studies/Results: No results found.  Anti-infectives: Anti-infectives (From admission, onward)    Start     Dose/Rate Route Frequency Ordered Stop   03/27/24 1400  piperacillin -tazobactam (ZOSYN ) IVPB 3.375 g        3.375 g 12.5 mL/hr over 240 Minutes Intravenous Every 8 hours 03/27/24 0955     03/24/24 2200  piperacillin -tazobactam (ZOSYN ) IVPB 3.375 g  Status:  Discontinued        3.375 g 12.5 mL/hr over 240 Minutes Intravenous Every 8 hours 03/24/24 1833 03/27/24 0955   03/24/24 0830  piperacillin -tazobactam (ZOSYN ) IVPB 3.375 g        3.375 g 12.5 mL/hr over 240 Minutes Intravenous On call to O.R. 03/24/24 0823 03/24/24 0855   03/17/24 0200  piperacillin -tazobactam (ZOSYN ) IVPB 3.375 g        3.375 g 12.5 mL/hr over 240 Minutes Intravenous Every 8 hours 03/16/24 1928 03/24/24  0029   03/16/24 1730  piperacillin -tazobactam (ZOSYN ) IVPB 3.375 g        3.375 g 100 mL/hr over 30 Minutes Intravenous  Once 03/16/24 1719 03/16/24 1809       Assessment/Plan: POD 6 s/p dx lap with incidental salpingectomy and drainage of intra-abdominal abscess, Dr. Dasie 1/20 for acute perforated appendicitis - path with fallopian tube and salpingitis with edema - WBC20 at pod 6, no bowel function, will check ct today -if not tol diet by tomorrow pending ct will need picc/tpn - Continue IV abx - clears -mobilize, pulm toilet - repeat labs in AM   FEN - clears/IVFs VTE - Lovenox , SCDs ID - [email or web address]   Donnice Bury 03/30/2024

## 2024-03-30 NOTE — Progress Notes (Signed)
 Noted TPN consult for TPN secondary to prolonged ileus. TPN labs ordered for 1/27. TPN will be placed on 1/27 for day 1 of therapy. TPN will not be started tonight secondary to timing of consult.  Rankin Sams, PharmD, BCCCP Clinical Pharmacist

## 2024-03-30 NOTE — Consult Note (Signed)
 "     Chief Complaint: Patient was seen in consultation today for intra-abdominal fluid collection  Referring Physician(s): Dr. Donnice Bury  Supervising Physician: Philip Cornet  Patient Status: [facility] - In-pt  History of Present Illness: Andrea Pittman is a 69 y.o. female POD 6 from diagnostic lap with ultimate salpingectomy and drainage of intra-abdominal abscess with perforated appendicitis by Dr. Dasie. Now with ongoing abdominal pain, no return of bowel function and leukocytosis.  CT Abdomen Pelvis obtained and shows at least 3 abscesses in the ileocolic mesentery and perirectal region. Percutaneous drain in the right anatomic pelvis as well as SBO.  IR consulted for aspiration and drainage. Case reviewed by Dr. Philip who approves patient for the procedure.    APP to bedside.  Patient fatigued, but alert and conversant.  Several questions which are answered. She is agreeable.   Past Medical History:  Diagnosis Date   Anxiety    Depression    Diabetes mellitus without complication (HCC)    GERD (gastroesophageal reflux disease)     Past Surgical History:  Procedure Laterality Date   ABDOMINAL HYSTERECTOMY     BILATERAL CARPAL TUNNEL RELEASE     bladder tack     CHOLECYSTECTOMY N/A 09/23/2020   Procedure: LAPAROSCOPIC CHOLECYSTECTOMY;  Surgeon: [name], MD;  Location: [facility] OR;  Service: General;  Laterality: N/A;   ERCP N/A 09/22/2020   Procedure: ENDOSCOPIC RETROGRADE CHOLANGIOPANCREATOGRAPHY (ERCP);  Surgeon: [name], MD;  [facility];  Service: Endoscopy;  Laterality: N/A;   LAPAROSCOPY N/A 03/24/2024   Procedure: LAPAROSCOPY, DIAGNOSTIC with appendectomy, lysis of adhesions;  Surgeon: [name], MD;  Location: [facility] OR;  Service: General;  Laterality: N/A;   REMOVAL OF STONES  09/22/2020   Procedure: REMOVAL OF STONES;  Surgeon: [name], MD;  Location: [facility] ENDOSCOPY;  Service: Endoscopy;;   SPHINCTEROTOMY  09/22/2020   Procedure: ANNETT;   Surgeon: [name], MD;  Location: [facility] ENDOSCOPY;  Service: Endoscopy;;    Allergies: Patient has no known allergies.  Medications: Prior to Admission medications  Medication Sig Start Date End Date Taking? Authorizing Provider  acetaminophen  (TYLENOL ) 500 MG tablet Take 2 tablets (1,000 mg total) by mouth every 6 (six) hours as needed. Patient taking differently: Take 500 mg by mouth every 6 (six) hours as needed for mild pain (pain score 1-3). 09/23/20  Yes Tammy Sor, PA-C  Cholecalciferol (VITAMIN D) 50 MCG (2000 UT) CAPS Take by mouth.   Yes [provider]  Cyanocobalamin (B-12) 100 MCG TABS Take 1 tablet by mouth daily.   Yes [provider]  DULoxetine  (CYMBALTA ) 60 MG capsule Take 2 capsules (120 mg total) by mouth daily. Patient taking differently: Take 60 mg by mouth daily. 10/17/23  Yes Dettinger, Fonda LABOR, MD  estradiol  (ESTRACE ) 0.5 MG tablet Take 0.5 mg by mouth daily. 01/16/24  Yes [provider]  ibuprofen (ADVIL) 200 MG tablet Take 400-600 mg by mouth every 6 (six) hours as needed for fever, headache or mild pain.   Yes [provider]  omega-3 acid ethyl esters (LOVAZA) 1 g capsule Take 1 g by mouth daily.   Yes [provider]  omeprazole  (PRILOSEC) 20 MG capsule Take 1 capsule (20 mg total) by mouth daily. 10/17/23  Yes Dettinger, Fonda LABOR, MD  pregabalin  (LYRICA ) 100 MG capsule Take 1 capsule (100 mg total) by mouth 2 (two) times daily. 09/10/23  Yes Patel, Donika K, DO  Semaglutide ,0.25 or 0.5MG /DOS, 2 MG/3ML SOPN Inject 0.25  mg into the skin once a week. 10/17/23  Yes Dettinger, Fonda LABOR, MD  estradiol  (ESTRACE ) 2 MG tablet Take 1 mg by mouth daily. Patient not taking: Reported on 03/16/2024 08/03/20   [provider]  ondansetron  (ZOFRAN -ODT) 8 MG disintegrating tablet 8mg  ODT q4 hours prn nausea Patient not taking: Reported on 03/16/2024 03/13/23   [name], MD     Family History  Problem Relation Age of Onset    Cancer Mother     Social History   Socioeconomic History   Marital status: Married    Spouse name: Not on file   Number of children: Not on file   Years of education: Not on file   Highest education level: 12th grade  Occupational History   Not on file  Tobacco Use   Smoking status: Never   Smokeless tobacco: Never  Vaping Use   Vaping status: Never Used  Substance and Sexual Activity   Alcohol use: No   Drug use: No   Sexual activity: Not on file  Other Topics Concern   Not on file  Social History Narrative   She does not work and watches her grandson.    Right handed    Lives in one story home with a basement.   Social Drivers of Health   Tobacco Use: Low Risk (03/30/2024)   Patient History    Smoking Tobacco Use: Never    Smokeless Tobacco Use: Never    Passive Exposure: Not on file  Financial Resource Strain: Low Risk (01/23/2024)   Overall Financial Resource Strain (CARDIA)    Difficulty of Paying Living Expenses: Not hard at all  Food Insecurity: No Food Insecurity (03/17/2024)   Epic    Worried About Programme Researcher, Broadcasting/film/video in the Last Year: Never true    Ran Out of Food in the Last Year: Never true  Transportation Needs: No Transportation Needs (03/17/2024)   Epic    Lack of Transportation (Medical): No    Lack of Transportation (Non-Medical): No  Physical Activity: Unknown (01/23/2024)   Exercise Vital Sign    Days of Exercise per Week: 2 days    Minutes of Exercise per Session: Patient declined  Stress: No Stress Concern Present (01/23/2024)   [employer] of Occupational Health - Occupational Stress Questionnaire    Feeling of Stress: Only a little  Social Connections: Socially Integrated (03/17/2024)   Social Connection and Isolation Panel    Frequency of Communication with Friends and Family: More than three times a week    Frequency of Social Gatherings with Friends and Family: Once a week    Attends Religious Services: More than 4 times per  year    Active Member of Clubs or Organizations: Yes    Attends Banker Meetings: More than 4 times per year    Marital Status: Married  Depression (PHQ2-9): Medium Risk (01/24/2024)   Depression (PHQ2-9)    PHQ-2 Score: 6  Alcohol Screen: Low Risk (09/16/2023)   Alcohol Screen    Last Alcohol Screening Score (AUDIT): 0  Housing: Low Risk (03/17/2024)   Epic    Unable to Pay for Housing in the Last Year: No    Number of Times Moved in the Last Year: 0    Homeless in the Last Year: No  Utilities: Not At Risk (03/17/2024)   Epic    Threatened with loss of utilities: No  Health Literacy: Adequate Health Literacy (09/16/2023)   B1300 Health Literacy    Frequency  of need for help with medical instructions: Never     Review of Systems: A 12 point ROS discussed and pertinent positives are indicated in the HPI above.  All other systems are negative.  Review of Systems  Constitutional:  Positive for fatigue. Negative for fever.  Respiratory:  Negative for cough and shortness of breath.   Cardiovascular:  Negative for chest pain.  Gastrointestinal:  Positive for abdominal distention, abdominal pain and nausea.  Musculoskeletal:  Negative for back pain.  Psychiatric/Behavioral:  Negative for behavioral problems and confusion.     Vital Signs: BP (!) 152/90   Pulse 90   Temp 97.8 F (36.6 C) (Oral)   Resp 16   Ht 5' 4 (1.626 m)   Wt 188 lb (85.3 kg)   SpO2 96%   BMI 32.27 kg/m   Physical Exam Vitals and nursing note reviewed.  Constitutional:      General: She is not in acute distress.    Appearance: She is well-developed. She is not ill-appearing.  Cardiovascular:     Rate and Rhythm: Normal rate and regular rhythm.  Pulmonary:     Effort: Pulmonary effort is normal. No respiratory distress.  Skin:    General: Skin is warm and dry.  Neurological:     General: No focal deficit present.     Mental Status: She is alert and oriented to person, place, and time.       MD Evaluation Airway: WNL Heart: WNL Abdomen: WNL Chest/ Lungs: WNL ASA  Classification: 3 Mallampati/Airway Score: Two   Imaging: US  EKG SITE RITE Result Date: 03/30/2024 If Site Rite image not attached, placement could not be confirmed due to current cardiac rhythm.  CT ABDOMEN PELVIS W CONTRAST Result Date: 03/30/2024 CLINICAL DATA:  Postop abdominal pain. EXAM: CT ABDOMEN AND PELVIS WITH CONTRAST TECHNIQUE: Multidetector CT imaging of the abdomen and pelvis was performed using the standard protocol following bolus administration of intravenous contrast. RADIATION DOSE REDUCTION: This exam was performed according to the departmental dose-optimization program which includes automated exposure control, adjustment of the mA and/or kV according to patient size and/or use of iterative reconstruction technique. CONTRAST:  65mL OMNIPAQUE  IOHEXOL  350 MG/ML SOLN COMPARISON:  03/19/2024. FINDINGS: Lower chest: Minimal dependent atelectasis bilaterally. Heart is at the upper limits of normal in size to mildly enlarged. No pericardial or pleural effusion. Distal esophagus is grossly unremarkable. Hepatobiliary: Liver is slightly decreased in attenuation diffusely. Pneumobilia, as before. Cholecystectomy. No unexpected biliary ductal dilatation. Pancreas: Negative. Spleen: Negative. Adrenals/Urinary Tract: Adrenal glands are unremarkable. Subcentimeter low-attenuation lesions in the kidneys. No specific follow-up necessary. Ureters are decompressed. Bladder is unremarkable. Stomach/Bowel: Stomach is distended with fluid. Marked dilatation of the duodenum and proximal jejunum with a transition point to normal caliber small bowel in the right mid abdomen (3/48). Appendectomy 03/24/2024 with an organized fluid collection in the ileocolic mesentery, measuring 1.7 x 5.0 cm (3/61). Percutaneous drain terminates slightly lateral to this fluid collection. A second fluid collection is seen somewhat  superomedially, measuring 2.4 x 2.5 cm (3/54). Appendectomy. Colon is otherwise unremarkable. Vascular/Lymphatic: Atherosclerotic calcification of the aorta. No pathologically enlarged lymph nodes. Reproductive: No adnexal mass. Other: Small bilateral inguinal hernias contain fat. Organized collection of perirectal fluid and air measures 3.3 x 4.9 cm (3/68). Adjacent peritoneal soft tissue thickening. Musculoskeletal: Degenerative changes in the spine. Bone island in the left pubic bone. IMPRESSION: 1. Interval appendectomy with at least 3 abscesses in the ileocolic mesentery and perirectal region. Percutaneous drain in the  right anatomic pelvis. 2. Duodenum and proximal jejunum are markedly dilated and fluid-filled with a transition point in the right mid abdomen, indicative of a small bowel obstruction. 3. Hepatic steatosis. 4.  Aortic atherosclerosis (ICD10-I70.0). Electronically Signed   By: Newell Eke M.D.   On: 03/30/2024 14:22   DG Abd Portable 1V Result Date: 03/26/2024 CLINICAL DATA:  NG placement. EXAM: PORTABLE ABDOMEN - 1 VIEW COMPARISON:  Abdominal radiograph dated 03/26/2024. FINDINGS: Enteric tube with tip in the distal stomach. Persistent dilatation of the small bowel loops measure up to 4.5 cm in caliber. Right upper quadrant cholecystectomy clips. No acute osseous pathology. IMPRESSION: Enteric tube with tip in the distal stomach. Persistent small bowel dilatation. Electronically Signed   By: Vanetta Chou M.D.   On: 03/26/2024 14:25   DG Abd Portable 1V Result Date: 03/26/2024 EXAM: 1 VIEW XRAY OF THE ABDOMEN 03/26/2024 10:10:00 AM COMPARISON: None available. CLINICAL HISTORY: Ileus. ICD10: [id number] Ileus (HCC). FINDINGS: BOWEL: Multiple segments of gas filled and dilated small bowel segments in the mid to upper abdomen. No significant gas in the lower abdomen or colon. SOFT TISSUES: Right upper quadrant surgical clips. Likely postsurgical drainage catheter in the pelvis. No abnormal  calcifications. BONES: No acute fracture. IMPRESSION: 1. Multiple dilated gas-filled small bowel segments in the mid to upper abdomen with no significant gas in the lower abdomen or colon, worriesome for a small bowel obstruction. Close radiographic follow up recommended. Electronically signed by: Rogelia Myers MD 03/26/2024 10:41 AM EST RP Workstation: [id number]   CT ABDOMEN PELVIS W CONTRAST Result Date: 03/19/2024 EXAM: CT ABDOMEN AND PELVIS WITH CONTRAST 03/19/2024 10:33:01 PM TECHNIQUE: CT of the abdomen and pelvis was performed with the administration of 75 mL of iohexol  (OMNIPAQUE ) 350 MG/ML injection. Multiplanar reformatted images are provided for review. Automated exposure control, iterative reconstruction, and/or weight-based adjustment of the mA/kV was utilized to reduce the radiation dose to as low as reasonably achievable. COMPARISON: 03/16/2024 CLINICAL HISTORY: RLQ abdominal pain; Perforated appendicitis, follow up CT. FINDINGS: LOWER CHEST: New small right pleural effusion. LIVER: Moderate hepatic steatosis. GALLBLADDER AND BILE DUCTS: Status post cholecystectomy. Mild dilation of the biliary tree likely reflects post cholecystectomy change. Pneumobilia related to prior sphincterotomy. SPLEEN: No acute abnormality. PANCREAS: No acute abnormality. ADRENAL GLANDS: No acute abnormality. KIDNEYS, URETERS AND BLADDER: No stones in the kidneys or ureters. No hydronephrosis. No perinephric or periureteral stranding. Urinary bladder is unremarkable. GI AND BOWEL: Stomach demonstrates no acute abnormality. There is progressive enlargement of the ill-defined region of fluid and gas adjacent to the cecum related to perforated appendicitis in keeping with a developing abscess now measuring 4.2 x 8.5 cm (series 63, image 2). Appendicolith again noted within the dilated and perforated residual appendix (series 67, image 2). There is progressive gas and fluid distention of the mid and distal small bowel to  the point of the terminal ileum where this narrows secondary to inflammatory change as it crosses adjacent to the developing abscess within the right lower quadrant (series 67, image 2), in keeping with a developing small bowel obstruction. Mild distal colonic diverticulosis. PERITONEUM AND RETROPERITONEUM: New trace ascites. No gross free intraperitoneal gas. VASCULATURE: Aorta is normal in caliber. LYMPH NODES: No lymphadenopathy. REPRODUCTIVE ORGANS: No acute abnormality. BONES AND SOFT TISSUES: No acute osseous abnormality. No focal soft tissue abnormality. IMPRESSION: 1. Progressive enlargement of the right lower quadrant poorly loculated fluid and gas collection adjacent to the cecum related to perforated appendicitis, now measuring 4.2 x 8.5 cm, compatible  with a developing abscess, with appendicolith again noted within the residual ruptured appendix. 2. Progressive gas and fluid distention of the mid and distal small bowel to the level of the terminal ileum, compatible with a developing small bowel obstruction due to adjacent inflammatory change. 3. New small right pleural effusion and new trace ascites, without gross free intraperitoneal gas. 4. Moderate hepatic steatosis, status post cholecystectomy with mild biliary tree dilation and pneumobilia related to prior sphincterotomy, and mild distal colonic diverticulosis. Electronically signed by: Dorethia Molt MD 03/19/2024 10:46 PM EST RP Workstation: [id number]   CT ABDOMEN PELVIS W CONTRAST Result Date: 03/16/2024 CLINICAL DATA:  Abdominal pain, acute, nonlocalized. EXAM: CT ABDOMEN AND PELVIS WITH CONTRAST TECHNIQUE: Multidetector CT imaging of the abdomen and pelvis was performed using the standard protocol following bolus administration of intravenous contrast. RADIATION DOSE REDUCTION: This exam was performed according to the departmental dose-optimization program which includes automated exposure control, adjustment of the mA and/or kV according to  patient size and/or use of iterative reconstruction technique. CONTRAST:  OMNIPAQUE  IOHEXOL  300 MG/ML  SOLN COMPARISON:  08/26/2020 FINDINGS: Lower chest: Lung bases are clear. Hepatobiliary: Normal appearance of the liver. Main portal venous system is patent. Cholecystectomy. Small amount of gas within the biliary system and likely related to previous ERCP. No significant biliary dilatation. Pancreas: Small fat foci in the head and uncinate process. No evidence for pancreatic duct dilatation or pancreatic inflammation. Spleen: Normal in size without focal abnormality. Adrenals/Urinary Tract: Normal adrenal glands. Normal appearance of both kidneys without hydronephrosis. Normal urinary bladder. 8 mm hypodensity in the left kidney upper pole on image 30/2. This is better seen on the coronal images and the Hounsfield units measure 26 on image 37/4. This likely represents a left renal cyst. No suspicious renal lesion. Stomach/Bowel: Normal stomach. Large amount of inflammation with small pockets of free air in the right lower quadrant. Diffuse wall thickening in the terminal ileum. Inflammatory process in right lower quadrant is most compatible with a perforated appendicitis. Base of the appendix is probably on image 69/2 and difficult to exclude an appendicolith in this area. No evidence for bowel dilatation or obstruction. Vascular/Lymphatic: Mild atherosclerotic disease in the abdominal aorta without aneurysm. Main visceral arteries are patent. No significant lymph node enlargement in the abdomen or pelvis. Reproductive: Status post hysterectomy. No adnexal masses. Other: Large amount of inflammation in the right lower quadrant centered around the perforated appendicitis. Small collection of fluid in the right lower quadrant on image 71/2 but not clear if this is within the cecum or extraluminal. There is not a well-defined or discrete abscess collection in the right lower quadrant at this time. Small  umbilical hernia containing fat. Musculoskeletal: Chronic sclerotic focus involving the left pubic bone. No acute bone abnormality. IMPRESSION: 1. Perforated appendicitis. Large amount of inflammation in the right lower quadrant centered around the perforated appendix. Multiple small pockets of extraluminal gas. No well-defined abscess collection at this time. 2. Diffuse wall thickening in the terminal ileum is likely secondary to the perforated appendicitis. 3. Aortic Atherosclerosis (ICD10-I70.0). These results were called by telephone at the time of interpretation on 03/16/2024 at 5:17 pm to provider ADAM CURATOLO , who verbally acknowledged these results. Electronically Signed   By: Juliene Balder M.D.   On: 03/16/2024 17:19    Labs:  CBC: Recent Labs    03/26/24 0430 03/27/24 0446 03/28/24 0528 03/30/24 0215  WBC 24.8* 25.3* 21.2* 20.1*  HGB 12.1 12.0 11.5* 12.2  HCT 36.2  36.5 35.7* 37.6  PLT 520* 592* 624* 719*    COAGS: Recent Labs    03/20/24 0508  INR 1.0    BMP: Recent Labs    03/26/24 0430 03/27/24 0446 03/28/24 0528 03/30/24 0215  NA 133* 135 138 139  K 3.4* 4.0 3.9 4.2  CL 94* 97* 100 101  CO2 26 29 30 26   GLUCOSE 121* 130* 120* 136*  BUN 14 9 <5* <5*  CALCIUM 8.8* 8.8* 8.8* 9.4  CREATININE 1.06* 0.91 0.73 0.84  GFRNONAA 57* >60 >60 >60    LIVER FUNCTION TESTS: Recent Labs    07/15/23 1432 10/17/23 1042 01/24/24 0949 03/16/24 1513  BILITOT 0.4 0.5 0.6 1.3*  AST 56* 45* 22 18  ALT 44* 43* 26 14  ALKPHOS 105 96 87 106  PROT 7.4 7.1 7.3 7.8  ALBUMIN 3.8* 3.8* 3.9 3.6    TUMOR MARKERS: No results for input(s): AFPTM, CEA, CA199, CHROMGRNA in the last 8760 hours.  Assessment and Plan: Intra-abdominal fluid collection Patient with post-op fluid collection.  IR consulted for aspiration and drainage.  Case reviewed and approved by Dr. Philip.  APP to bedside.  All questions answered and patient and her husband are areeable to proceed.  NPO p MN.   INR 1.0. Not on blood thinners.   Risks and benefits discussed with the patient including bleeding, infection, damage to adjacent structures, bowel perforation/fistula connection, and sepsis.  All of the patient's questions were answered, patient is agreeable to proceed. Consent signed and in chart.   Thank you for this interesting consult.  I greatly enjoyed meeting SEREENA MARANDO and look forward to participating in their care.  A copy of this report was sent to the requesting provider on this date.  Electronically Signed: Dekota Shenk Sue-Ellen Darrio Bade, PA 03/30/2024, 4:18 PM   I spent a total of 40 Minutes    in face to face in clinical consultation, greater than 50% of which was counseling/coordinating care for intra-abdominal fluid collection.   "

## 2024-03-31 ENCOUNTER — Inpatient Hospital Stay (HOSPITAL_COMMUNITY)

## 2024-03-31 LAB — COMPREHENSIVE METABOLIC PANEL WITH GFR
ALT: 19 U/L (ref 0–44)
AST: 29 U/L (ref 15–41)
Albumin: 2.9 g/dL — ABNORMAL LOW (ref 3.5–5.0)
Alkaline Phosphatase: 92 U/L (ref 38–126)
Anion gap: 9 (ref 5–15)
BUN: 5 mg/dL — ABNORMAL LOW (ref 8–23)
CO2: 28 mmol/L (ref 22–32)
Calcium: 9.2 mg/dL (ref 8.9–10.3)
Chloride: 100 mmol/L (ref 98–111)
Creatinine, Ser: 0.93 mg/dL (ref 0.44–1.00)
GFR, Estimated: >60 mL/min
Glucose, Bld: 118 mg/dL — ABNORMAL HIGH (ref 70–99)
Potassium: 4.8 mmol/L (ref 3.5–5.1)
Sodium: 137 mmol/L (ref 135–145)
Total Bilirubin: 0.5 mg/dL (ref 0.0–1.2)
Total Protein: 7.1 g/dL (ref 6.5–8.1)

## 2024-03-31 LAB — GLUCOSE, CAPILLARY
Glucose-Capillary: 132 mg/dL — ABNORMAL HIGH (ref 70–99)
Glucose-Capillary: 121 mg/dL — ABNORMAL HIGH (ref 70–99)
Glucose-Capillary: 130 mg/dL — ABNORMAL HIGH (ref 70–99)
Glucose-Capillary: 161 mg/dL — ABNORMAL HIGH (ref 70–99)

## 2024-03-31 LAB — PHOSPHORUS: Phosphorus: 3.6 mg/dL (ref 2.5–4.6)

## 2024-03-31 LAB — MAGNESIUM: Magnesium: 1.9 mg/dL (ref 1.7–2.4)

## 2024-03-31 LAB — TRIGLYCERIDES: Triglycerides: 168 mg/dL — ABNORMAL HIGH

## 2024-03-31 MED ORDER — PANTOPRAZOLE SODIUM 40 MG IV SOLR
40.0000 mg | INTRAVENOUS | Status: DC
  Administered 2024-03-31 – 2024-04-04 (×5): 40 mg via INTRAVENOUS
  Filled 2024-03-31 (×5): qty 10

## 2024-03-31 MED ORDER — CHLORHEXIDINE GLUCONATE CLOTH 2 % EX PADS
6.0000 | MEDICATED_PAD | Freq: Every day | CUTANEOUS | Status: DC
  Administered 2024-03-31 – 2024-04-06 (×7): 6 via TOPICAL

## 2024-03-31 MED ORDER — MIDAZOLAM HCL (PF) 2 MG/2ML IJ SOLN
INTRAMUSCULAR | Status: AC | PRN
  Administered 2024-03-31: 1 mg via INTRAVENOUS

## 2024-03-31 MED ORDER — KCL IN DEXTROSE-NACL 20-5-0.45 MEQ/L-%-% IV SOLN
INTRAVENOUS | Status: AC
  Filled 2024-03-31 (×2): qty 1000

## 2024-03-31 MED ORDER — FENTANYL CITRATE (PF) 100 MCG/2ML IJ SOLN
INTRAMUSCULAR | Status: AC | PRN
  Administered 2024-03-31: 50 ug via INTRAVENOUS

## 2024-03-31 MED ORDER — POTASSIUM CHLORIDE 2 MEQ/ML IV SOLN
INTRAVENOUS | Status: DC
  Filled 2024-03-31: qty 1000

## 2024-03-31 MED ORDER — SODIUM CHLORIDE 0.9% FLUSH
5.0000 mL | Freq: Three times a day (TID) | INTRAVENOUS | Status: DC
  Administered 2024-03-31 – 2024-04-07 (×20): 5 mL

## 2024-03-31 MED ORDER — MIDAZOLAM HCL 2 MG/2ML IJ SOLN
INTRAMUSCULAR | Status: AC
  Filled 2024-03-31: qty 2

## 2024-03-31 MED ORDER — SODIUM CHLORIDE 0.9% FLUSH
10.0000 mL | Freq: Two times a day (BID) | INTRAVENOUS | Status: DC
  Administered 2024-03-31: 20 mL
  Administered 2024-03-31 – 2024-04-07 (×12): 10 mL

## 2024-03-31 MED ORDER — FENTANYL CITRATE (PF) 100 MCG/2ML IJ SOLN
INTRAMUSCULAR | Status: AC
  Filled 2024-03-31: qty 2

## 2024-03-31 MED ORDER — MAGNESIUM SULFATE IN D5W 1-5 GM/100ML-% IV SOLN
1.0000 g | Freq: Once | INTRAVENOUS | Status: AC
  Administered 2024-03-31: 1 g via INTRAVENOUS
  Filled 2024-03-31: qty 100

## 2024-03-31 MED ORDER — SODIUM CHLORIDE 0.9% FLUSH: 10.0000 mL | INTRAVENOUS | Status: DC | PRN

## 2024-03-31 MED ORDER — INSULIN ASPART 100 UNIT/ML IJ SOLN
0.0000 [IU] | Freq: Four times a day (QID) | INTRAMUSCULAR | Status: DC
  Administered 2024-03-31: 3 [IU] via SUBCUTANEOUS
  Administered 2024-04-01 (×2): 2 [IU] via SUBCUTANEOUS
  Administered 2024-04-01: 3 [IU] via SUBCUTANEOUS
  Administered 2024-04-02 (×2): 2 [IU] via SUBCUTANEOUS
  Filled 2024-03-31 (×2): qty 3
  Filled 2024-03-31 (×4): qty 2

## 2024-03-31 MED ORDER — THIAMINE HCL 100 MG/ML IJ SOLN
100.0000 mg | Freq: Every day | INTRAMUSCULAR | Status: DC
  Administered 2024-03-31 – 2024-04-01 (×2): 100 mg via INTRAVENOUS
  Filled 2024-03-31 (×2): qty 2

## 2024-03-31 MED ORDER — TRAVASOL 10 % IV SOLN
INTRAVENOUS | Status: AC
  Filled 2024-03-31: qty 489.6

## 2024-03-31 MED ORDER — KCL IN DEXTROSE-NACL 20-5-0.45 MEQ/L-%-% IV SOLN
INTRAVENOUS | Status: AC
  Filled 2024-03-31: qty 1000

## 2024-03-31 NOTE — Progress Notes (Signed)
 Initial Nutrition Assessment  DOCUMENTATION CODES:  Not applicable  INTERVENTION:  TPN management per pharmacy to meet 100% of estimated energy and protein needs. Monitor magnesium , potassium, and phosphorus daily for at least 3 days, MD to replete as needed, as pt is at risk for refeeding syndrome given NPO/CLD >7 days.  Add Thiamine  100 mg daily for 7 days Recommend obtaining new weight  NUTRITION DIAGNOSIS:  Inadequate oral intake related to inability to eat as evidenced by NPO status.  GOAL:  Patient will meet greater than or equal to 90% of their needs  MONITOR:  Diet advancement, I & O's, Labs, Weight trends  REASON FOR ASSESSMENT:  Consult New TPN/TNA  ASSESSMENT:  69 y.o. female presented to the ED with abdominal pain, nausea, and vomiting. PMH includes GERD, T2DM, anxiety, and depression. Pt admitted with a perforated appendicitis.   1/12 - Admitted 1/18 - dit advanced to Soft diet 1/20 - NPO; Op s/p laparoscopic appendectomy, drain placement for intra-abdominal abscess 1/21 - diet advanced to  full liquids 1/22 - NPO; NGT -> LIWS 1/24 - diet advanced to clear liquids; NGT clamped 1/25 - NGT removed; diet advanced to full liquids 1/26 - downgraded to clear liquids 1/27 - NPO; s/p pelvic abscess drained (15 cc removed); TPN initiated  Patient laying in bed at time of visit, husband at bedside. Patient reports that her appetite was good prior to admission. Shares that she enjoys foods. Reports that she typically has 2 meals per day and a snack in between. Does drink protein shakes at home and enjoys many flavors. Reports that she cut back on her carbs and sweets when she was diagnosis with diabetes in September.   Typical Intake Breakfast: Cereal (Raisin bran or frosted mini wheat's) Lunch: peanut butter crackers or rice cakes Dinner: salad with grilled chicken, spaghetti, pizza  Patient inquiring about being able to drink protein shakes currently. Discussed that  she is currently NPO and awaiting return of bowel function. Reports that she has had a little bit of gas and a few stools. Discussed plan to initiated TPN tonight and that all nutritional needs will be met that way.  Admission Weight: 84.8 kg Current Weight: 85.3 kg (1/20) Patient reports a 10# weight loss since starting Ozempic  in September.   Nutrition Related Medications: NovoLog  0-15 units TID with meals, Protonix , IV Zosyn , Thiamine   Labs: Sodium 137, Potassium 4.8, BUN 5, Creatinine 0.93, Phosphorus 3.6, Magnesium  1.9, Triglycerides 168, Hgb A1c 6.1  CBG: 108-132 mg/dL x 24 hrs    I/O's NGT output: 800 mL x 24 hrs JP drain output: 30 mL x 24 hrs  NUTRITION - FOCUSED PHYSICAL EXAM:  Flowsheet Row Most Recent Value  Orbital Region No depletion  Upper Arm Region No depletion  Thoracic and Lumbar Region No depletion  Buccal Region No depletion  Temple Region No depletion  Clavicle Bone Region No depletion  Clavicle and Acromion Bone Region No depletion  Scapular Bone Region No depletion  Dorsal Hand No depletion  Patellar Region Mild depletion  Anterior Thigh Region No depletion  Posterior Calf Region Mild depletion  Edema (RD Assessment) None  Hair Reviewed  Eyes Reviewed  Mouth Reviewed  Skin Reviewed  Nails Reviewed   Diet Order:   Diet Order             Diet NPO time specified Except for: Sips with Meds, Ice Chips, Other (See Comments)  Diet effective midnight  EDUCATION NEEDS:  Education needs have been addressed  Skin:  Skin Assessment: Reviewed RN Assessment  Last BM:  1/23  Height:  Ht Readings from Last 1 Encounters:  03/24/24 5' 4 (1.626 m)   Weight:  Wt Readings from Last 1 Encounters:  03/24/24 85.3 kg   Ideal Body Weight:  54.6 kg  BMI:  Body mass index is 32.27 kg/m.  Estimated Nutritional Needs:  Kcal:  2100-2300 Protein:  105-125 grams Fluid:  >/= 2 L   Nestora Glatter RD, LDN Registered Dietitian I Please  see AMION for contact information

## 2024-03-31 NOTE — Progress Notes (Signed)
 PHARMACY - TOTAL PARENTERAL NUTRITION CONSULT NOTE   Indication: Prolonged ileus  Patient Measurements: Height: 5' 4 (162.6 cm) Weight: 85.3 kg (188 lb) IBW/kg (Calculated) : 54.7 TPN AdjBW (KG): 62.3 Body mass index is 32.27 kg/m. Usual Weight: ~85 kg  Assessment: 54 YOF with a chief complaint of abd pain, N/V admitted w/ a perforated appendicitis who is now POD#7 with limited oral intake x9 days. Significant med PTA include Ozempic  [last dose 03/08/2024]. She was started on an insulin  sliding scale [moderate] w/ meals on 1/19. She was sent to IR 1/27 for a transgluteal pelvic abscess drain [removed 15 cc pus]. A consult is called to Pharmacy to manage TPN therapy while admitted  Glucose / Insulin : Hx of T2DM w/ HgbA1C of 6.1 on 03/23/2024. CBG's over the past 24 hours have ranged from 108-120. She has received 0 units of insulin  in the past 24 hours  Electrolytes: Na 137, Cl 100, K 4.8, CO2 28, Ca 9.2 [CoCa 10.08], Phos 3.6, mag 1.9 Renal: scr 0.93, BUN 5 Hepatic: alk phos, AST/ALT, Tbili wnl, Alb 2.9 Intake / Output; MIVF: D5 w/ 1/2 NS and 20 KCL / liter running at 100 ml/hr continuous / UOP not measured, emesis 850 mL, drains 30 mL, LBM 1/23 GI Imaging: 1/26 CT abd- 3+ abscess ileocolic mesentery and perirectal region, small bowel obs noted in duodenum and proximal jejunum GI Surgeries / Procedures: 1/20- lap appendectomy, lysis of adhesions  Central access: 1/27 TPN start date: 1/27  Nutritional Goals: Goal TPN rate is 90 mL/hr (provides 110 g of protein and 2276 kcals per day)  RD Assessment: Estimated Needs Total Energy Estimated Needs: 2100-2300 Total Protein Estimated Needs: 105-125 grams Total Fluid Estimated Needs: >/= 2 L  Current Nutrition:  NPO and TPN  Plan:  Start TPN at 40 mL/hr at 1800 which will provide ~45% of protein and Kcal needs for the day Electrolytes in TPN: Na 5mEq/L, K 26mEq/L, Ca 51mEq/L, Mg 10mEq/L, and Phos 20mmol/L. Cl:Ac 1:1 Add standard MVI  and trace elements to TPN Magnesium  1 g iv x1 Thiamine  100 mg iv qday x5 days Initiate Moderate q6h SSI and adjust as needed starting at 20:00 [scheduled times will be 00/06/12/18] Reduce MIVF to 60 mL/hr at 1800 Monitor TPN labs on Mon/Thurs, prn  Alaysia Lightle BS, PharmD, BCPS Clinical Pharmacist 03/31/2024 7:13 AM  Contact: [phone] after 3 PM

## 2024-03-31 NOTE — Plan of Care (Signed)
" °  Problem: Education: Goal: Knowledge of General Education information will improve Description: Including pain rating scale, medication(s)/side effects and non-pharmacologic comfort measures Outcome: Progressing   Problem: Health Behavior/Discharge Planning: Goal: Ability to manage health-related needs will improve Outcome: Progressing   Problem: Clinical Measurements: Goal: Ability to maintain clinical measurements within normal limits will improve Outcome: Progressing Goal: Will remain free from infection Outcome: Progressing Goal: Diagnostic test results will improve Outcome: Progressing Goal: Respiratory complications will improve Outcome: Progressing Goal: Cardiovascular complication will be avoided Outcome: Progressing   Problem: Activity: Goal: Risk for activity intolerance will decrease Outcome: Progressing   Problem: Nutrition: Goal: Adequate nutrition will be maintained Outcome: Progressing   Problem: Coping: Goal: Level of anxiety will decrease Outcome: Progressing   Problem: Elimination: Goal: Will not experience complications related to bowel motility Outcome: Progressing Goal: Will not experience complications related to urinary retention Outcome: Progressing   Problem: Pain Managment: Goal: General experience of comfort will improve and/or be controlled Outcome: Progressing   Problem: Safety: Goal: Ability to remain free from injury will improve Outcome: Progressing   Problem: Skin Integrity: Goal: Risk for impaired skin integrity will decrease Outcome: Progressing   Problem: Education: Goal: Ability to describe self-care measures that may prevent or decrease complications (Diabetes Survival Skills Education) will improve Outcome: Progressing Goal: Individualized Educational Video(s) Outcome: Progressing   Problem: Coping: Goal: Ability to adjust to condition or change in health will improve Outcome: Progressing   Problem: Fluid  Volume: Goal: Ability to maintain a balanced intake and output will improve Outcome: Progressing   Problem: Health Behavior/Discharge Planning: Goal: Ability to identify and utilize available resources and services will improve Outcome: Progressing Goal: Ability to manage health-related needs will improve Outcome: Progressing   Problem: Metabolic: Goal: Ability to maintain appropriate glucose levels will improve Outcome: Progressing   Problem: Nutritional: Goal: Maintenance of adequate nutrition will improve Outcome: Progressing Goal: Progress toward achieving an optimal weight will improve Outcome: Progressing   Problem: Skin Integrity: Goal: Risk for impaired skin integrity will decrease Outcome: Progressing   Problem: Tissue Perfusion: Goal: Adequacy of tissue perfusion will improve Outcome: Progressing   Problem: Education: Goal: Knowledge of the prescribed therapeutic regimen will improve Outcome: Progressing   Problem: Bowel/Gastric: Goal: Gastrointestinal status for postoperative course will improve Outcome: Progressing   Problem: Cardiac: Goal: Ability to maintain an adequate cardiac output Outcome: Progressing Goal: Will show no evidence of cardiac arrhythmias Outcome: Progressing   Problem: Nutritional: Goal: Will attain and maintain optimal nutritional status Outcome: Progressing   Problem: Neurological: Goal: Will regain or maintain usual level of consciousness Outcome: Progressing   Problem: Clinical Measurements: Goal: Ability to maintain clinical measurements within normal limits Outcome: Progressing Goal: Postoperative complications will be avoided or minimized Outcome: Progressing   Problem: Respiratory: Goal: Will regain and/or maintain adequate ventilation Outcome: Progressing Goal: Respiratory status will improve Outcome: Progressing   Problem: Skin Integrity: Goal: Demonstrates signs of wound healing without infection Outcome:  Progressing   Problem: Urinary Elimination: Goal: Will remain free from infection Outcome: Progressing Goal: Ability to achieve and maintain adequate urine output Outcome: Progressing   "

## 2024-03-31 NOTE — Progress Notes (Signed)
 7 Days Post-Op   Subjective/Chief Complaint: Some flatus and loose stool, drain in, picc in   Objective: Vital signs in last 24 hours: Temp:  [98.2 F (36.8 C)-98.7 F (37.1 C)] 98.5 F (36.9 C) (01/27 0914) Pulse Rate:  [92-110] 102 (01/27 0914) Resp:  [15-20] 15 (01/27 0855) BP: (115-164)/(66-88) 127/76 (01/27 0914) SpO2:  [95 %-100 %] 95 % (01/27 0914) Last BM Date : 03/27/24  Intake/Output from previous day: 01/26 0701 - 01/27 0700 In: -  Out: 880 [Emesis/NG output:850; Drains:30] Intake/Output this shift: No intake/output data recorded.  Ab soft less distended incisions clean, jp serosang  Lab Results:  Recent Labs    03/30/24 0215  WBC 20.1*  HGB 12.2  HCT 37.6  PLT 719*   BMET Recent Labs    03/30/24 0215 03/31/24 0449  NA 139 137  K 4.2 4.8  CL 101 100  CO2 26 28  GLUCOSE 136* 118*  BUN <5* 5*  CREATININE 0.84 0.93  CALCIUM 9.4 9.2   PT/INR No results for input(s): LABPROT, INR in the last 72 hours. ABG No results for input(s): PHART, HCO3 in the last 72 hours.  Invalid input(s): PCO2, PO2  Studies/Results: CT GUIDED PERITONEAL/RETROPERITONEAL FLUID DRAIN BY PERC CATH Result Date: 03/31/2024 INDICATION: Postop small pelvic abscess EXAM: CT DRAINAGE OF THE POSTERIOR PELVIC ABSCESS (RIGHT TRANS GLUTEAL APPROACH) MEDICATIONS: The patient is currently admitted to the hospital and receiving intravenous antibiotics. The antibiotics were administered within an appropriate time frame prior to the initiation of the procedure. ANESTHESIA/SEDATION: Moderate (conscious) sedation was employed during this procedure. A total of Versed  1.0 mg and Fentanyl  50 mcg was administered intravenously by the radiology nurse. Total intra-service moderate Sedation Time: 10 minutes. The patient's level of consciousness and vital signs were monitored continuously by radiology nursing throughout the procedure under my direct supervision. COMPLICATIONS: None immediate.  PROCEDURE: Informed written consent was obtained from the patient after a thorough discussion of the procedural risks, benefits and alternatives. All questions were addressed. Maximal Sterile Barrier Technique was utilized including caps, mask, sterile gowns, sterile gloves, sterile drape, hand hygiene and skin antiseptic. A timeout was performed prior to the initiation of the procedure. Patient positioned prone. Noncontrast localization CT performed. The cul-de-sac fluid collection was localized and marked for a right trans gluteal approach. Under sterile conditions and local anesthesia, CT guidance was utilized to advance the 18 gauge 15 cm access needle into the fluid collection. Needle position confirmed with CT. Syringe aspiration yielded purulent fluid. Sample sent for culture. Guidewire inserted followed by tract dilatation to insert a 10 French drain. Drain catheter position confirmed with CT. Catheter secured with a silk suture and a sterile dressing. No immediate complication. Patient tolerated the procedure well. IMPRESSION: Successful CT-guided pelvic abscess drain via right trans gluteal approach. Electronically Signed   By: CHRISTELLA.  Shick M.D.   On: 03/31/2024 10:15   DG Abd Portable 1V Result Date: 03/30/2024 EXAM: 1 VIEW XRAY OF THE ABDOMEN 03/30/2024 06:31:00 PM COMPARISON: 03/26/2024 CLINICAL HISTORY: Encounter for feeding tube placement. ICD10: 738535 Encounter for feeding tube placement. FINDINGS: LINES, TUBES AND DEVICES: Enteric tube in place with tip and side port projecting over the stomach. SOFT TISSUES: Cholecystectomy clips noted. No abnormal calcifications. BONES: No acute fracture. IMPRESSION: 1. Enteric tube in place with tip and side port projecting over the stomach. Electronically signed by: Oneil Devonshire MD 03/30/2024 06:44 PM EST RP Workstation: [id number]   US  EKG SITE RITE Result Date: 03/30/2024 If Site Rite  image not attached, placement could not be confirmed due to current  cardiac rhythm.  CT ABDOMEN PELVIS W CONTRAST Result Date: 03/30/2024 CLINICAL DATA:  Postop abdominal pain. EXAM: CT ABDOMEN AND PELVIS WITH CONTRAST TECHNIQUE: Multidetector CT imaging of the abdomen and pelvis was performed using the standard protocol following bolus administration of intravenous contrast. RADIATION DOSE REDUCTION: This exam was performed according to the departmental dose-optimization program which includes automated exposure control, adjustment of the mA and/or kV according to patient size and/or use of iterative reconstruction technique. CONTRAST:  65mL OMNIPAQUE  IOHEXOL  350 MG/ML SOLN COMPARISON:  03/19/2024. FINDINGS: Lower chest: Minimal dependent atelectasis bilaterally. Heart is at the upper limits of normal in size to mildly enlarged. No pericardial or pleural effusion. Distal esophagus is grossly unremarkable. Hepatobiliary: Liver is slightly decreased in attenuation diffusely. Pneumobilia, as before. Cholecystectomy. No unexpected biliary ductal dilatation. Pancreas: Negative. Spleen: Negative. Adrenals/Urinary Tract: Adrenal glands are unremarkable. Subcentimeter low-attenuation lesions in the kidneys. No specific follow-up necessary. Ureters are decompressed. Bladder is unremarkable. Stomach/Bowel: Stomach is distended with fluid. Marked dilatation of the duodenum and proximal jejunum with a transition point to normal caliber small bowel in the right mid abdomen (3/48). Appendectomy 03/24/2024 with an organized fluid collection in the ileocolic mesentery, measuring 1.7 x 5.0 cm (3/61). Percutaneous drain terminates slightly lateral to this fluid collection. A second fluid collection is seen somewhat superomedially, measuring 2.4 x 2.5 cm (3/54). Appendectomy. Colon is otherwise unremarkable. Vascular/Lymphatic: Atherosclerotic calcification of the aorta. No pathologically enlarged lymph nodes. Reproductive: No adnexal mass. Other: Small bilateral inguinal hernias contain fat.  Organized collection of perirectal fluid and air measures 3.3 x 4.9 cm (3/68). Adjacent peritoneal soft tissue thickening. Musculoskeletal: Degenerative changes in the spine. Bone island in the left pubic bone. IMPRESSION: 1. Interval appendectomy with at least 3 abscesses in the ileocolic mesentery and perirectal region. Percutaneous drain in the right anatomic pelvis. 2. Duodenum and proximal jejunum are markedly dilated and fluid-filled with a transition point in the right mid abdomen, indicative of a small bowel obstruction. 3. Hepatic steatosis. 4.  Aortic atherosclerosis (ICD10-I70.0). Electronically Signed   By: Newell Eke M.D.   On: 03/30/2024 14:22    Anti-infectives: Anti-infectives (From admission, onward)    Start     Dose/Rate Route Frequency Ordered Stop   03/27/24 1400  piperacillin -tazobactam (ZOSYN ) IVPB 3.375 g        3.375 g 12.5 mL/hr over 240 Minutes Intravenous Every 8 hours 03/27/24 0955     03/24/24 2200  piperacillin -tazobactam (ZOSYN ) IVPB 3.375 g  Status:  Discontinued        3.375 g 12.5 mL/hr over 240 Minutes Intravenous Every 8 hours 03/24/24 1833 03/27/24 0955   03/24/24 0830  piperacillin -tazobactam (ZOSYN ) IVPB 3.375 g        3.375 g 12.5 mL/hr over 240 Minutes Intravenous On call to O.R. 03/24/24 9176 03/24/24 0855   03/17/24 0200  piperacillin -tazobactam (ZOSYN ) IVPB 3.375 g        3.375 g 12.5 mL/hr over 240 Minutes Intravenous Every 8 hours 03/16/24 1928 03/24/24 0029   03/16/24 1730  piperacillin -tazobactam (ZOSYN ) IVPB 3.375 g        3.375 g 100 mL/hr over 30 Minutes Intravenous  Once 03/16/24 1719 03/16/24 1809       Assessment/Plan: POD 7 s/p dx lap with incidental salpingectomy and drainage of intra-abdominal abscess, Dr. Dasie for acute perforated appendicitis - path with fallopian tube and salpingitis with edema - drain in today for pelvic abscess,  other abscesses should be amenable to abx therapy -picc / tpn start today - Continue IV  abx - npo/ngt -mobilize, pulm toilet - repeat labs in AM   FEN - npo/IVFs VTE - Lovenox , SCDs ID - Zosyn   Donnice Bury 03/31/2024

## 2024-03-31 NOTE — Progress Notes (Signed)
 Peripherally Inserted Central Catheter Placement  The IV Nurse has discussed with the patient and/or persons authorized to consent for the patient, the purpose of this procedure and the potential benefits and risks involved with this procedure.  The benefits include less needle sticks, lab draws from the catheter, and the patient may be discharged home with the catheter. Risks include, but not limited to, infection, bleeding, blood clot (thrombus formation), and puncture of an artery; nerve damage and irregular heartbeat and possibility to perform a PICC exchange if needed/ordered by physician.  Alternatives to this procedure were also discussed.  Bard Power PICC patient education guide, fact sheet on infection prevention and patient information card has been provided to patient /or left at bedside.    PICC Placement Documentation  PICC Double Lumen 03/31/24 Right Basilic 38 cm 0 cm (Active)  Indication for Insertion or Continuance of Line Administration of hyperosmolar/irritating solutions (i.e. TPN, Vancomycin, etc.) 03/31/24 1219  Exposed Catheter (cm) 0 cm 03/31/24 1219  Site Assessment Clean, Dry, Intact 03/31/24 1219  Lumen #1 Status Flushed;Saline locked;Blood return noted 03/31/24 1219  Lumen #2 Status Flushed;Saline locked;Blood return noted 03/31/24 1219  Dressing Type Transparent;Securing device 03/31/24 1219  Dressing Status Antimicrobial disc/dressing in place;Clean, Dry, Intact 03/31/24 1219  Line Care Connections checked and tightened 03/31/24 1219  Dressing Intervention New dressing;Adhesive placed at insertion site (IV team only) 03/31/24 1219  Dressing Change Due 04/07/24 03/31/24 1219       Hobert Benders 03/31/2024, 12:21 PM

## 2024-03-31 NOTE — Procedures (Signed)
 Interventional Radiology Procedure Note  Procedure: CT RT TRANSGLUTEAL PELVIC ABSCESS DRAIN    Complications: None  Estimated Blood Loss:  0  Findings: 15CC PUS CX SENT     EMERSON FREDERIC SPECKING, MD

## 2024-03-31 NOTE — Plan of Care (Signed)
 Patient calm and cooperative A&O X4 with generalized weakness, patient's husband at bedside. NG tube maintained with low intermittent suction. Patient took bath this shift and washed hair with the help of her husband. PRN pain medications given to treat pain. Patient left with call bell in reach and bed in lowest position.  Problem: Education: Goal: Knowledge of General Education information will improve Description: Including pain rating scale, medication(s)/side effects and non-pharmacologic comfort measures Outcome: Progressing   Problem: Health Behavior/Discharge Planning: Goal: Ability to manage health-related needs will improve Outcome: Progressing   Problem: Activity: Goal: Risk for activity intolerance will decrease Outcome: Progressing   Problem: Nutrition: Goal: Adequate nutrition will be maintained Outcome: Progressing   Problem: Coping: Goal: Level of anxiety will decrease Outcome: Progressing   Problem: Elimination: Goal: Will not experience complications related to bowel motility Outcome: Progressing   Problem: Pain Managment: Goal: General experience of comfort will improve and/or be controlled Outcome: Progressing   Problem: Safety: Goal: Ability to remain free from injury will improve Outcome: Progressing   Problem: Education: Goal: Ability to describe self-care measures that may prevent or decrease complications (Diabetes Survival Skills Education) will improve Outcome: Progressing   Problem: Coping: Goal: Ability to adjust to condition or change in health will improve Outcome: Progressing   Problem: Health Behavior/Discharge Planning: Goal: Ability to identify and utilize available resources and services will improve Outcome: Progressing

## 2024-04-01 LAB — RENAL FUNCTION PANEL
Albumin: 3 g/dL — ABNORMAL LOW (ref 3.5–5.0)
Anion gap: 9 (ref 5–15)
BUN: 6 mg/dL — ABNORMAL LOW (ref 8–23)
CO2: 27 mmol/L (ref 22–32)
Calcium: 8.5 mg/dL — ABNORMAL LOW (ref 8.9–10.3)
Chloride: 99 mmol/L (ref 98–111)
Creatinine, Ser: 0.71 mg/dL (ref 0.44–1.00)
GFR, Estimated: >60 mL/min
Glucose, Bld: 127 mg/dL — ABNORMAL HIGH (ref 70–99)
Phosphorus: 3.2 mg/dL (ref 2.5–4.6)
Potassium: 4 mmol/L (ref 3.5–5.1)
Sodium: 136 mmol/L (ref 135–145)

## 2024-04-01 LAB — CBC
HCT: 33.3 % — ABNORMAL LOW (ref 36.0–46.0)
Hemoglobin: 10.7 g/dL — ABNORMAL LOW (ref 12.0–15.0)
MCH: 29.4 pg (ref 26.0–34.0)
MCHC: 32.1 g/dL (ref 30.0–36.0)
MCV: 91.5 fL (ref 80.0–100.0)
Platelets: 620 10*3/uL — ABNORMAL HIGH (ref 150–400)
RBC: 3.64 MIL/uL — ABNORMAL LOW (ref 3.87–5.11)
RDW: 13.3 % (ref 11.5–15.5)
WBC: 18.5 10*3/uL — ABNORMAL HIGH (ref 4.0–10.5)
nRBC: 0 % (ref 0.0–0.2)

## 2024-04-01 LAB — MAGNESIUM: Magnesium: 2 mg/dL (ref 1.7–2.4)

## 2024-04-01 LAB — GLUCOSE, CAPILLARY
Glucose-Capillary: 117 mg/dL — ABNORMAL HIGH (ref 70–99)
Glucose-Capillary: 153 mg/dL — ABNORMAL HIGH (ref 70–99)
Glucose-Capillary: 138 mg/dL — ABNORMAL HIGH (ref 70–99)
Glucose-Capillary: 137 mg/dL — ABNORMAL HIGH (ref 70–99)
Glucose-Capillary: 134 mg/dL — ABNORMAL HIGH (ref 70–99)
Glucose-Capillary: 131 mg/dL — ABNORMAL HIGH (ref 70–99)

## 2024-04-01 MED ORDER — TRAVASOL 10 % IV SOLN
INTRAVENOUS | Status: AC
  Filled 2024-04-01: qty 1122

## 2024-04-01 MED ORDER — ENOXAPARIN SODIUM 40 MG/0.4ML IJ SOSY
40.0000 mg | PREFILLED_SYRINGE | Freq: Every day | INTRAMUSCULAR | Status: DC
  Administered 2024-04-01 – 2024-04-07 (×7): 40 mg via SUBCUTANEOUS
  Filled 2024-04-01 (×7): qty 0.4

## 2024-04-01 NOTE — Plan of Care (Signed)
 Patient calm and cooperative A&O X4, family at bedside. Drains maintained. TPN continued, pain managed well this shift. Patient left with call bell in reach and bed in lowest position.   Problem: Education: Goal: Knowledge of General Education information will improve Description: Including pain rating scale, medication(s)/side effects and non-pharmacologic comfort measures Outcome: Progressing   Problem: Health Behavior/Discharge Planning: Goal: Ability to manage health-related needs will improve Outcome: Progressing   Problem: Clinical Measurements: Goal: Ability to maintain clinical measurements within normal limits will improve Outcome: Progressing   Problem: Activity: Goal: Risk for activity intolerance will decrease Outcome: Progressing   Problem: Nutrition: Goal: Adequate nutrition will be maintained Outcome: Progressing   Problem: Elimination: Goal: Will not experience complications related to bowel motility Outcome: Progressing   Problem: Pain Managment: Goal: General experience of comfort will improve and/or be controlled Outcome: Progressing   Problem: Safety: Goal: Ability to remain free from injury will improve Outcome: Progressing   Problem: Education: Goal: Ability to describe self-care measures that may prevent or decrease complications (Diabetes Survival Skills Education) will improve Outcome: Progressing   Problem: Education: Goal: Knowledge of the prescribed therapeutic regimen will improve Outcome: Progressing   Problem: Nutritional: Goal: Will attain and maintain optimal nutritional status Outcome: Progressing   Problem: Respiratory: Goal: Will regain and/or maintain adequate ventilation Outcome: Progressing

## 2024-04-01 NOTE — Plan of Care (Signed)
" °  Problem: Education: Goal: Knowledge of General Education information will improve Description: Including pain rating scale, medication(s)/side effects and non-pharmacologic comfort measures Outcome: Progressing   Problem: Health Behavior/Discharge Planning: Goal: Ability to manage health-related needs will improve Outcome: Progressing   Problem: Clinical Measurements: Goal: Ability to maintain clinical measurements within normal limits will improve Outcome: Progressing Goal: Will remain free from infection Outcome: Progressing Goal: Diagnostic test results will improve Outcome: Progressing Goal: Respiratory complications will improve Outcome: Progressing Goal: Cardiovascular complication will be avoided Outcome: Progressing   Problem: Activity: Goal: Risk for activity intolerance will decrease Outcome: Progressing   Problem: Nutrition: Goal: Adequate nutrition will be maintained Outcome: Progressing   Problem: Coping: Goal: Level of anxiety will decrease Outcome: Progressing   Problem: Elimination: Goal: Will not experience complications related to bowel motility Outcome: Progressing Goal: Will not experience complications related to urinary retention Outcome: Progressing   Problem: Pain Managment: Goal: General experience of comfort will improve and/or be controlled Outcome: Progressing   Problem: Safety: Goal: Ability to remain free from injury will improve Outcome: Progressing   Problem: Skin Integrity: Goal: Risk for impaired skin integrity will decrease Outcome: Progressing   Problem: Education: Goal: Ability to describe self-care measures that may prevent or decrease complications (Diabetes Survival Skills Education) will improve Outcome: Progressing Goal: Individualized Educational Video(s) Outcome: Progressing   Problem: Coping: Goal: Ability to adjust to condition or change in health will improve Outcome: Progressing   Problem: Fluid  Volume: Goal: Ability to maintain a balanced intake and output will improve Outcome: Progressing   Problem: Health Behavior/Discharge Planning: Goal: Ability to identify and utilize available resources and services will improve Outcome: Progressing Goal: Ability to manage health-related needs will improve Outcome: Progressing   Problem: Metabolic: Goal: Ability to maintain appropriate glucose levels will improve Outcome: Progressing   Problem: Nutritional: Goal: Maintenance of adequate nutrition will improve Outcome: Progressing Goal: Progress toward achieving an optimal weight will improve Outcome: Progressing   Problem: Skin Integrity: Goal: Risk for impaired skin integrity will decrease Outcome: Progressing   Problem: Tissue Perfusion: Goal: Adequacy of tissue perfusion will improve Outcome: Progressing   Problem: Education: Goal: Knowledge of the prescribed therapeutic regimen will improve Outcome: Progressing   Problem: Bowel/Gastric: Goal: Gastrointestinal status for postoperative course will improve Outcome: Progressing   Problem: Cardiac: Goal: Ability to maintain an adequate cardiac output Outcome: Progressing Goal: Will show no evidence of cardiac arrhythmias Outcome: Progressing   Problem: Nutritional: Goal: Will attain and maintain optimal nutritional status Outcome: Progressing   Problem: Neurological: Goal: Will regain or maintain usual level of consciousness Outcome: Progressing   Problem: Clinical Measurements: Goal: Ability to maintain clinical measurements within normal limits Outcome: Progressing Goal: Postoperative complications will be avoided or minimized Outcome: Progressing   Problem: Respiratory: Goal: Will regain and/or maintain adequate ventilation Outcome: Progressing Goal: Respiratory status will improve Outcome: Progressing   Problem: Skin Integrity: Goal: Demonstrates signs of wound healing without infection Outcome:  Progressing   Problem: Urinary Elimination: Goal: Will remain free from infection Outcome: Progressing Goal: Ability to achieve and maintain adequate urine output Outcome: Progressing   "

## 2024-04-01 NOTE — Progress Notes (Signed)
 8 Days Post-Op   Subjective/Chief Complaint: Some flatus and loose stool with urinating.  Feeling a bit better today.  Ambulating well   Objective: Vital signs in last 24 hours: Temp:  [98 F (36.7 C)-98.9 F (37.2 C)] 98 F (36.7 C) (01/28 0754) Pulse Rate:  [86-100] 92 (01/28 0754) Resp:  [16-17] 17 (01/28 0352) BP: (130-147)/(66-73) 130/72 (01/28 0754) SpO2:  [95 %-98 %] 95 % (01/28 0754) Weight:  [82.7 kg] 82.7 kg (01/28 0352) Last BM Date : 03/27/24  Intake/Output from previous day: 01/27 0701 - 01/28 0700 In: 1264.9 [I.V.:601.2; IV Piggyback:663.7] Out: 735 [Emesis/NG output:700; Drains:35] Intake/Output this shift: No intake/output data recorded.  Ab soft, ND, not really tender, incisions clean, jp serous with almost no output, IR drain with no output either  Lab Results:  Recent Labs    03/30/24 0215 04/01/24 0053  WBC 20.1* 18.5*  HGB 12.2 10.7*  HCT 37.6 33.3*  PLT 719* 620*   BMET Recent Labs    03/31/24 0449 04/01/24 0053  NA 137 136  K 4.8 4.0  CL 100 99  CO2 28 27  GLUCOSE 118* 127*  BUN 5* 6*  CREATININE 0.93 0.71  CALCIUM 9.2 8.5*   PT/INR No results for input(s): LABPROT, INR in the last 72 hours. ABG No results for input(s): PHART, HCO3 in the last 72 hours.  Invalid input(s): PCO2, PO2  Studies/Results: CT GUIDED PERITONEAL/RETROPERITONEAL FLUID DRAIN BY PERC CATH Result Date: 03/31/2024 INDICATION: Postop small pelvic abscess EXAM: CT DRAINAGE OF THE POSTERIOR PELVIC ABSCESS (RIGHT TRANS GLUTEAL APPROACH) MEDICATIONS: The patient is currently admitted to the hospital and receiving intravenous antibiotics. The antibiotics were administered within an appropriate time frame prior to the initiation of the procedure. ANESTHESIA/SEDATION: Moderate (conscious) sedation was employed during this procedure. A total of Versed  1.0 mg and Fentanyl  50 mcg was administered intravenously by the radiology nurse. Total intra-service moderate  Sedation Time: 10 minutes. The patient's level of consciousness and vital signs were monitored continuously by radiology nursing throughout the procedure under my direct supervision. COMPLICATIONS: None immediate. PROCEDURE: Informed written consent was obtained from the patient after a thorough discussion of the procedural risks, benefits and alternatives. All questions were addressed. Maximal Sterile Barrier Technique was utilized including caps, mask, sterile gowns, sterile gloves, sterile drape, hand hygiene and skin antiseptic. A timeout was performed prior to the initiation of the procedure. Patient positioned prone. Noncontrast localization CT performed. The cul-de-sac fluid collection was localized and marked for a right trans gluteal approach. Under sterile conditions and local anesthesia, CT guidance was utilized to advance the 18 gauge 15 cm access needle into the fluid collection. Needle position confirmed with CT. Syringe aspiration yielded purulent fluid. Sample sent for culture. Guidewire inserted followed by tract dilatation to insert a 10 French drain. Drain catheter position confirmed with CT. Catheter secured with a silk suture and a sterile dressing. No immediate complication. Patient tolerated the procedure well. IMPRESSION: Successful CT-guided pelvic abscess drain via right trans gluteal approach. Electronically Signed   By: CHRISTELLA.  Shick M.D.   On: 03/31/2024 10:15   DG Abd Portable 1V Result Date: 03/30/2024 EXAM: 1 VIEW XRAY OF THE ABDOMEN 03/30/2024 06:31:00 PM COMPARISON: 03/26/2024 CLINICAL HISTORY: Encounter for feeding tube placement. ICD10: 738535 Encounter for feeding tube placement. FINDINGS: LINES, TUBES AND DEVICES: Enteric tube in place with tip and side port projecting over the stomach. SOFT TISSUES: Cholecystectomy clips noted. No abnormal calcifications. BONES: No acute fracture. IMPRESSION: 1. Enteric tube in  place with tip and side port projecting over the stomach.  Electronically signed by: Oneil Devonshire MD 03/30/2024 06:44 PM EST RP Workstation: [id number]   US  EKG SITE RITE Result Date: 03/30/2024 If Site Rite image not attached, placement could not be confirmed due to current cardiac rhythm.  CT ABDOMEN PELVIS W CONTRAST Result Date: 03/30/2024 CLINICAL DATA:  Postop abdominal pain. EXAM: CT ABDOMEN AND PELVIS WITH CONTRAST TECHNIQUE: Multidetector CT imaging of the abdomen and pelvis was performed using the standard protocol following bolus administration of intravenous contrast. RADIATION DOSE REDUCTION: This exam was performed according to the departmental dose-optimization program which includes automated exposure control, adjustment of the mA and/or kV according to patient size and/or use of iterative reconstruction technique. CONTRAST:  65mL OMNIPAQUE  IOHEXOL  350 MG/ML SOLN COMPARISON:  03/19/2024. FINDINGS: Lower chest: Minimal dependent atelectasis bilaterally. Heart is at the upper limits of normal in size to mildly enlarged. No pericardial or pleural effusion. Distal esophagus is grossly unremarkable. Hepatobiliary: Liver is slightly decreased in attenuation diffusely. Pneumobilia, as before. Cholecystectomy. No unexpected biliary ductal dilatation. Pancreas: Negative. Spleen: Negative. Adrenals/Urinary Tract: Adrenal glands are unremarkable. Subcentimeter low-attenuation lesions in the kidneys. No specific follow-up necessary. Ureters are decompressed. Bladder is unremarkable. Stomach/Bowel: Stomach is distended with fluid. Marked dilatation of the duodenum and proximal jejunum with a transition point to normal caliber small bowel in the right mid abdomen (3/48). Appendectomy 03/24/2024 with an organized fluid collection in the ileocolic mesentery, measuring 1.7 x 5.0 cm (3/61). Percutaneous drain terminates slightly lateral to this fluid collection. A second fluid collection is seen somewhat superomedially, measuring 2.4 x 2.5 cm (3/54). Appendectomy. Colon  is otherwise unremarkable. Vascular/Lymphatic: Atherosclerotic calcification of the aorta. No pathologically enlarged lymph nodes. Reproductive: No adnexal mass. Other: Small bilateral inguinal hernias contain fat. Organized collection of perirectal fluid and air measures 3.3 x 4.9 cm (3/68). Adjacent peritoneal soft tissue thickening. Musculoskeletal: Degenerative changes in the spine. Bone island in the left pubic bone. IMPRESSION: 1. Interval appendectomy with at least 3 abscesses in the ileocolic mesentery and perirectal region. Percutaneous drain in the right anatomic pelvis. 2. Duodenum and proximal jejunum are markedly dilated and fluid-filled with a transition point in the right mid abdomen, indicative of a small bowel obstruction. 3. Hepatic steatosis. 4.  Aortic atherosclerosis (ICD10-I70.0). Electronically Signed   By: Newell Eke M.D.   On: 03/30/2024 14:22    Anti-infectives: Anti-infectives (From admission, onward)    Start     Dose/Rate Route Frequency Ordered Stop   03/27/24 1400  piperacillin -tazobactam (ZOSYN ) IVPB 3.375 g        3.375 g 12.5 mL/hr over 240 Minutes Intravenous Every 8 hours 03/27/24 0955     03/24/24 2200  piperacillin -tazobactam (ZOSYN ) IVPB 3.375 g  Status:  Discontinued        3.375 g 12.5 mL/hr over 240 Minutes Intravenous Every 8 hours 03/24/24 1833 03/27/24 0955   03/24/24 0830  piperacillin -tazobactam (ZOSYN ) IVPB 3.375 g        3.375 g 12.5 mL/hr over 240 Minutes Intravenous On call to O.R. 03/24/24 0823 03/24/24 0855   03/17/24 0200  piperacillin -tazobactam (ZOSYN ) IVPB 3.375 g        3.375 g 12.5 mL/hr over 240 Minutes Intravenous Every 8 hours 03/16/24 1928 03/24/24 0029   03/16/24 1730  piperacillin -tazobactam (ZOSYN ) IVPB 3.375 g        3.375 g 100 mL/hr over 30 Minutes Intravenous  Once 03/16/24 1719 03/16/24 1809       Assessment/Plan:  POD 8, s/p dx lap with incidental salpingectomy and drainage of intra-abdominal abscess, Dr. Dasie for  acute perforated appendicitis - path with fallopian tube and salpingitis with edema - IR drain 1/27for pelvic abscess, other abscesses should be amenable to abx therapy.  Await cultures -picc / tpn due to ileus - Continue IV abx - npo/ngt -mobilize, pulm toilet - WBC down today to 18K from 20K -mobilize   FEN - npo/IVFs/NGT/TNA VTE - Lovenox , SCDs ID - Zosyn   Burnard FORBES Banter 04/01/2024

## 2024-04-01 NOTE — Progress Notes (Signed)
 "   Referring Physician(s): Dr. Donnice Bury  Supervising Physician: Vanice Revel  Patient Status:  [facility] - In-[facility]  Chief Complaint:  Pelvic is   Subjective:  Patient laying in bed resting. States that she is feeling stronger  Allergies: Patient has no known allergies.  Medications: Prior to Admission medications  Medication Sig Start Date End Date Taking? Authorizing Provider  acetaminophen  (TYLENOL ) 500 MG tablet Take 2 tablets (1,000 mg total) by mouth every 6 (six) hours as needed. Patient taking differently: Take 500 mg by mouth every 6 (six) hours as needed for mild pain (pain score 1-3). 09/23/20  Yes Tammy Sor, PA-C  Cholecalciferol (VITAMIN D) 50 MCG (2000 UT) CAPS Take by mouth.   Yes [provider]  Cyanocobalamin (B-12) 100 MCG TABS Take 1 tablet by mouth daily.   Yes [provider]  DULoxetine  (CYMBALTA ) 60 MG capsule Take 2 capsules (120 mg total) by mouth daily. Patient taking differently: Take 60 mg by mouth daily. 10/17/23  Yes Dettinger, Fonda LABOR, MD  estradiol  (ESTRACE ) 0.5 MG tablet Take 0.5 mg by mouth daily. 01/16/24  Yes [provider]  ibuprofen (ADVIL) 200 MG tablet Take 400-600 mg by mouth every 6 (six) hours as needed for fever, headache or mild pain.   Yes [provider]  omega-3 acid ethyl esters (LOVAZA) 1 g capsule Take 1 g by mouth daily.   Yes [provider]  omeprazole  (PRILOSEC) 20 MG capsule Take 1 capsule (20 mg total) by mouth daily. 10/17/23  Yes Dettinger, Fonda LABOR, MD  pregabalin  (LYRICA ) 100 MG capsule Take 1 capsule (100 mg total) by mouth 2 (two) times daily. 09/10/23  Yes Patel, Donika K, DO  Semaglutide ,0.25 or 0.5MG /DOS, 2 MG/3ML SOPN Inject 0.25 mg into the skin once a week. 10/17/23  Yes Dettinger, Fonda LABOR, MD  estradiol  (ESTRACE ) 2 MG tablet Take 1 mg by mouth daily. Patient not taking: Reported on 03/16/2024 08/03/20   [provider]  ondansetron  (ZOFRAN -ODT) 8 MG  disintegrating tablet 8mg  ODT q4 hours prn nausea Patient not taking: Reported on 03/16/2024 03/13/23   [name], MD     Vital Signs: BP 130/72 (BP Location: Left Arm)   Pulse 92   Temp 98 F (36.7 C) (Oral)   Resp 17   Ht 5' 4 (1.626 m)   Wt 182 lb 5.1 oz (82.7 kg)   SpO2 95%   BMI 31.30 kg/m   Physical Exam Vitals and nursing note reviewed.  Constitutional:      Appearance: She is well-developed.  HENT:     Head: Normocephalic and atraumatic.  Eyes:     Conjunctiva/sclera: Conjunctivae normal.  Pulmonary:     Effort: Pulmonary effort is normal.  Abdominal:     Comments: Positive right transgluteal pelvic drain to suction. Site is unremarkable with no erythema, edema, tenderness, bleeding or drainage noted at exit site. Suture and stat lock in place. Dressing is clean dry and intact. Minimal amount of amber serosanguinous fluid noted to be in the line of the bulb suction device Insertion site clean and dry.     Musculoskeletal:     Cervical back: Normal range of motion.  Neurological:     Mental Status: She is alert and oriented to person, place, and time.     Imaging: CT GUIDED PERITONEAL/RETROPERITONEAL FLUID DRAIN BY PERC CATH Result Date: 03/31/2024 INDICATION: Postop small pelvic abscess EXAM: CT DRAINAGE OF THE POSTERIOR PELVIC ABSCESS (RIGHT TRANS GLUTEAL APPROACH) MEDICATIONS: The patient  is currently admitted to the [facility] and receiving intravenous antibiotics. The antibiotics were administered within an appropriate time frame prior to the initiation of the procedure. ANESTHESIA/SEDATION: Moderate (conscious) sedation was employed during this procedure. A total of Versed  1.0 mg and Fentanyl  50 mcg was administered intravenously by the radiology nurse. Total intra-service moderate Sedation Time: 10 minutes. The patient's level of consciousness and vital signs were monitored continuously by radiology nursing throughout the procedure under my direct supervision.  COMPLICATIONS: None immediate. PROCEDURE: Informed written consent was obtained from the patient after a thorough discussion of the procedural risks, benefits and alternatives. All questions were addressed. Maximal Sterile Barrier Technique was utilized including caps, mask, sterile gowns, sterile gloves, sterile drape, hand hygiene and skin antiseptic. A timeout was performed prior to the initiation of the procedure. Patient positioned prone. Noncontrast localization CT performed. The cul-de-sac fluid collection was localized and marked for a right trans gluteal approach. Under sterile conditions and local anesthesia, CT guidance was utilized to advance the 18 gauge 15 cm access needle into the fluid collection. Needle position confirmed with CT. Syringe aspiration yielded purulent fluid. Sample sent for culture. Guidewire inserted followed by tract dilatation to insert a 10 French drain. Drain catheter position confirmed with CT. Catheter secured with a silk suture and a sterile dressing. No immediate complication. Patient tolerated the procedure well. IMPRESSION: Successful CT-guided pelvic abscess drain via right trans gluteal approach. Electronically Signed   By: CHRISTELLA.  Shick M.D.   On: 03/31/2024 10:15   DG Abd Portable 1V Result Date: 03/30/2024 EXAM: 1 VIEW XRAY OF THE ABDOMEN 03/30/2024 06:31:00 PM COMPARISON: 03/26/2024 CLINICAL HISTORY: Encounter for feeding tube placement. ICD10: 738535 Encounter for feeding tube placement. FINDINGS: LINES, TUBES AND DEVICES: Enteric tube in place with tip and side port projecting over the stomach. SOFT TISSUES: Cholecystectomy clips noted. No abnormal calcifications. BONES: No acute fracture. IMPRESSION: 1. Enteric tube in place with tip and side port projecting over the stomach. Electronically signed by: Oneil Devonshire MD 03/30/2024 06:44 PM EST RP Workstation: [id number]   US  EKG SITE RITE Result Date: 03/30/2024 If Site Rite image not attached, placement could not be  confirmed due to current cardiac rhythm.  CT ABDOMEN PELVIS W CONTRAST Result Date: 03/30/2024 CLINICAL DATA:  Postop abdominal pain. EXAM: CT ABDOMEN AND PELVIS WITH CONTRAST TECHNIQUE: Multidetector CT imaging of the abdomen and pelvis was performed using the standard protocol following bolus administration of intravenous contrast. RADIATION DOSE REDUCTION: This exam was performed according to the departmental dose-optimization program which includes automated exposure control, adjustment of the mA and/or kV according to patient size and/or use of iterative reconstruction technique. CONTRAST:  65mL OMNIPAQUE  IOHEXOL  350 MG/ML SOLN COMPARISON:  03/19/2024. FINDINGS: Lower chest: Minimal dependent atelectasis bilaterally. Heart is at the upper limits of normal in size to mildly enlarged. No pericardial or pleural effusion. Distal esophagus is grossly unremarkable. Hepatobiliary: Liver is slightly decreased in attenuation diffusely. Pneumobilia, as before. Cholecystectomy. No unexpected biliary ductal dilatation. Pancreas: Negative. Spleen: Negative. Adrenals/Urinary Tract: Adrenal glands are unremarkable. Subcentimeter low-attenuation lesions in the kidneys. No specific follow-up necessary. Ureters are decompressed. Bladder is unremarkable. Stomach/Bowel: Stomach is distended with fluid. Marked dilatation of the duodenum and proximal jejunum with a transition point to normal caliber small bowel in the right mid abdomen (3/48). Appendectomy 03/24/2024 with an organized fluid collection in the ileocolic mesentery, measuring 1.7 x 5.0 cm (3/61). Percutaneous drain terminates slightly lateral to this fluid collection. A second fluid collection is seen somewhat  superomedially, measuring 2.4 x 2.5 cm (3/54). Appendectomy. Colon is otherwise unremarkable. Vascular/Lymphatic: Atherosclerotic calcification of the aorta. No pathologically enlarged lymph nodes. Reproductive: No adnexal mass. Other: Small bilateral inguinal  hernias contain fat. Organized collection of perirectal fluid and air measures 3.3 x 4.9 cm (3/68). Adjacent peritoneal soft tissue thickening. Musculoskeletal: Degenerative changes in the spine. Bone island in the left pubic bone. IMPRESSION: 1. Interval appendectomy with at least 3 abscesses in the ileocolic mesentery and perirectal region. Percutaneous drain in the right anatomic pelvis. 2. Duodenum and proximal jejunum are markedly dilated and fluid-filled with a transition point in the right mid abdomen, indicative of a small bowel obstruction. 3. Hepatic steatosis. 4.  Aortic atherosclerosis (ICD10-I70.0). Electronically Signed   By: Newell Eke M.D.   On: 03/30/2024 14:22    Labs:  CBC: Recent Labs    03/27/24 0446 03/28/24 0528 03/30/24 0215 04/01/24 0053  WBC 25.3* 21.2* 20.1* 18.5*  HGB 12.0 11.5* 12.2 10.7*  HCT 36.5 35.7* 37.6 33.3*  PLT 592* 624* 719* 620*    COAGS: Recent Labs    03/20/24 0508  INR 1.0    BMP: Recent Labs    03/28/24 0528 03/30/24 0215 03/31/24 0449 04/01/24 0053  NA 138 139 137 136  K 3.9 4.2 4.8 4.0  CL 100 101 100 99  CO2 30 26 28 27   GLUCOSE 120* 136* 118* 127*  BUN <5* <5* 5* 6*  CALCIUM 8.8* 9.4 9.2 8.5*  CREATININE 0.73 0.84 0.93 0.71  GFRNONAA >60 >60 >60 >60    LIVER FUNCTION TESTS: Recent Labs    10/17/23 1042 01/24/24 0949 03/16/24 1513 03/31/24 0449 04/01/24 0053  BILITOT 0.5 0.6 1.3* 0.5  --   AST 45* 22 18 29   --   ALT 43* 26 14 19   --   ALKPHOS 96 87 106 92  --   PROT 7.1 7.3 7.8 7.1  --   ALBUMIN 3.8* 3.9 3.6 2.9* 3.0*    Assessment and Plan:  69 y.o. female inpatient. s/p  lap with ultimate salpingectomy and drainage of intra-abdominal abscess with perforated appendicitis by Dr. Dasie. Now with ongoing abdominal pain, no return of bowel function and leukocytosis. Found to have pelvic abscess. S/P IR percutaneous abscess drain placement on 1.27.26  Drain Location: right transgluteal pelvic drain Size:  Fr size: 10 Fr Date of placement: 1.27.26  Currently to: Drain collection device: suction bulb  24 hour output:  Output by Drain (mL) 03/30/24 0700 - 03/30/24 1459 03/30/24 1500 - 03/30/24 2259 03/30/24 2300 - 03/31/24 0659 03/31/24 0700 - 03/31/24 1459 03/31/24 1500 - 03/31/24 2259 03/31/24 2300 - 04/01/24 0659 04/01/24 0700 - 04/01/24 0823  Closed System Drain 1 Left;Lateral Abdomen Bulb (JP) 19 Fr.  25 5  22 3    Closed System Drain 1 Inferior;Right Back Bulb (JP) 10 Fr.     7 3   WBC is 18.5. cultures pending.   Interval imaging/drain manipulation:  None since drain placement  Current examination: Insertion site unremarkable. Suture and stat lock in place. Dressed appropriately.   Plan: Continue TID flushes with 5 cc NS. Record output Q shift. Dressing changes QD or PRN if soiled.  Call IR APP or on call IR MD if difficulty flushing or sudden change in drain output.  Repeat imaging/possible drain injection once output < 10 mL/QD (excluding flush material). Consideration for drain removal if output is < 10 mL/QD (excluding flush material), pending discussion with the providing surgical service.  Discharge planning:  Please contact IR APP or on call IR MD prior to patient d/c to ensure appropriate follow up plans are in place. Typically patient will follow up with IR clinic 10-14 days post d/c for repeat imaging/possible drain injection. IR scheduler will contact patient with date/time of appointment. Patient will need to flush drain QD with 5 cc NS, record output QD, dressing changes every 2-3 days or earlier if soiled.   IR will continue to follow - please call with questions or concerns.     Electronically Signed: Delon JAYSON Beagle, NP 04/01/2024, 8:22 AM   I spent a total of 15 Minutes at the the patient's bedside AND on the patient's [facility] floor or unit, greater than 50% of which was counseling/coordinating care for Right transgluteal pelvic drain.       "

## 2024-04-01 NOTE — Progress Notes (Signed)
 PHARMACY - TOTAL PARENTERAL NUTRITION CONSULT NOTE   Indication: Small bowel obstruction  Patient Measurements: Height: 5' 4 (162.6 cm) Weight: 82.7 kg (182 lb 5.1 oz) IBW/kg (Calculated) : 54.7 TPN AdjBW (KG): 62.3 Body mass index is 31.3 kg/m. Usual Weight: ~85 kg  Assessment:  29 YOF with a chief complaint of abd pain, N/V admitted w/ a perforated appendicitis s/p laparoscopic appendectomy, intra-abdominal abscess w/ drain placement 03/24/24. Postop imaging shows continued SBO. Patient with limited oral intake x9 days. Significant med PTA include Ozempic  [last dose 03/08/2024].  Patient has been on D5 fluids receiving ~120g dextrose /d for 5.5 days, which greatly decreases refeeding risk. Pharmacy consulted for TPN.   Glucose / Insulin : BG < 180, used 8 units SSI/24hrs Hx of T2DM w/ HgbA1C of 6.1 on 03/23/2024.   Electrolytes: K 4 down (received ~17 mEq K in IV Fluid), CoCa 9.3, Mg 2 (received 1g), others wnl  Renal: Scr 0.71, BUN 6 Hepatic: alk phos/AST/AL/ Tbili wnl, Alb 3, TG 168 Intake / Output; MIVF: D5 w/ 1/2 NS and 20 KCL / liter running at 100 ml/hr continuous / UOP not measured, NGT 700 mL, drains 35 mL, LBM 1/27 GI Imaging: 1/12 CT- perforated appendicitis, multiple pockets of gas, no abscess 1/15 CT- perforated appendicitis with enlargement of fluid/gas collection, developing SBO 1/22 KUB- Multiple dilated gas-filled small bowel segments concerning for SBO 1/26 CT abd- 3+ abscess ileocolic mesentery and perirectal region, SBO in duodenum and proximal jejunum  GI Surgeries / Procedures: 1/20- lap appendectomy, lysis of adhesions, drain placed for abscess  1/27 CT guided pelvic abscess drain -15 ml out  Central access: 1/27 TPN start date: 1/27  Nutritional Goals: Goal TPN rate is 85 mL/hr (provides 112 g of protein and 2107 kcals per day)  RD Assessment: Estimated Needs Total Energy Estimated Needs: 2100-2300 Total Protein Estimated Needs: 105-125 grams Total Fluid  Estimated Needs: >/= 2 L  Current Nutrition:  NPO and TPN  Plan:  Advance to goal TPN 85 mL/hr at 1800, provides 100% estimated needs Electrolytes in TPN: increase Na 100 mEq/L, increase K 50 mEq/L, decrease Ca 2 mEq/L, increase Mg 6 mEq/L, increase Phos 15 mmol/L. Cl:Ac 1:1 Add standard MVI and trace elements to TPN Stop thiamine  given low refeeding risk (was on ~120g dextrose /d via D5 fluids x5.5 days before starting TPN)  Continue Moderate q6h SSI and stop if not needed   Stop D5half+20K at 1800 Monitor TPN labs daily until stable at goal then on Mon/Thurs  Jinnie Door, PharmD, BCPS, [facility] Clinical Pharmacist  Please check AMION for all [facility] Pharmacy phone numbers After 10:00 PM, call Main Pharmacy [phone]

## 2024-04-02 LAB — CBC
HCT: 32.8 % — ABNORMAL LOW (ref 36.0–46.0)
Hemoglobin: 10.5 g/dL — ABNORMAL LOW (ref 12.0–15.0)
MCH: 29.6 pg (ref 26.0–34.0)
MCHC: 32 g/dL (ref 30.0–36.0)
MCV: 92.4 fL (ref 80.0–100.0)
Platelets: 589 10*3/uL — ABNORMAL HIGH (ref 150–400)
RBC: 3.55 MIL/uL — ABNORMAL LOW (ref 3.87–5.11)
RDW: 13.2 % (ref 11.5–15.5)
WBC: 13.6 10*3/uL — ABNORMAL HIGH (ref 4.0–10.5)
nRBC: 0 % (ref 0.0–0.2)

## 2024-04-02 LAB — GLUCOSE, CAPILLARY
Glucose-Capillary: 132 mg/dL — ABNORMAL HIGH (ref 70–99)
Glucose-Capillary: 140 mg/dL — ABNORMAL HIGH (ref 70–99)
Glucose-Capillary: 167 mg/dL — ABNORMAL HIGH (ref 70–99)

## 2024-04-02 LAB — COMPREHENSIVE METABOLIC PANEL WITH GFR
ALT: 21 U/L (ref 0–44)
AST: 22 U/L (ref 15–41)
Albumin: 3.1 g/dL — ABNORMAL LOW (ref 3.5–5.0)
Alkaline Phosphatase: 132 U/L — ABNORMAL HIGH (ref 38–126)
Anion gap: 9 (ref 5–15)
BUN: 12 mg/dL (ref 8–23)
CO2: 27 mmol/L (ref 22–32)
Calcium: 8.7 mg/dL — ABNORMAL LOW (ref 8.9–10.3)
Chloride: 102 mmol/L (ref 98–111)
Creatinine, Ser: 0.71 mg/dL (ref 0.44–1.00)
GFR, Estimated: >60 mL/min
Glucose, Bld: 140 mg/dL — ABNORMAL HIGH (ref 70–99)
Potassium: 4.2 mmol/L (ref 3.5–5.1)
Sodium: 137 mmol/L (ref 135–145)
Total Bilirubin: 0.4 mg/dL (ref 0.0–1.2)
Total Protein: 7.3 g/dL (ref 6.5–8.1)

## 2024-04-02 LAB — PHOSPHORUS: Phosphorus: 2.9 mg/dL (ref 2.5–4.6)

## 2024-04-02 LAB — MAGNESIUM: Magnesium: 2.3 mg/dL (ref 1.7–2.4)

## 2024-04-02 MED ORDER — TRAVASOL 10 % IV SOLN
INTRAVENOUS | Status: AC
  Filled 2024-04-02: qty 1122

## 2024-04-02 NOTE — Progress Notes (Signed)
 9 Days Post-Op   Subjective/Chief Complaint: Some flatus, fair amount of stool   Objective: Vital signs in last 24 hours: Temp:  [97.6 F (36.4 C)-98.9 F (37.2 C)] 98 F (36.7 C) (01/29 0744) Pulse Rate:  [85-97] 92 (01/29 0744) Resp:  [19-20] 19 (01/29 0606) BP: (140-146)/(67-82) 140/73 (01/29 0744) SpO2:  [93 %-95 %] 95 % (01/29 0744) Weight:  [82.7 kg] 82.7 kg (01/29 0500) Last BM Date : 04/01/24  Intake/Output from previous day: 01/28 0701 - 01/29 0700 In: 2265.9 [I.V.:2132; IV Piggyback:133.9] Out: 150 [Emesis/NG output:150] Intake/Output this shift: Total I/O In: -  Out: 65 [Emesis/NG output:50; Drains:15]  Ab soft approp tender drains clear incisions clear  Lab Results:  Recent Labs    04/01/24 0053 04/02/24 0400  WBC 18.5* 13.6*  HGB 10.7* 10.5*  HCT 33.3* 32.8*  PLT 620* 589*   BMET Recent Labs    04/01/24 0053 04/02/24 0400  NA 136 137  K 4.0 4.2  CL 99 102  CO2 27 27  GLUCOSE 127* 140*  BUN 6* 12  CREATININE 0.71 0.71  CALCIUM 8.5* 8.7*   PT/INR No results for input(s): LABPROT, INR in the last 72 hours. ABG No results for input(s): PHART, HCO3 in the last 72 hours.  Invalid input(s): PCO2, PO2  Studies/Results: No results found.  Anti-infectives: Anti-infectives (From admission, onward)    Start     Dose/Rate Route Frequency Ordered Stop   03/27/24 1400  piperacillin -tazobactam (ZOSYN ) IVPB 3.375 g        3.375 g 12.5 mL/hr over 240 Minutes Intravenous Every 8 hours 03/27/24 0955     03/24/24 2200  piperacillin -tazobactam (ZOSYN ) IVPB 3.375 g  Status:  Discontinued        3.375 g 12.5 mL/hr over 240 Minutes Intravenous Every 8 hours 03/24/24 1833 03/27/24 0955   03/24/24 0830  piperacillin -tazobactam (ZOSYN ) IVPB 3.375 g        3.375 g 12.5 mL/hr over 240 Minutes Intravenous On call to O.R. 03/24/24 0823 03/24/24 0855   03/17/24 0200  piperacillin -tazobactam (ZOSYN ) IVPB 3.375 g        3.375 g 12.5 mL/hr over 240  Minutes Intravenous Every 8 hours 03/16/24 1928 03/24/24 0029   03/16/24 1730  piperacillin -tazobactam (ZOSYN ) IVPB 3.375 g        3.375 g 100 mL/hr over 30 Minutes Intravenous  Once 03/16/24 1719 03/16/24 1809       Assessment/Plan: POD 9 s/p dx lap with incidental salpingectomy and drainage of intra-abdominal abscess, Dr. Dasie for acute perforated appendicitis - path with fallopian tube and salpingitis with edema - IR drain 1/27 for pelvic abscess, other abscesses should be amenable to abx therapy.  -picc / tpn due to ileus - Continue IV abx - npo/ngt -mobilize, pulm toilet - WBC continues to improve -mobilize -will try to clamp ng tube   FEN - npo/IVFs/NGT/TNA VTE - Lovenox , SCDs ID - Zosyn  [facility] 04/02/2024

## 2024-04-02 NOTE — Progress Notes (Signed)
 PHARMACY - TOTAL PARENTERAL NUTRITION CONSULT NOTE   Indication: Small bowel obstruction  Patient Measurements: Height: 5' 4 (162.6 cm) Weight: 82.7 kg (182 lb 5.1 oz) IBW/kg (Calculated) : 54.7 TPN AdjBW (KG): 62.3 Body mass index is 31.3 kg/m. Usual Weight: ~85 kg  Assessment:  49 YOF with a chief complaint of abd pain, N/V admitted w/ a perforated appendicitis s/p laparoscopic appendectomy, intra-abdominal abscess w/ drain placement 03/24/24. Postop imaging shows continued SBO. Patient with limited oral intake x9 days. Significant med PTA include Ozempic  [last dose 03/08/2024].  Patient has been on D5 fluids receiving ~120g dextrose /d for 5.5 days, which greatly decreases refeeding risk. Pharmacy consulted for TPN.   Glucose / Insulin : BG < 150, used 8 units SSI/24hrs Hx of T2DM w/ HgbA1C of 6.1 on 03/23/2024.   Electrolytes: CoCa 9.4, others wnl  Renal: Scr 0.71, BUN wnl Hepatic: alk phos/AST/AL/ Tbili wnl, Alb 3.1, TG 168 Intake / Output; MIVF:  UOP x7 charted, NGT 150 mL, drains 15 mL, LBM 1/28 x3 (types 5 &6 medium to large) GI Imaging: 1/12 CT- perforated appendicitis, multiple pockets of gas, no abscess 1/15 CT- perforated appendicitis with enlargement of fluid/gas collection, developing SBO 1/22 KUB- Multiple dilated gas-filled small bowel segments concerning for SBO 1/26 CT abd- 3+ abscess ileocolic mesentery and perirectal region, SBO in duodenum and proximal jejunum  GI Surgeries / Procedures: 1/20- lap appendectomy, lysis of adhesions, drain placed for abscess  1/27 CT guided pelvic abscess drain -15 ml out  Central access: 1/27 TPN start date: 1/27  Nutritional Goals: Goal TPN rate is 85 mL/hr (provides 112 g of protein and 2107 kcals per day)  RD Assessment: Estimated Needs Total Energy Estimated Needs: 2100-2300 Total Protein Estimated Needs: 105-125 grams Total Fluid Estimated Needs: >/= 2 L  Current Nutrition:  NPO and TPN  Plan:  Continue goal TPN 85  mL/hr at 1800, provides 100% estimated needs Electrolytes in TPN: increase Na 125 mEq/L, decrease K 40 mEq/L, Ca 2 mEq/L, decrease Mg 5 mEq/L, increase Phos 20 mmol/L. Cl:Ac 1:1 Add standard MVI and trace elements to TPN Stop Moderate q6h SSI   Monitor TPN labs Mon/Thurs, and PRN  Jinnie Door, PharmD, BCPS, [facility] Clinical Pharmacist  Please check AMION for all [facility] Pharmacy phone numbers After 10:00 PM, call Main Pharmacy [phone]

## 2024-04-02 NOTE — Progress Notes (Signed)
 Patient ID: Andrea Pittman, female   DOB: [birth year], 69 y.o.   MRN: [id number]    Referring Physician(s): Dr. Donnice Bury   Supervising Physician: Jennefer Rover  Patient Status:  [facility] - In-pt  Chief Complaint:  Pelvic abscess s/p TG drain placement 03/31/24  Subjective:  Pt feeling better, no abdominal pain. Does have left face discomfort due to NG tube. States she has been up walking around one time with no issues. X3 Bms in last day.  Allergies: Patient has no known allergies.  Medications: Prior to Admission medications  Medication Sig Start Date End Date Taking? Authorizing Provider  acetaminophen  (TYLENOL ) 500 MG tablet Take 2 tablets (1,000 mg total) by mouth every 6 (six) hours as needed. Patient taking differently: Take 500 mg by mouth every 6 (six) hours as needed for mild pain (pain score 1-3). 09/23/20  Yes Tammy Sor, PA-C  Cholecalciferol (VITAMIN D) 50 MCG (2000 UT) CAPS Take by mouth.   Yes [provider]  Cyanocobalamin (B-12) 100 MCG TABS Take 1 tablet by mouth daily.   Yes [provider]  DULoxetine  (CYMBALTA ) 60 MG capsule Take 2 capsules (120 mg total) by mouth daily. Patient taking differently: Take 60 mg by mouth daily. 10/17/23  Yes Dettinger, Fonda LABOR, MD  estradiol  (ESTRACE ) 0.5 MG tablet Take 0.5 mg by mouth daily. 01/16/24  Yes [provider]  ibuprofen (ADVIL) 200 MG tablet Take 400-600 mg by mouth every 6 (six) hours as needed for fever, headache or mild pain.   Yes [provider]  omega-3 acid ethyl esters (LOVAZA) 1 g capsule Take 1 g by mouth daily.   Yes [provider]  omeprazole  (PRILOSEC) 20 MG capsule Take 1 capsule (20 mg total) by mouth daily. 10/17/23  Yes Dettinger, Fonda LABOR, MD  pregabalin  (LYRICA ) 100 MG capsule Take 1 capsule (100 mg total) by mouth 2 (two) times daily. 09/10/23  Yes Patel, Donika K, DO  Semaglutide ,0.25 or 0.5MG /DOS, 2 MG/3ML SOPN Inject 0.25 mg into the skin once a  week. 10/17/23  Yes Dettinger, Fonda LABOR, MD  estradiol  (ESTRACE ) 2 MG tablet Take 1 mg by mouth daily. Patient not taking: Reported on 03/16/2024 08/03/20   [provider]  ondansetron  (ZOFRAN -ODT) 8 MG disintegrating tablet 8mg  ODT q4 hours prn nausea Patient not taking: Reported on 03/16/2024 03/13/23   [name], MD     Vital Signs: BP (!) 140/73 (BP Location: Left Arm)   Pulse 92   Temp 98 F (36.7 C)   Resp 19   Ht 5' 4 (1.626 m)   Wt 182 lb 5.1 oz (82.7 kg)   SpO2 95%   BMI 31.30 kg/m   Physical Exam Vitals and nursing note reviewed.  Cardiovascular:     Rate and Rhythm: Normal rate.  Pulmonary:     Effort: Pulmonary effort is normal.  Abdominal:     Palpations: Abdomen is soft.     Tenderness: There is no abdominal tenderness.     Comments: + R TG drain. ~5cc of tan output in suction bulb. Dressed appropriately with no overlying abnormality. F/A easily.  Skin:    General: Skin is warm and dry.  Neurological:     Mental Status: She is alert and oriented to person, place, and time. Mental status is at baseline.     Imaging: CT GUIDED PERITONEAL/RETROPERITONEAL FLUID DRAIN BY [facility] CATH Result Date: 03/31/2024 INDICATION: Postop small pelvic abscess EXAM: CT DRAINAGE OF THE POSTERIOR PELVIC ABSCESS (RIGHT TRANS  GLUTEAL APPROACH) MEDICATIONS: The patient is currently admitted to the hospital and receiving intravenous antibiotics. The antibiotics were administered within an appropriate time frame prior to the initiation of the procedure. ANESTHESIA/SEDATION: Moderate (conscious) sedation was employed during this procedure. A total of Versed  1.0 mg and Fentanyl  50 mcg was administered intravenously by the radiology nurse. Total intra-service moderate Sedation Time: 10 minutes. The patient's level of consciousness and vital signs were monitored continuously by radiology nursing throughout the procedure under my direct supervision. COMPLICATIONS: None immediate.  PROCEDURE: Informed written consent was obtained from the patient after a thorough discussion of the procedural risks, benefits and alternatives. All questions were addressed. Maximal Sterile Barrier Technique was utilized including caps, mask, sterile gowns, sterile gloves, sterile drape, hand hygiene and skin antiseptic. A timeout was performed prior to the initiation of the procedure. Patient positioned prone. Noncontrast localization CT performed. The cul-de-sac fluid collection was localized and marked for a right trans gluteal approach. Under sterile conditions and local anesthesia, CT guidance was utilized to advance the 18 gauge 15 cm access needle into the fluid collection. Needle position confirmed with CT. Syringe aspiration yielded purulent fluid. Sample sent for culture. Guidewire inserted followed by tract dilatation to insert a 10 French drain. Drain catheter position confirmed with CT. Catheter secured with a silk suture and a sterile dressing. No immediate complication. Patient tolerated the procedure well. IMPRESSION: Successful CT-guided pelvic abscess drain via right trans gluteal approach. Electronically Signed   By: CHRISTELLA.  Shick M.D.   On: 03/31/2024 10:15   DG Abd Portable 1V Result Date: 03/30/2024 EXAM: 1 VIEW XRAY OF THE ABDOMEN 03/30/2024 06:31:00 PM COMPARISON: 03/26/2024 CLINICAL HISTORY: Encounter for feeding tube placement. ICD10: 738535 Encounter for feeding tube placement. FINDINGS: LINES, TUBES AND DEVICES: Enteric tube in place with tip and side port projecting over the stomach. SOFT TISSUES: Cholecystectomy clips noted. No abnormal calcifications. BONES: No acute fracture. IMPRESSION: 1. Enteric tube in place with tip and side port projecting over the stomach. Electronically signed by: Oneil Devonshire MD 03/30/2024 06:44 PM EST RP Workstation: [id number]   US  EKG SITE RITE Result Date: 03/30/2024 If Site Rite image not attached, placement could not be confirmed due to current  cardiac rhythm.  CT ABDOMEN PELVIS W CONTRAST Result Date: 03/30/2024 CLINICAL DATA:  Postop abdominal pain. EXAM: CT ABDOMEN AND PELVIS WITH CONTRAST TECHNIQUE: Multidetector CT imaging of the abdomen and pelvis was performed using the standard protocol following bolus administration of intravenous contrast. RADIATION DOSE REDUCTION: This exam was performed according to the departmental dose-optimization program which includes automated exposure control, adjustment of the mA and/or kV according to patient size and/or use of iterative reconstruction technique. CONTRAST:  65mL OMNIPAQUE  IOHEXOL  350 MG/ML SOLN COMPARISON:  03/19/2024. FINDINGS: Lower chest: Minimal dependent atelectasis bilaterally. Heart is at the upper limits of normal in size to mildly enlarged. No pericardial or pleural effusion. Distal esophagus is grossly unremarkable. Hepatobiliary: Liver is slightly decreased in attenuation diffusely. Pneumobilia, as before. Cholecystectomy. No unexpected biliary ductal dilatation. Pancreas: Negative. Spleen: Negative. Adrenals/Urinary Tract: Adrenal glands are unremarkable. Subcentimeter low-attenuation lesions in the kidneys. No specific follow-up necessary. Ureters are decompressed. Bladder is unremarkable. Stomach/Bowel: Stomach is distended with fluid. Marked dilatation of the duodenum and proximal jejunum with a transition point to normal caliber small bowel in the right mid abdomen (3/48). Appendectomy 03/24/2024 with an organized fluid collection in the ileocolic mesentery, measuring 1.7 x 5.0 cm (3/61). Percutaneous drain terminates slightly lateral to this fluid collection. A second  fluid collection is seen somewhat superomedially, measuring 2.4 x 2.5 cm (3/54). Appendectomy. Colon is otherwise unremarkable. Vascular/Lymphatic: Atherosclerotic calcification of the aorta. No pathologically enlarged lymph nodes. Reproductive: No adnexal mass. Other: Small bilateral inguinal hernias contain fat.  Organized collection of perirectal fluid and air measures 3.3 x 4.9 cm (3/68). Adjacent peritoneal soft tissue thickening. Musculoskeletal: Degenerative changes in the spine. Bone island in the left pubic bone. IMPRESSION: 1. Interval appendectomy with at least 3 abscesses in the ileocolic mesentery and perirectal region. Percutaneous drain in the right anatomic pelvis. 2. Duodenum and proximal jejunum are markedly dilated and fluid-filled with a transition point in the right mid abdomen, indicative of a small bowel obstruction. 3. Hepatic steatosis. 4.  Aortic atherosclerosis (ICD10-I70.0). Electronically Signed   By: Newell Eke M.D.   On: 03/30/2024 14:22    Labs:  CBC: Recent Labs    03/28/24 0528 03/30/24 0215 04/01/24 0053 04/02/24 0400  WBC 21.2* 20.1* 18.5* 13.6*  HGB 11.5* 12.2 10.7* 10.5*  HCT 35.7* 37.6 33.3* 32.8*  PLT 624* 719* 620* 589*    COAGS: Recent Labs    03/20/24 0508  INR 1.0    BMP: Recent Labs    03/30/24 0215 03/31/24 0449 04/01/24 0053 04/02/24 0400  NA 139 137 136 137  K 4.2 4.8 4.0 4.2  CL 101 100 99 102  CO2 26 28 27 27   GLUCOSE 136* 118* 127* 140*  BUN <5* 5* 6* 12  CALCIUM 9.4 9.2 8.5* 8.7*  CREATININE 0.84 0.93 0.71 0.71  GFRNONAA >60 >60 >60 >60    LIVER FUNCTION TESTS: Recent Labs    01/24/24 0949 03/16/24 1513 03/31/24 0449 04/01/24 0053 04/02/24 0400  BILITOT 0.6 1.3* 0.5  --  0.4  AST 22 18 29   --  22  ALT 26 14 19   --  21  ALKPHOS 87 106 92  --  132*  PROT 7.3 7.8 7.1  --  7.3  ALBUMIN 3.9 3.6 2.9* 3.0* 3.1*    Assessment and Plan:  Drain Location: R TG Size: Fr size: 10 Fr Date of placement: 03/31/24  Currently to: Drain collection device: suction bulb 24 hour output:  Output by Drain (mL) 03/31/24 0701 - 03/31/24 1900 03/31/24 1901 - 04/01/24 0700 04/01/24 0701 - 04/01/24 1900 04/01/24 1901 - 04/02/24 0700 04/02/24 0701 - 04/02/24 1529  Closed System Drain 1 Left;Lateral Abdomen Bulb (JP) 19 Fr. 20 5   12.5   Closed System Drain 1 Inferior;Right Back Bulb (JP) 10 Fr. 5 5   5     Interval imaging/drain manipulation:  None  Current examination: Flushes/aspirates easily.  Insertion site unremarkable. Suture and stat lock in place. Dressed appropriately.   Plan: Continue TID flushes with 5 cc NS. Record output Q shift. Dressing changes QD or PRN if soiled.  Call IR APP or on call IR MD if difficulty flushing or sudden change in drain output.  Repeat imaging/possible drain injection once output < 10 mL/QD (excluding flush material). Consideration for drain removal if output is < 10 mL/QD (excluding flush material), pending discussion with the providing surgical service.  Discharge planning: Please contact IR APP or on call IR MD prior to patient d/c to ensure appropriate follow up plans are in place. Typically patient will follow up with IR clinic 10-14 days post d/c for repeat imaging/possible drain injection. IR scheduler will contact patient with date/time of appointment. Patient will need to flush drain QD with 5 cc NS, record output QD, dressing changes  every 2-3 days or earlier if soiled.   IR will continue to follow - please call with questions or concerns.     Electronically Signed: Kimble VEAR Clas, PA-C 04/02/2024, 3:26 PM   I spent a total of 15 Minutes at the the patient's bedside AND on the patient's hospital floor or unit, greater than 50% of which was counseling/coordinating care for right transgluteal drain follow up.

## 2024-04-03 LAB — RENAL FUNCTION PANEL
Albumin: 3.1 g/dL — ABNORMAL LOW (ref 3.5–5.0)
Albumin: 3 g/dL — ABNORMAL LOW (ref 3.5–5.0)
Anion gap: 11 (ref 5–15)
Anion gap: 9 (ref 5–15)
BUN: 16 mg/dL (ref 8–23)
BUN: 17 mg/dL (ref 8–23)
CO2: 27 mmol/L (ref 22–32)
CO2: 27 mmol/L (ref 22–32)
Calcium: 8.6 mg/dL — ABNORMAL LOW (ref 8.9–10.3)
Calcium: 8.6 mg/dL — ABNORMAL LOW (ref 8.9–10.3)
Chloride: 100 mmol/L (ref 98–111)
Chloride: 102 mmol/L (ref 98–111)
Creatinine, Ser: 0.74 mg/dL (ref 0.44–1.00)
Creatinine, Ser: 0.73 mg/dL (ref 0.44–1.00)
GFR, Estimated: >60 mL/min
GFR, Estimated: >60 mL/min
Glucose, Bld: 155 mg/dL — ABNORMAL HIGH (ref 70–99)
Glucose, Bld: 151 mg/dL — ABNORMAL HIGH (ref 70–99)
Phosphorus: 3.2 mg/dL (ref 2.5–4.6)
Phosphorus: 3.8 mg/dL (ref 2.5–4.6)
Potassium: 4.1 mmol/L (ref 3.5–5.1)
Potassium: 4.4 mmol/L (ref 3.5–5.1)
Sodium: 137 mmol/L (ref 135–145)
Sodium: 138 mmol/L (ref 135–145)

## 2024-04-03 LAB — GLUCOSE, CAPILLARY
Glucose-Capillary: 163 mg/dL — ABNORMAL HIGH (ref 70–99)
Glucose-Capillary: 155 mg/dL — ABNORMAL HIGH (ref 70–99)

## 2024-04-03 LAB — MAGNESIUM: Magnesium: 2 mg/dL (ref 1.7–2.4)

## 2024-04-03 LAB — AEROBIC/ANAEROBIC CULTURE W GRAM STAIN (SURGICAL/DEEP WOUND)

## 2024-04-03 MED ORDER — TRAVASOL 10 % IV SOLN
INTRAVENOUS | Status: AC
  Filled 2024-04-03: qty 1122

## 2024-04-03 NOTE — Progress Notes (Signed)
 PHARMACY - TOTAL PARENTERAL NUTRITION CONSULT NOTE   Indication: Small bowel obstruction  Patient Measurements: Height: 5' 4 (162.6 cm) Weight: 82.8 kg (182 lb 8.7 oz) IBW/kg (Calculated) : 54.7 TPN AdjBW (KG): 62.3 Body mass index is 31.33 kg/m. Usual Weight: ~85 kg  Assessment:  30 YOF with a chief complaint of abd pain, N/V admitted w/ a perforated appendicitis s/p laparoscopic appendectomy, intra-abdominal abscess w/ drain placement 03/24/24. Postop imaging shows continued SBO. Patient with limited oral intake x9 days. Significant med PTA include Ozempic  [last dose 03/08/2024].  Patient had been on D5 fluids receiving ~120g dextrose /d for 5.5 days, which greatly decreased refeeding risk. Pharmacy consulted for TPN.   1/30: NGT clamp trial 1/29 >>out, with 3x stool 1/28 and continues to have flatus. Starting clears today.   Glucose / Insulin : BG < 180 off sliding scale insulin .  Hx of T2DM w/ HgbA1C of 6.1 on 03/23/2024.   Electrolytes: CoCa 9.3, others wnl  Renal: Scr 0.74, BUN 16 (up from < 5)  Hepatic: alk phos/AST/AL/ Tbili wnl, Alb 3.1, TG 168 1/26  Intake / Output; MIVF:  UOP not charted 1/29, NGT 50 mL, drains 20.5 mL, LBM 1/28 x3 (types 5 &6 medium to large) GI Imaging: 1/12 CT- perforated appendicitis, multiple pockets of gas, no abscess 1/15 CT- perforated appendicitis with enlargement of fluid/gas collection, developing SBO 1/22 KUB- Multiple dilated gas-filled small bowel segments concerning for SBO 1/26 CT abd- 3+ abscess ileocolic mesentery and perirectal region, SBO in duodenum and proximal jejunum  GI Surgeries / Procedures: 1/20- lap appendectomy, lysis of adhesions, drain placed for abscess  1/27 CT guided pelvic abscess drain -15 ml out  Central access: 1/27 TPN start date: 1/27  Nutritional Goals: Goal TPN rate is 85 mL/hr (provides 112 g of protein and 2107 kcals per day)  RD Assessment: Estimated Needs Total Energy Estimated Needs: 2100-2300 Total  Protein Estimated Needs: 105-125 grams Total Fluid Estimated Needs: >/= 2 L  Current Nutrition:  NPO and TPN  1/30 clear liquid diet + TPN   Plan:  Continue goal TPN 85 mL/hr at 1800, provides 100% estimated needs Electrolytes in TPN: Na 125 mEq/L, K 40 mEq/L, Ca 2 mEq/L, Mg 5 mEq/L, Phos 20 mmol/L. Cl:Ac 1:1 Add standard MVI and trace elements to TPN Continue to observe glucose off SSI, may need to re-introduce with oral diet  Monitor TPN labs Mon/Thurs, and PRN  Rankin Sams, PharmD, BCCCP Clinical Pharmacist

## 2024-04-03 NOTE — Progress Notes (Signed)
 10 Days Post-Op   Subjective/Chief Complaint: Has had flatus, still with bms, ng out tol  well   Objective: Vital signs in last 24 hours: Temp:  [97.8 F (36.6 C)-98.3 F (36.8 C)] 97.8 F (36.6 C) (01/30 0731) Pulse Rate:  [87-92] 89 (01/30 0731) Resp:  [16-19] 19 (01/30 0532) BP: (113-138)/(60-77) 123/77 (01/30 0731) SpO2:  [94 %-98 %] 94 % (01/30 0731) Weight:  [82.8 kg] 82.8 kg (01/30 0500) Last BM Date : 04/02/24  Intake/Output from previous day: 01/29 0701 - 01/30 0700 In: 1043.4 [I.V.:827.2; IV Piggyback:216.3] Out: 70.5 [Emesis/NG output:50; Drains:20.5] Intake/Output this shift: No intake/output data recorded.  General nad Cv regular Pulm effort normal Ab soft approp tender incisions clean drains serous  Lab Results:  Recent Labs    04/01/24 0053 04/02/24 0400  WBC 18.5* 13.6*  HGB 10.7* 10.5*  HCT 33.3* 32.8*  PLT 620* 589*   BMET Recent Labs    04/02/24 0400 04/03/24 0304  NA 137 137  K 4.2 4.1  CL 102 100  CO2 27 27  GLUCOSE 140* 155*  BUN 12 16  CREATININE 0.71 0.74  CALCIUM 8.7* 8.6*   PT/INR No results for input(s): LABPROT, INR in the last 72 hours. ABG No results for input(s): PHART, HCO3 in the last 72 hours.  Invalid input(s): PCO2, PO2  Studies/Results: No results found.  Anti-infectives: Anti-infectives (From admission, onward)    Start     Dose/Rate Route Frequency Ordered Stop   03/27/24 1400  piperacillin -tazobactam (ZOSYN ) IVPB 3.375 g        3.375 g 12.5 mL/hr over 240 Minutes Intravenous Every 8 hours 03/27/24 0955     03/24/24 2200  piperacillin -tazobactam (ZOSYN ) IVPB 3.375 g  Status:  Discontinued        3.375 g 12.5 mL/hr over 240 Minutes Intravenous Every 8 hours 03/24/24 1833 03/27/24 0955   03/24/24 0830  piperacillin -tazobactam (ZOSYN ) IVPB 3.375 g        3.375 g 12.5 mL/hr over 240 Minutes Intravenous On call to O.R. 03/24/24 0823 03/24/24 0855   03/17/24 0200  piperacillin -tazobactam (ZOSYN )  IVPB 3.375 g        3.375 g 12.5 mL/hr over 240 Minutes Intravenous Every 8 hours 03/16/24 1928 03/24/24 0029   03/16/24 1730  piperacillin -tazobactam (ZOSYN ) IVPB 3.375 g        3.375 g 100 mL/hr over 30 Minutes Intravenous  Once 03/16/24 1719 03/16/24 1809       Assessment/Plan: POD 10 s/p dx lap with incidental salpingectomy and drainage of intra-abdominal abscess, Dr. Dasie for acute perforated appendicitis - path with fallopian tube and salpingitis with edema - IR drain 1/27 for pelvic abscess, other abscesses should be amenable to abx therapy.  -will try clears today, continue tpn until tol diet - Continue IV abx -mobilize, pulm toilet - WBC pending -mobilize    FEN - clears/IVFs/NGT/TNA VTE - Lovenox , SCDs ID - Zosyn    Andrea Pittman 04/03/2024

## 2024-04-03 NOTE — Plan of Care (Signed)
" °  Problem: Education: Goal: Knowledge of General Education information will improve Description: Including pain rating scale, medication(s)/side effects and non-pharmacologic comfort measures Outcome: Progressing   Problem: Health Behavior/Discharge Planning: Goal: Ability to manage health-related needs will improve Outcome: Progressing   Problem: Clinical Measurements: Goal: Ability to maintain clinical measurements within normal limits will improve Outcome: Progressing Goal: Will remain free from infection Outcome: Progressing Goal: Diagnostic test results will improve Outcome: Progressing Goal: Respiratory complications will improve Outcome: Progressing Goal: Cardiovascular complication will be avoided Outcome: Progressing   Problem: Activity: Goal: Risk for activity intolerance will decrease Outcome: Progressing   Problem: Pain Managment: Goal: General experience of comfort will improve and/or be controlled Outcome: Progressing   Problem: Elimination: Goal: Will not experience complications related to bowel motility Outcome: Progressing Goal: Will not experience complications related to urinary retention Outcome: Progressing   Problem: Safety: Goal: Ability to remain free from injury will improve Outcome: Progressing   "

## 2024-04-04 MED ORDER — TRAVASOL 10 % IV SOLN
INTRAVENOUS | Status: AC
  Filled 2024-04-04: qty 1122

## 2024-04-04 NOTE — Plan of Care (Signed)

## 2024-04-04 NOTE — Progress Notes (Addendum)
 PHARMACY - TOTAL PARENTERAL NUTRITION CONSULT NOTE   Indication: Small bowel obstruction  Patient Measurements: Height: 5' 4 (162.6 cm) Weight: 84.5 kg (186 lb 4.6 oz) IBW/kg (Calculated) : 54.7 TPN AdjBW (KG): 62.3 Body mass index is 31.98 kg/m. Usual Weight: ~85 kg  Assessment:  25 YOF with a chief complaint of abd pain, N/V admitted w/ a perforated appendicitis s/p laparoscopic appendectomy, intra-abdominal abscess w/ drain placement 03/24/24. Postop imaging shows continued SBO. Patient with limited oral intake x9 days. Significant med PTA include Ozempic  [last dose 03/08/2024].  Patient had been on D5 fluids receiving ~120g dextrose /d for 5.5 days, which greatly decreased refeeding risk. Pharmacy consulted for TPN.   1/30: NGT clamp trial 1/29 >>out, with 3x stool 1/28 and continues to have flatus. Starting clears today.   Glucose / Insulin : BG < 180 off sliding scale insulin .  Hx of T2DM w/ HgbA1C of 6.1 on 03/23/2024.   Electrolytes: last labs 1/30: CoCa 9.3, others wnl  Renal: Scr 0.73, BUN wnl (up from < 5)  Hepatic: alk phos/AST/AL/ Tbili wnl, Alb 3.1, TG 168 1/26  Intake / Output; MIVF:  UOP not charted 1/29, NGT 50 mL, drains 10 mL, LBM 1/28 x3 (types 5 &6 medium to large) GI Imaging: 1/12 CT- perforated appendicitis, multiple pockets of gas, no abscess 1/15 CT- perforated appendicitis with enlargement of fluid/gas collection, developing SBO 1/22 KUB- Multiple dilated gas-filled small bowel segments concerning for SBO 1/26 CT abd- 3+ abscess ileocolic mesentery and perirectal region, SBO in duodenum and proximal jejunum  GI Surgeries / Procedures: 1/20- lap appendectomy, lysis of adhesions, drain placed for abscess  1/27 CT guided pelvic abscess drain -15 ml out  Central access: 1/27 TPN start date: 1/27  Nutritional Goals: Goal TPN rate is 85 mL/hr (provides 112 g of protein and 2107 kcals per day)  RD Assessment: Estimated Needs Total Energy Estimated Needs:  2100-2300 Total Protein Estimated Needs: 105-125 grams Total Fluid Estimated Needs: >/= 2 L  Current Nutrition:  NPO and TPN  1/30 clear liquid diet + TPN   Plan:  Continue goal TPN 85 mL/hr at 1800, provides 100% estimated needs Electrolytes in TPN: Na 125 mEq/L, decrease K 30 mEq/L, Ca 2 mEq/L, increase Mg 7 mEq/L, decrease Phos 10 mmol/L. Cl:Ac 1:1 Add standard MVI and trace elements to TPN Continue to observe glucose off SSI, may need to re-introduce with oral diet  Monitor TPN labs Mon/Thurs, and PRN F/u po intake, wean TPN as able  Jinnie Door, PharmD, BCPS, BCCP Clinical Pharmacist  Please check AMION for all [facility] Pharmacy phone numbers After 10:00 PM, call Main Pharmacy [phone]

## 2024-04-04 NOTE — Progress Notes (Signed)
 11 Days Post-Op   Subjective/Chief Complaint: Tolerated clear liquids Denies nausea this morning   Objective: Vital signs in last 24 hours: Temp:  [97.4 F (36.3 C)-98.6 F (37 C)] 97.4 F (36.3 C) (01/31 0826) Pulse Rate:  [85-95] 85 (01/31 0826) Resp:  [18-19] 18 (01/31 0826) BP: (128-136)/(61-73) 136/69 (01/31 0826) SpO2:  [96 %-98 %] 96 % (01/31 0826) Weight:  [84.5 kg] 84.5 kg (01/31 0500) Last BM Date : 04/03/24  Intake/Output from previous day: 01/30 0701 - 01/31 0700 In: 1168.6 [I.V.:1008.1; IV Piggyback:155.5] Out: 10 [Drains:10] Intake/Output this shift: No intake/output data recorded.  Exam: Awake and alert Abdomen soft, non-distended Drain serous  Lab Results:  Recent Labs    04/02/24 0400  WBC 13.6*  HGB 10.5*  HCT 32.8*  PLT 589*   BMET Recent Labs    04/03/24 0304 04/03/24 0500  NA 137 138  K 4.1 4.4  CL 100 102  CO2 27 27  GLUCOSE 155* 151*  BUN 16 17  CREATININE 0.74 0.73  CALCIUM 8.6* 8.6*   PT/INR No results for input(s): LABPROT, INR in the last 72 hours. ABG No results for input(s): PHART, HCO3 in the last 72 hours.  Invalid input(s): PCO2, PO2  Studies/Results: No results found.  Anti-infectives: Anti-infectives (From admission, onward)    Start     Dose/Rate Route Frequency Ordered Stop   03/27/24 1400  piperacillin -tazobactam (ZOSYN ) IVPB 3.375 g        3.375 g 12.5 mL/hr over 240 Minutes Intravenous Every 8 hours 03/27/24 0955     03/24/24 2200  piperacillin -tazobactam (ZOSYN ) IVPB 3.375 g  Status:  Discontinued        3.375 g 12.5 mL/hr over 240 Minutes Intravenous Every 8 hours 03/24/24 1833 03/27/24 0955   03/24/24 0830  piperacillin -tazobactam (ZOSYN ) IVPB 3.375 g        3.375 g 12.5 mL/hr over 240 Minutes Intravenous On call to O.R. 03/24/24 0823 03/24/24 0855   03/17/24 0200  piperacillin -tazobactam (ZOSYN ) IVPB 3.375 g        3.375 g 12.5 mL/hr over 240 Minutes Intravenous Every 8 hours  03/16/24 1928 03/24/24 0029   03/16/24 1730  piperacillin -tazobactam (ZOSYN ) IVPB 3.375 g        3.375 g 100 mL/hr over 30 Minutes Intravenous  Once 03/16/24 1719 03/16/24 1809       Assessment/Plan: POD 11 s/p dx lap with incidental salpingectomy and drainage of intra-abdominal abscess, Dr. Dasie for acute perforated appendiciti    -advance to full liquid diet -wean TPN off if fulls tolerated -continue antibiotics -repeat CBC tomorrow  Vicenta Poli 04/04/2024

## 2024-04-04 NOTE — Plan of Care (Signed)
" °  Problem: Education: Goal: Knowledge of General Education information will improve Description: Including pain rating scale, medication(s)/side effects and non-pharmacologic comfort measures Outcome: Progressing   Problem: Health Behavior/Discharge Planning: Goal: Ability to manage health-related needs will improve Outcome: Progressing   Problem: Clinical Measurements: Goal: Ability to maintain clinical measurements within normal limits will improve Outcome: Progressing Goal: Will remain free from infection Outcome: Progressing Goal: Diagnostic test results will improve Outcome: Progressing Goal: Respiratory complications will improve Outcome: Progressing Goal: Cardiovascular complication will be avoided Outcome: Progressing   Problem: Activity: Goal: Risk for activity intolerance will decrease Outcome: Progressing   Problem: Nutrition: Goal: Adequate nutrition will be maintained Outcome: Progressing   Problem: Coping: Goal: Level of anxiety will decrease Outcome: Progressing   Problem: Elimination: Goal: Will not experience complications related to bowel motility Outcome: Progressing Goal: Will not experience complications related to urinary retention Outcome: Progressing   Problem: Pain Managment: Goal: General experience of comfort will improve and/or be controlled Outcome: Progressing   Problem: Safety: Goal: Ability to remain free from injury will improve Outcome: Progressing   Problem: Skin Integrity: Goal: Risk for impaired skin integrity will decrease Outcome: Progressing   Problem: Education: Goal: Ability to describe self-care measures that may prevent or decrease complications (Diabetes Survival Skills Education) will improve Outcome: Progressing Goal: Individualized Educational Video(s) Outcome: Progressing   Problem: Coping: Goal: Ability to adjust to condition or change in health will improve Outcome: Progressing   Problem: Fluid  Volume: Goal: Ability to maintain a balanced intake and output will improve Outcome: Progressing   Problem: Health Behavior/Discharge Planning: Goal: Ability to identify and utilize available resources and services will improve Outcome: Progressing Goal: Ability to manage health-related needs will improve Outcome: Progressing   Problem: Metabolic: Goal: Ability to maintain appropriate glucose levels will improve Outcome: Progressing   Problem: Nutritional: Goal: Maintenance of adequate nutrition will improve Outcome: Progressing Goal: Progress toward achieving an optimal weight will improve Outcome: Progressing   Problem: Skin Integrity: Goal: Risk for impaired skin integrity will decrease Outcome: Progressing   Problem: Tissue Perfusion: Goal: Adequacy of tissue perfusion will improve Outcome: Progressing   Problem: Education: Goal: Knowledge of the prescribed therapeutic regimen will improve Outcome: Progressing   Problem: Bowel/Gastric: Goal: Gastrointestinal status for postoperative course will improve Outcome: Progressing   Problem: Cardiac: Goal: Ability to maintain an adequate cardiac output Outcome: Progressing Goal: Will show no evidence of cardiac arrhythmias Outcome: Progressing   Problem: Nutritional: Goal: Will attain and maintain optimal nutritional status Outcome: Progressing   Problem: Neurological: Goal: Will regain or maintain usual level of consciousness Outcome: Progressing   Problem: Clinical Measurements: Goal: Ability to maintain clinical measurements within normal limits Outcome: Progressing Goal: Postoperative complications will be avoided or minimized Outcome: Progressing   Problem: Respiratory: Goal: Will regain and/or maintain adequate ventilation Outcome: Progressing Goal: Respiratory status will improve Outcome: Progressing   Problem: Skin Integrity: Goal: Demonstrates signs of wound healing without infection Outcome:  Progressing   Problem: Urinary Elimination: Goal: Will remain free from infection Outcome: Progressing Goal: Ability to achieve and maintain adequate urine output Outcome: Progressing   "

## 2024-04-05 LAB — RENAL FUNCTION PANEL
Albumin: 3.2 g/dL — ABNORMAL LOW (ref 3.5–5.0)
Anion gap: 9 (ref 5–15)
BUN: 15 mg/dL (ref 8–23)
CO2: 28 mmol/L (ref 22–32)
Calcium: 8.7 mg/dL — ABNORMAL LOW (ref 8.9–10.3)
Chloride: 104 mmol/L (ref 98–111)
Creatinine, Ser: 0.7 mg/dL (ref 0.44–1.00)
GFR, Estimated: >60 mL/min
Glucose, Bld: 159 mg/dL — ABNORMAL HIGH (ref 70–99)
Phosphorus: 2.8 mg/dL (ref 2.5–4.6)
Potassium: 3.8 mmol/L (ref 3.5–5.1)
Sodium: 140 mmol/L (ref 135–145)

## 2024-04-05 LAB — CBC
HCT: 34 % — ABNORMAL LOW (ref 36.0–46.0)
Hemoglobin: 10.9 g/dL — ABNORMAL LOW (ref 12.0–15.0)
MCH: 29.5 pg (ref 26.0–34.0)
MCHC: 32.1 g/dL (ref 30.0–36.0)
MCV: 92.1 fL (ref 80.0–100.0)
Platelets: 535 10*3/uL — ABNORMAL HIGH (ref 150–400)
RBC: 3.69 MIL/uL — ABNORMAL LOW (ref 3.87–5.11)
RDW: 13.3 % (ref 11.5–15.5)
WBC: 10.1 10*3/uL (ref 4.0–10.5)
nRBC: 0 % (ref 0.0–0.2)

## 2024-04-05 LAB — MAGNESIUM: Magnesium: 1.9 mg/dL (ref 1.7–2.4)

## 2024-04-05 LAB — GLUCOSE, CAPILLARY: Glucose-Capillary: 166 mg/dL — ABNORMAL HIGH (ref 70–99)

## 2024-04-05 MED ORDER — ENSURE PLUS HIGH PROTEIN PO LIQD
237.0000 mL | Freq: Three times a day (TID) | ORAL | Status: DC
  Administered 2024-04-05 – 2024-04-06 (×5): 237 mL via ORAL
  Filled 2024-04-05: qty 237

## 2024-04-05 MED ORDER — ADULT MULTIVITAMIN W/MINERALS CH
1.0000 | ORAL_TABLET | Freq: Every day | ORAL | Status: DC
  Administered 2024-04-06 – 2024-04-07 (×2): 1 via ORAL
  Filled 2024-04-05 (×2): qty 1

## 2024-04-05 MED ORDER — METHOCARBAMOL 500 MG PO TABS
500.0000 mg | ORAL_TABLET | Freq: Three times a day (TID) | ORAL | Status: DC
  Administered 2024-04-05 – 2024-04-07 (×6): 500 mg via ORAL
  Filled 2024-04-05 (×6): qty 1

## 2024-04-05 MED ORDER — ENSURE PLUS HIGH PROTEIN PO LIQD: 237.0000 mL | Freq: Two times a day (BID) | ORAL | Status: DC

## 2024-04-05 MED ORDER — PROSOURCE PLUS PO LIQD
30.0000 mL | Freq: Two times a day (BID) | ORAL | Status: DC
  Administered 2024-04-05 – 2024-04-07 (×5): 30 mL via ORAL
  Filled 2024-04-05 (×5): qty 30

## 2024-04-05 MED ORDER — PANTOPRAZOLE SODIUM 40 MG PO TBEC
40.0000 mg | DELAYED_RELEASE_TABLET | Freq: Every day | ORAL | Status: DC
  Administered 2024-04-05 – 2024-04-07 (×3): 40 mg via ORAL
  Filled 2024-04-05 (×3): qty 1

## 2024-04-05 NOTE — Plan of Care (Signed)
" °  Problem: Education: Goal: Knowledge of General Education information will improve Description: Including pain rating scale, medication(s)/side effects and non-pharmacologic comfort measures Outcome: Progressing   Problem: Health Behavior/Discharge Planning: Goal: Ability to manage health-related needs will improve Outcome: Progressing   Problem: Clinical Measurements: Goal: Ability to maintain clinical measurements within normal limits will improve Outcome: Progressing Goal: Will remain free from infection Outcome: Progressing Goal: Diagnostic test results will improve Outcome: Progressing Goal: Respiratory complications will improve Outcome: Progressing Goal: Cardiovascular complication will be avoided Outcome: Progressing   Problem: Activity: Goal: Risk for activity intolerance will decrease Outcome: Progressing   Problem: Nutrition: Goal: Adequate nutrition will be maintained Outcome: Progressing   Problem: Coping: Goal: Level of anxiety will decrease Outcome: Progressing   Problem: Elimination: Goal: Will not experience complications related to bowel motility Outcome: Progressing Goal: Will not experience complications related to urinary retention Outcome: Progressing   Problem: Pain Managment: Goal: General experience of comfort will improve and/or be controlled Outcome: Progressing   Problem: Safety: Goal: Ability to remain free from injury will improve Outcome: Progressing   Problem: Skin Integrity: Goal: Risk for impaired skin integrity will decrease Outcome: Progressing   Problem: Education: Goal: Ability to describe self-care measures that may prevent or decrease complications (Diabetes Survival Skills Education) will improve Outcome: Progressing Goal: Individualized Educational Video(s) Outcome: Progressing   Problem: Coping: Goal: Ability to adjust to condition or change in health will improve Outcome: Progressing   Problem: Fluid  Volume: Goal: Ability to maintain a balanced intake and output will improve Outcome: Progressing   Problem: Health Behavior/Discharge Planning: Goal: Ability to identify and utilize available resources and services will improve Outcome: Progressing Goal: Ability to manage health-related needs will improve Outcome: Progressing   Problem: Metabolic: Goal: Ability to maintain appropriate glucose levels will improve Outcome: Progressing   Problem: Nutritional: Goal: Maintenance of adequate nutrition will improve Outcome: Progressing Goal: Progress toward achieving an optimal weight will improve Outcome: Progressing   Problem: Skin Integrity: Goal: Risk for impaired skin integrity will decrease Outcome: Progressing   Problem: Tissue Perfusion: Goal: Adequacy of tissue perfusion will improve Outcome: Progressing   Problem: Education: Goal: Knowledge of the prescribed therapeutic regimen will improve Outcome: Progressing   Problem: Bowel/Gastric: Goal: Gastrointestinal status for postoperative course will improve Outcome: Progressing   Problem: Cardiac: Goal: Ability to maintain an adequate cardiac output Outcome: Progressing Goal: Will show no evidence of cardiac arrhythmias Outcome: Progressing   Problem: Nutritional: Goal: Will attain and maintain optimal nutritional status Outcome: Progressing   Problem: Neurological: Goal: Will regain or maintain usual level of consciousness Outcome: Progressing   Problem: Clinical Measurements: Goal: Ability to maintain clinical measurements within normal limits Outcome: Progressing Goal: Postoperative complications will be avoided or minimized Outcome: Progressing   Problem: Respiratory: Goal: Will regain and/or maintain adequate ventilation Outcome: Progressing Goal: Respiratory status will improve Outcome: Progressing   Problem: Skin Integrity: Goal: Demonstrates signs of wound healing without infection Outcome:  Progressing   Problem: Urinary Elimination: Goal: Will remain free from infection Outcome: Progressing Goal: Ability to achieve and maintain adequate urine output Outcome: Progressing   "

## 2024-04-05 NOTE — Plan of Care (Signed)
" °  Problem: Education: Goal: Knowledge of General Education information will improve Description: Including pain rating scale, medication(s)/side effects and non-pharmacologic comfort measures 04/05/2024 0113 by Lowella Sewer, RN Outcome: Progressing 04/05/2024 0112 by Lowella Sewer, RN Outcome: Progressing   Problem: Activity: Goal: Risk for activity intolerance will decrease 04/05/2024 0113 by Lowella Sewer, RN Outcome: Progressing 04/05/2024 0112 by Lowella Sewer, RN Outcome: Progressing   Problem: Nutrition: Goal: Adequate nutrition will be maintained 04/05/2024 0113 by Lowella Sewer, RN Outcome: Progressing 04/05/2024 0112 by Lowella Sewer, RN Outcome: Progressing   Problem: Coping: Goal: Level of anxiety will decrease 04/05/2024 0113 by Lowella Sewer, RN Outcome: Progressing 04/05/2024 0112 by Lowella Sewer, RN Outcome: Progressing   Problem: Elimination: Goal: Will not experience complications related to bowel motility 04/05/2024 0113 by Lowella Sewer, RN Outcome: Progressing 04/05/2024 0112 by Lowella Sewer, RN Outcome: Progressing Goal: Will not experience complications related to urinary retention 04/05/2024 0113 by Lowella Sewer, RN Outcome: Progressing 04/05/2024 0112 by Lowella Sewer, RN Outcome: Progressing   Problem: Pain Managment: Goal: General experience of comfort will improve and/or be controlled 04/05/2024 0113 by Lowella Sewer, RN Outcome: Progressing 04/05/2024 0112 by Lowella Sewer, RN Outcome: Progressing   Problem: Safety: Goal: Ability to remain free from injury will improve 04/05/2024 0113 by Lowella Sewer, RN Outcome: Progressing 04/05/2024 0112 by Lowella Sewer, RN Outcome: Progressing   Problem: Skin Integrity: Goal: Risk for impaired skin integrity will decrease 04/05/2024 0113 by Lowella Sewer, RN Outcome: Progressing 04/05/2024 0112 by Lowella Sewer, RN Outcome: Progressing   "

## 2024-04-05 NOTE — Progress Notes (Addendum)
 PHARMACY - TOTAL PARENTERAL NUTRITION CONSULT NOTE   Indication: Small bowel obstruction  Patient Measurements: Height: 5' 4 (162.6 cm) Weight: 84.4 kg (186 lb 1.1 oz) IBW/kg (Calculated) : 54.7 TPN AdjBW (KG): 62.3 Body mass index is 31.94 kg/m. Usual Weight: ~85 kg  Assessment:  40 YOF with a chief complaint of abd pain, N/V admitted w/ a perforated appendicitis s/p laparoscopic appendectomy, intra-abdominal abscess w/ drain placement 03/24/24. Postop imaging shows continued SBO. Patient with limited oral intake x9 days. Significant med PTA include Ozempic  [last dose 03/08/2024].  Patient had been on D5 fluids receiving ~120g dextrose /d for 5.5 days, which greatly decreased refeeding risk. Pharmacy consulted for TPN.   1/30: NGT clamp trial 1/29 >>out, with 3x stool 1/28 and continues to have flatus. Starting clears today.   Glucose / Insulin : BG < 180 off sliding scale insulin .  Hx of T2DM w/ HgbA1C of 6.1 on 03/23/2024.   Electrolytes: last labs 1/30: CoCa 9.3, others wnl  Renal: Scr 0.73, BUN wnl (up from < 5)  Hepatic: alk phos/AST/AL/ Tbili wnl, Alb 3.1, TG 168 1/26  Intake / Output; MIVF:  UOP not charted 1/29, NGT 0ml charted, drains 15 mL, LBM 1/28 x3 (types 5 &6 medium to large) GI Imaging: 1/12 CT- perforated appendicitis, multiple pockets of gas, no abscess 1/15 CT- perforated appendicitis with enlargement of fluid/gas collection, developing SBO 1/22 KUB- Multiple dilated gas-filled small bowel segments concerning for SBO 1/26 CT abd- 3+ abscess ileocolic mesentery and perirectal region, SBO in duodenum and proximal jejunum  GI Surgeries / Procedures: 1/20- lap appendectomy, lysis of adhesions, drain placed for abscess  1/27 CT guided pelvic abscess drain -15 ml out  Central access: 1/27 TPN start date: 1/27  Nutritional Goals: Goal TPN rate is 85 mL/hr (provides 112 g of protein and 2107 kcals per day)  RD Assessment: Estimated Needs Total Energy Estimated Needs:  2100-2300 Total Protein Estimated Needs: 105-125 grams Total Fluid Estimated Needs: >/= 2 L  Current Nutrition:  NPO and TPN  1/30 clear liquid diet   1/31 FLD - had cream of chicken soup, pudding, italian ice  2/1 regular diet - had 100% grits, juice and ice cream; working on [employer], which she likes. Passing gas, Stools starting to become formed  Plan:  Per surgery, stop TPN after current bag. Decrease to 18ml/hr at 1600 for 2 hours then stop at 1800. Spoke with RN.  Ensure TID  Give Multivitamin PO   Jinnie Door, PharmD, BCPS, [facility] Clinical Pharmacist  Please check AMION for all [facility] Pharmacy phone numbers After 10:00 PM, call Main Pharmacy [phone]

## 2024-04-05 NOTE — Progress Notes (Signed)
 12 Days Post-Op   Subjective/Chief Complaint: No complaints Tolerated full liquids Having bm's   Objective: Vital signs in last 24 hours: Temp:  [97.7 F (36.5 C)-98 F (36.7 C)] 97.7 F (36.5 C) (02/01 0813) Pulse Rate:  [80-88] 84 (02/01 0813) Resp:  [16-18] 16 (02/01 0813) BP: (114-149)/(64-73) 149/71 (02/01 0813) SpO2:  [97 %-98 %] 97 % (02/01 0813) Weight:  [84.4 kg] 84.4 kg (02/01 0500) Last BM Date : 04/03/24  Intake/Output from previous day: 01/31 0701 - 02/01 0700 In: 1926.8 [I.V.:1777.3; IV Piggyback:144.5] Out: 15 [Drains:15] Intake/Output this shift: No intake/output data recorded.  Exam: Awake and alert Comfortable Abdomen soft, NT/ND Drain seropurulent  Lab Results:  Recent Labs    04/05/24 0531  WBC 10.1  HGB 10.9*  HCT 34.0*  PLT 535*   BMET Recent Labs    04/03/24 0500 04/05/24 0531  NA 138 140  K 4.4 3.8  CL 102 104  CO2 27 28  GLUCOSE 151* 159*  BUN 17 15  CREATININE 0.73 0.70  CALCIUM 8.6* 8.7*   PT/INR No results for input(s): LABPROT, INR in the last 72 hours. ABG No results for input(s): PHART, HCO3 in the last 72 hours.  Invalid input(s): PCO2, PO2  Studies/Results: No results found.  Anti-infectives: Anti-infectives (From admission, onward)    Start     Dose/Rate Route Frequency Ordered Stop   03/27/24 1400  piperacillin -tazobactam (ZOSYN ) IVPB 3.375 g        3.375 g 12.5 mL/hr over 240 Minutes Intravenous Every 8 hours 03/27/24 0955     03/24/24 2200  piperacillin -tazobactam (ZOSYN ) IVPB 3.375 g  Status:  Discontinued        3.375 g 12.5 mL/hr over 240 Minutes Intravenous Every 8 hours 03/24/24 1833 03/27/24 0955   03/24/24 0830  piperacillin -tazobactam (ZOSYN ) IVPB 3.375 g        3.375 g 12.5 mL/hr over 240 Minutes Intravenous On call to O.R. 03/24/24 0823 03/24/24 0855   03/17/24 0200  piperacillin -tazobactam (ZOSYN ) IVPB 3.375 g        3.375 g 12.5 mL/hr over 240 Minutes Intravenous Every 8  hours 03/16/24 1928 03/24/24 0029   03/16/24 1730  piperacillin -tazobactam (ZOSYN ) IVPB 3.375 g        3.375 g 100 mL/hr over 30 Minutes Intravenous  Once 03/16/24 1719 03/16/24 1809       Assessment/Plan: POD 12 s/p dx lap with incidental salpingectomy and drainage of intra-abdominal abscess, Dr. Dasie for acute perforated appendiciti   LOS: 18 days   -advance to regular diet -wean TPN off -WBC now normal.  Will repeat in the morning and if still normal, will consider change to oral antibiotics -home soon +/- with the drain Andrea Pittman 04/05/2024

## 2024-04-06 ENCOUNTER — Inpatient Hospital Stay (HOSPITAL_COMMUNITY)

## 2024-04-06 LAB — CBC
HCT: 34 % — ABNORMAL LOW (ref 36.0–46.0)
Hemoglobin: 10.8 g/dL — ABNORMAL LOW (ref 12.0–15.0)
MCH: 29.3 pg (ref 26.0–34.0)
MCHC: 31.8 g/dL (ref 30.0–36.0)
MCV: 92.1 fL (ref 80.0–100.0)
Platelets: 509 10*3/uL — ABNORMAL HIGH (ref 150–400)
RBC: 3.69 MIL/uL — ABNORMAL LOW (ref 3.87–5.11)
RDW: 13.3 % (ref 11.5–15.5)
WBC: 11.2 10*3/uL — ABNORMAL HIGH (ref 4.0–10.5)
nRBC: 0 % (ref 0.0–0.2)

## 2024-04-06 MED ORDER — BISACODYL 5 MG PO TBEC
10.0000 mg | DELAYED_RELEASE_TABLET | Freq: Once | ORAL | Status: AC
  Administered 2024-04-06: 10 mg via ORAL
  Filled 2024-04-06: qty 2

## 2024-04-06 MED ORDER — MELATONIN 3 MG PO TABS
3.0000 mg | ORAL_TABLET | Freq: Every evening | ORAL | Status: DC | PRN
  Administered 2024-04-06: 3 mg via ORAL
  Filled 2024-04-06: qty 1

## 2024-04-06 MED ORDER — POLYETHYLENE GLYCOL 3350 17 G PO PACK
17.0000 g | PACK | Freq: Every day | ORAL | Status: DC
  Administered 2024-04-06 – 2024-04-07 (×2): 17 g via ORAL
  Filled 2024-04-06 (×2): qty 1

## 2024-04-06 MED ORDER — MAGNESIUM HYDROXIDE 400 MG/5ML PO SUSP
30.0000 mL | Freq: Once | ORAL | Status: AC
  Administered 2024-04-06: 30 mL via ORAL
  Filled 2024-04-06: qty 30

## 2024-04-06 MED ORDER — BISACODYL 10 MG RE SUPP
10.0000 mg | Freq: Once | RECTAL | Status: AC
  Administered 2024-04-06: 10 mg via RECTAL
  Filled 2024-04-06: qty 1

## 2024-04-06 MED ORDER — IOHEXOL 350 MG/ML SOLN
75.0000 mL | Freq: Once | INTRAVENOUS | Status: AC | PRN
  Administered 2024-04-06: 75 mL via INTRAVENOUS

## 2024-04-06 NOTE — Progress Notes (Signed)
" ° °  Trauma/Critical Care Follow Up Note  Subjective:    Overnight Issues:   Objective:  Vital signs for last 24 hours: Temp:  [97.6 F (36.4 C)-98.3 F (36.8 C)] 97.6 F (36.4 C) (02/02 0755) Pulse Rate:  [83-96] 83 (02/02 0755) Resp:  [15-20] 15 (02/02 0755) BP: (119-128)/(58-75) 119/75 (02/02 0755) SpO2:  [95 %-99 %] 95 % (02/02 0755)  Intake/Output from previous day: 02/01 0701 - 02/02 0700 In: -  Out: 20 [Drains:20]  Intake/Output this shift: Total I/O In: 240 [P.O.:240] Out: -   Vent settings for last 24 hours:    Physical Exam:  Gen: comfortable, no distress Neuro: follows commands, alert, communicative HEENT: PERRL Neck: supple CV: RRR Pulm: unlabored breathing on RA Abd: soft, NT, incision clean, dry, intact, JP purulent on L, serous on R/post, +BM GU: urine clear and yellow, +spontaneous voids Extr: wwp, no edema  Results for orders placed or performed during the hospital encounter of 03/16/24 (from the past 24 hours)  Glucose, capillary     Status: Abnormal   Collection Time: 04/05/24  3:54 PM  Result Value Ref Range   Glucose-Capillary 166 (H) 70 - 99 mg/dL  CBC     Status: Abnormal   Collection Time: 04/06/24  1:52 AM  Result Value Ref Range   WBC 11.2 (H) 4.0 - 10.5 K/uL   RBC 3.69 (L) 3.87 - 5.11 MIL/uL   Hemoglobin 10.8 (L) 12.0 - 15.0 g/dL   HCT 65.9 (L) 63.9 - 53.9 %   MCV 92.1 80.0 - 100.0 fL   MCH 29.3 26.0 - 34.0 pg   MCHC 31.8 30.0 - 36.0 g/dL   RDW 86.6 88.4 - 84.4 %   Platelets 509 (H) 150 - 400 K/uL   nRBC 0.0 0.0 - 0.2 %    Assessment & Plan:  LOS: 19 days   Additional comments:I reviewed the patient's new clinical lab test results.   and I reviewed the patients new imaging test results.    POD 13 s/p dx lap with incidental salpingectomy and drainage of intra-abdominal abscess, Dr. Dasie for acute perforated appendicitis   -advance to regular diet, has had small BMs -WBC normal yest, slightly up today. CT A/P today.   -home  soon with the surgical drain for sure, possibly may be a candidate for removal of the IR drain    Andrea GEANNIE Hanger, MD Trauma & General Surgery Please use [email or web address] to contact on call provider  04/06/2024  *Care during the described time interval was provided by me. I have reviewed this patient's available data, including medical history, events of note, physical examination and test results as part of my evaluation.    "

## 2024-04-06 NOTE — Plan of Care (Signed)
" °  Problem: Education: Goal: Knowledge of General Education information will improve Description: Including pain rating scale, medication(s)/side effects and non-pharmacologic comfort measures Outcome: Progressing   Problem: Health Behavior/Discharge Planning: Goal: Ability to manage health-related needs will improve Outcome: Progressing   Problem: Clinical Measurements: Goal: Ability to maintain clinical measurements within normal limits will improve Outcome: Progressing Goal: Will remain free from infection Outcome: Progressing Goal: Diagnostic test results will improve Outcome: Progressing Goal: Respiratory complications will improve Outcome: Progressing Goal: Cardiovascular complication will be avoided Outcome: Progressing   Problem: Activity: Goal: Risk for activity intolerance will decrease Outcome: Progressing   Problem: Nutrition: Goal: Adequate nutrition will be maintained Outcome: Progressing   Problem: Coping: Goal: Level of anxiety will decrease Outcome: Progressing   Problem: Elimination: Goal: Will not experience complications related to bowel motility Outcome: Progressing Goal: Will not experience complications related to urinary retention Outcome: Progressing   Problem: Pain Managment: Goal: General experience of comfort will improve and/or be controlled Outcome: Progressing   Problem: Safety: Goal: Ability to remain free from injury will improve Outcome: Progressing   Problem: Skin Integrity: Goal: Risk for impaired skin integrity will decrease Outcome: Progressing   Problem: Education: Goal: Ability to describe self-care measures that may prevent or decrease complications (Diabetes Survival Skills Education) will improve Outcome: Progressing Goal: Individualized Educational Video(s) Outcome: Progressing   Problem: Coping: Goal: Ability to adjust to condition or change in health will improve Outcome: Progressing   Problem: Fluid  Volume: Goal: Ability to maintain a balanced intake and output will improve Outcome: Progressing   Problem: Health Behavior/Discharge Planning: Goal: Ability to identify and utilize available resources and services will improve Outcome: Progressing Goal: Ability to manage health-related needs will improve Outcome: Progressing   Problem: Metabolic: Goal: Ability to maintain appropriate glucose levels will improve Outcome: Progressing   Problem: Nutritional: Goal: Maintenance of adequate nutrition will improve Outcome: Progressing Goal: Progress toward achieving an optimal weight will improve Outcome: Progressing   Problem: Skin Integrity: Goal: Risk for impaired skin integrity will decrease Outcome: Progressing   Problem: Tissue Perfusion: Goal: Adequacy of tissue perfusion will improve Outcome: Progressing   Problem: Education: Goal: Knowledge of the prescribed therapeutic regimen will improve Outcome: Progressing   Problem: Bowel/Gastric: Goal: Gastrointestinal status for postoperative course will improve Outcome: Progressing   Problem: Cardiac: Goal: Ability to maintain an adequate cardiac output Outcome: Progressing Goal: Will show no evidence of cardiac arrhythmias Outcome: Progressing   Problem: Nutritional: Goal: Will attain and maintain optimal nutritional status Outcome: Progressing   Problem: Neurological: Goal: Will regain or maintain usual level of consciousness Outcome: Progressing   Problem: Clinical Measurements: Goal: Ability to maintain clinical measurements within normal limits Outcome: Progressing Goal: Postoperative complications will be avoided or minimized Outcome: Progressing   Problem: Respiratory: Goal: Will regain and/or maintain adequate ventilation Outcome: Progressing Goal: Respiratory status will improve Outcome: Progressing   Problem: Skin Integrity: Goal: Demonstrates signs of wound healing without infection Outcome:  Progressing   Problem: Urinary Elimination: Goal: Will remain free from infection Outcome: Progressing Goal: Ability to achieve and maintain adequate urine output Outcome: Progressing   "

## 2024-04-07 ENCOUNTER — Inpatient Hospital Stay (HOSPITAL_COMMUNITY)

## 2024-04-07 ENCOUNTER — Other Ambulatory Visit (HOSPITAL_COMMUNITY): Payer: Self-pay

## 2024-04-07 MED ORDER — AMOXICILLIN-POT CLAVULANATE 875-125 MG PO TABS
1.0000 | ORAL_TABLET | Freq: Two times a day (BID) | ORAL | 0 refills | Status: DC
  Filled 2024-04-07: qty 20, 10d supply, fill #0

## 2024-04-07 MED ORDER — METRONIDAZOLE 500 MG PO TABS
500.0000 mg | ORAL_TABLET | Freq: Two times a day (BID) | ORAL | 0 refills | Status: AC
  Filled 2024-04-07: qty 20, 10d supply, fill #0

## 2024-04-07 MED ORDER — IOHEXOL 300 MG/ML  SOLN
50.0000 mL | Freq: Once | INTRAMUSCULAR | Status: AC | PRN
  Administered 2024-04-07: 10 mL

## 2024-04-07 MED ORDER — CIPROFLOXACIN HCL 500 MG PO TABS
500.0000 mg | ORAL_TABLET | Freq: Two times a day (BID) | ORAL | 0 refills | Status: AC
  Filled 2024-04-07: qty 20, 10d supply, fill #0

## 2024-04-07 MED ORDER — METHOCARBAMOL 500 MG PO TABS
500.0000 mg | ORAL_TABLET | Freq: Three times a day (TID) | ORAL | 0 refills | Status: AC | PRN
  Filled 2024-04-07: qty 40, 14d supply, fill #0

## 2024-04-07 NOTE — Progress Notes (Signed)
" ° °  General Surgery Follow Up Note  Subjective:    Overnight Issues:   Objective:  Vital signs for last 24 hours: Temp:  [97.8 F (36.6 C)-98.7 F (37.1 C)] 98.5 F (36.9 C) (02/03 0826) Pulse Rate:  [76-88] 87 (02/03 0826) Resp:  [16-19] 18 (02/03 0552) BP: (114-149)/(63-76) 146/76 (02/03 0826) SpO2:  [96 %-100 %] 96 % (02/03 0826)  Hemodynamic parameters for last 24 hours:    Intake/Output from previous day: 02/02 0701 - 02/03 0700 In: 240 [P.O.:240] Out: 14 [Drains:14]  Intake/Output this shift: No intake/output data recorded.  Vent settings for last 24 hours:    Physical Exam:  Gen: comfortable, no distress Neuro: follows commands, alert, communicative HEENT: PERRL Neck: supple CV: RRR Pulm: unlabored breathing on RA Abd: soft, NT, incision clean, dry, intact, JP serous on R, purulent on L GU: urine clear and yellow, +spontaneous void Extr: wwp, no edema  No results found for this or any previous visit (from the past 24 hours).  Assessment & Plan:  Present on Admission:  Perforated appendicitis    LOS: 20 days   Additional comments:I reviewed the patient's new clinical lab test results.   and I reviewed the patients new imaging test results.    POD 14 s/p dx lap with incidental salpingectomy and drainage of intra-abdominal abscess, Dr. Dasie for acute perforated appendicitis   -advance to regular diet, +BM -CT A/P yest, decreased size of fluid collections. Will d/w IR re: removal of IR drain   -home today with the surgical drain +/- IR drain and abx  Andrea GEANNIE Hanger, MD Trauma & General Surgery Please use [email or web address] to contact on call provider  04/07/2024  *Care during the described time interval was provided by me. I have reviewed this patient's available data, including medical history, events of note, physical examination and test results as part of my evaluation.  "

## 2024-04-07 NOTE — Plan of Care (Signed)
" °  Problem: Education: Goal: Knowledge of General Education information will improve Description: Including pain rating scale, medication(s)/side effects and non-pharmacologic comfort measures Outcome: Progressing   Problem: Health Behavior/Discharge Planning: Goal: Ability to manage health-related needs will improve Outcome: Progressing   Problem: Clinical Measurements: Goal: Ability to maintain clinical measurements within normal limits will improve Outcome: Progressing Goal: Will remain free from infection Outcome: Progressing Goal: Diagnostic test results will improve Outcome: Progressing Goal: Respiratory complications will improve Outcome: Progressing Goal: Cardiovascular complication will be avoided Outcome: Progressing   Problem: Activity: Goal: Risk for activity intolerance will decrease Outcome: Progressing   Problem: Nutrition: Goal: Adequate nutrition will be maintained Outcome: Progressing   Problem: Coping: Goal: Level of anxiety will decrease Outcome: Progressing   Problem: Elimination: Goal: Will not experience complications related to bowel motility Outcome: Progressing Goal: Will not experience complications related to urinary retention Outcome: Progressing   Problem: Pain Managment: Goal: General experience of comfort will improve and/or be controlled Outcome: Progressing   Problem: Safety: Goal: Ability to remain free from injury will improve Outcome: Progressing   Problem: Skin Integrity: Goal: Risk for impaired skin integrity will decrease Outcome: Progressing   Problem: Education: Goal: Ability to describe self-care measures that may prevent or decrease complications (Diabetes Survival Skills Education) will improve Outcome: Progressing Goal: Individualized Educational Video(s) Outcome: Progressing   Problem: Coping: Goal: Ability to adjust to condition or change in health will improve Outcome: Progressing   Problem: Fluid  Volume: Goal: Ability to maintain a balanced intake and output will improve Outcome: Progressing   Problem: Health Behavior/Discharge Planning: Goal: Ability to identify and utilize available resources and services will improve Outcome: Progressing Goal: Ability to manage health-related needs will improve Outcome: Progressing   Problem: Metabolic: Goal: Ability to maintain appropriate glucose levels will improve Outcome: Progressing   Problem: Nutritional: Goal: Maintenance of adequate nutrition will improve Outcome: Progressing Goal: Progress toward achieving an optimal weight will improve Outcome: Progressing   Problem: Skin Integrity: Goal: Risk for impaired skin integrity will decrease Outcome: Progressing   Problem: Tissue Perfusion: Goal: Adequacy of tissue perfusion will improve Outcome: Progressing   Problem: Education: Goal: Knowledge of the prescribed therapeutic regimen will improve Outcome: Progressing   Problem: Bowel/Gastric: Goal: Gastrointestinal status for postoperative course will improve Outcome: Progressing   Problem: Cardiac: Goal: Ability to maintain an adequate cardiac output Outcome: Progressing Goal: Will show no evidence of cardiac arrhythmias Outcome: Progressing   Problem: Nutritional: Goal: Will attain and maintain optimal nutritional status Outcome: Progressing   Problem: Neurological: Goal: Will regain or maintain usual level of consciousness Outcome: Progressing   Problem: Clinical Measurements: Goal: Ability to maintain clinical measurements within normal limits Outcome: Progressing Goal: Postoperative complications will be avoided or minimized Outcome: Progressing   Problem: Respiratory: Goal: Will regain and/or maintain adequate ventilation Outcome: Progressing Goal: Respiratory status will improve Outcome: Progressing   Problem: Skin Integrity: Goal: Demonstrates signs of wound healing without infection Outcome:  Progressing   Problem: Urinary Elimination: Goal: Will remain free from infection Outcome: Progressing Goal: Ability to achieve and maintain adequate urine output Outcome: Progressing   "

## 2024-04-08 ENCOUNTER — Telehealth: Payer: Self-pay

## 2024-04-08 NOTE — Transitions of Care (Post Inpatient/ED Visit) (Signed)
 "  04/08/2024  Name: Andrea Pittman MRN: [id number] DOB: 1955/10/02  Today's TOC FU Call Status: Today's TOC FU Call Status:: Successful TOC FU Call Completed TOC FU Call Complete Date: 04/08/24  Patient's Name and Date of Birth confirmed. Name, DOB  Transition Care Management Follow-up Telephone Call Date of Discharge: 04/07/24 Discharge Facility: [facility]) Type of Discharge: Inpatient Admission Primary Inpatient Discharge Diagnosis:: Perforated appendicitis How have you been since you were released from the hospital?: Better Any questions or concerns?: No  Items Reviewed: Did you receive and understand the discharge instructions provided?: Yes Medications obtained,verified, and reconciled?: Yes (Medications Reviewed) Any new allergies since your discharge?: No Dietary orders reviewed?: Yes Type of Diet Ordered:: Soft diet progress, patient is diabetic Do you have support at home?: Yes People in Home [RPT]: spouse Name of Support/Comfort Primary Source: Christopher, spouse  Medications Reviewed Today: Medications Reviewed Today     Reviewed by Eilleen Richerd GRADE, RN (Registered Nurse) on 04/08/24 at 0932  Med List Status: <None>   Medication Order Taking? Sig Documenting Provider Last Dose Status Informant  acetaminophen  (TYLENOL ) 500 MG tablet [id number]  Take 2 tablets (1,000 mg total) by mouth every 6 (six) hours as needed.  Patient taking differently: Take 500 mg by mouth every 6 (six) hours as needed for mild pain (pain score 1-3).   Tammy Sor, PA-C  Active Self, Pharmacy Records  Cholecalciferol (VITAMIN D) 50 MCG (2000 UT) CAPS [id number] Yes Take by mouth. [provider]  Active Self, Pharmacy Records  ciprofloxacin  (CIPRO ) 500 MG tablet [id number] Yes Take 1 tablet (500 mg total) by mouth 2 (two) times daily for 10 days. Edmundo Marjorie Lapine, PA-C  Active   Cyanocobalamin (B-12) 100 MCG TABS [id number] Yes Take 1 tablet by mouth daily. [provider]   Active Self, Pharmacy Records  DULoxetine  (CYMBALTA ) 60 MG capsule [id number]  Take 2 capsules (120 mg total) by mouth daily.  Patient taking differently: Take 60 mg by mouth daily.   Dettinger, Fonda LABOR, MD  Active Self, Pharmacy Records           Med Note Drumright, RICHERD GRADE [place] Apr 08, 2024  9:31 AM) Taking  estradiol  (ESTRACE ) 0.5 MG tablet [id number] Yes Take 0.5 mg by mouth daily. [provider]  Active Self, Pharmacy Records  ibuprofen (ADVIL) 200 MG tablet [id number] Yes Take 400-600 mg by mouth every 6 (six) hours as needed for fever, headache or mild pain. [provider]  Active Self, Pharmacy Records  methocarbamol  (ROBAXIN ) 500 MG tablet [id number] Yes Take 1 tablet (500 mg total) by mouth every 8 (eight) hours as needed for muscle spasms. Tammy Sor, PA-C  Active   metroNIDAZOLE  (FLAGYL ) 500 MG tablet [id number] Yes Take 1 tablet (500 mg total) by mouth 2 (two) times daily for 10 days. Edmundo Marjorie Lapine, PA-C  Active   omega-3 acid ethyl esters (LOVAZA) 1 g capsule [id number] Yes Take 1 g by mouth daily. [provider]  Active Self, Pharmacy Records  omeprazole  (PRILOSEC) 20 MG capsule [id number] Yes Take 1 capsule (20 mg total) by mouth daily. Dettinger, Fonda LABOR, MD  Active Self, Pharmacy Records  pregabalin  (LYRICA ) 100 MG capsule [id number] Yes Take 1 capsule (100 mg total) by mouth 2 (two) times daily. Patel, Donika K, DO  Active Self, Pharmacy Records  Semaglutide ,0.25 or 0.5MG /DOS, 2 MG/3ML SOPN [id number] Yes Inject 0.25 mg into the skin once a week. Dettinger, Fonda LABOR, MD  Active Self,  Pharmacy Records           Med Note LESLY, RICHERD CINDERELLA [place] Apr 08, 2024  9:30 AM) Sundays            Home Care and Equipment/Supplies: Were Home Health Services Ordered?: NA Any new equipment or medical supplies ordered?: NA  Functional Questionnaire: Do you need assistance with bathing/showering or dressing?: No Do you need assistance with meal  preparation?: Yes Do you need assistance with eating?: No Do you have difficulty maintaining continence: No (wear depends for just in case) Do you need assistance with getting out of bed/getting out of a chair/moving?: No Do you have difficulty managing or taking your medications?: No  Follow up appointments reviewed: PCP Follow-up appointment confirmed?: Yes Date of PCP follow-up appointment?: 04/27/24 (Reminded patient that this is over 14 day but she prefers to keep this as her follow up appointment with PCP) Follow-up Provider: Fonda Levins, MD Specialist Hospital Follow-up appointment confirmed?: Yes Date of Specialist follow-up appointment?: 04/21/24 Follow-Up Specialty Provider:: Leonor Dawn, MD General Surgery, [facility] Do you need transportation to your follow-up appointment?: No Do you understand care options if your condition(s) worsen?: Yes-patient verbalized understanding  SDOH Interventions Today    Flowsheet Row Most Recent Value  SDOH Interventions   Food Insecurity Interventions Intervention Not Indicated  Housing Interventions Intervention Not Indicated  Transportation Interventions Intervention Not Indicated  Utilities Interventions Intervention Not Indicated    Goals Addressed             This Visit's Progress    VBCI Transitions of Care (TOC) Care Plan       Problems:  Recent Hospitalization for treatment of s/p perforated appendicitis post op No Hospital Follow Up Provider appointment patient encouraged to call and she has an appointment for routine follow up 04/27/24 that she desires to just keep currently.  Goal:  Over the next 30 days, the patient will not experience hospital readmission  Interventions:  Transitions of Care: Doctor Visits  - discussed the importance of doctor visits Communication with PCP - Dr. Levins re: medications managed by him and regarding diabetes management Patient has a surgical follow up 05/01/24 at 10:50 am discussed  with General surgeon , Dr. Leonor Dawn showing in EMR and patient to review in MyChart verified as active user.  Patient Self Care Activities:  Attend all scheduled provider appointments Call pharmacy for medication refills 3-7 days in advance of running out of medications Call provider office for new concerns or questions  Notify RN Care Manager of TOC call rescheduling needs Participate in Transition of Care Program/Attend TOC scheduled calls Take medications as prescribed    Plan:  The patient has been provided with contact information for the care management team and has been advised to call with any health related questions or concerns.  04/08/24 Discussed and offered 30 day TOC program.  Patient agrees to weekly follow up calls.  The patient has been provided with contact information for the care management team and has been advised to call with any health -related questions or concerns.  The patient verbalized understanding with current plan of care.  The patient is directed to their insurance card regarding availability of benefits coverage.          Richerd Fish, RN, BSN, CCM [facility], [facility] RN Care Manager Direct Dial: [phone]        "

## 2024-04-08 NOTE — Patient Instructions (Signed)
 Visit Information  Thank you for taking time to visit with me today. Please don't hesitate to contact me if I can be of assistance to you before our next scheduled telephone appointment.  Our next appointment is by telephone on 04/15/24 at 10:00 AM  Following is a copy of your care plan:   Goals Addressed             This Visit's Progress    VBCI Transitions of Care (TOC) Care Plan       Problems:  Recent Hospitalization for treatment of s/p perforated appendicitis post op No Hospital Follow Up Provider appointment patient encouraged to call and she has an appointment for routine follow up 04/27/24 that she desires to just keep currently.  Goal:  Over the next 30 days, the patient will not experience hospital readmission  Interventions:  Transitions of Care: Doctor Visits  - discussed the importance of doctor visits Communication with PCP - Dr. Maryanne re: medications managed by him and regarding diabetes management Patient has a surgical follow up 05/01/24 at 10:50 am discussed with General surgeon , Dr. Leonor Dawn showing in EMR and patient to review in MyChart verified as active user.  Patient Self Care Activities:  Attend all scheduled provider appointments Call pharmacy for medication refills 3-7 days in advance of running out of medications Call provider office for new concerns or questions  Notify RN Care Manager of TOC call rescheduling needs Participate in Transition of Care Program/Attend TOC scheduled calls Take medications as prescribed    Plan:  The patient has been provided with contact information for the care management team and has been advised to call with any health related questions or concerns.  04/08/24 Discussed and offered 30 day TOC program.  Patient agrees to weekly follow up calls.  The patient has been provided with contact information for the care management team and has been advised to call with any health -related questions or concerns.  The patient  verbalized understanding with current plan of care.  The patient is directed to their insurance card regarding availability of benefits coverage.          Patient verbalizes understanding of instructions and care plan provided today and agrees to view in MyChart. Active MyChart status and patient understanding of how to access instructions and care plan via MyChart confirmed with patient.     The patient has been provided with contact information for the care management team and has been advised to call with any health related questions or concerns.   Please call the care guide team at [phone] if you need to cancel or reschedule your appointment.   Please call the [place]  National Suicide Prevention Lifeline: 220-674-7679 or TTY: 312 007 3793 TTY 450-317-1998) to talk to a trained counselor call 1-800-273-TALK (toll free, 24 hour hotline) call 911 if you are experiencing a Mental Health or Behavioral Health Crisis or need someone to talk to.  Richerd Fish, RN, BSN, CCM [facility], [facility] RN Care Manager Direct Dial: [phone]

## 2024-04-10 NOTE — Discharge Summary (Signed)
 [facility] Discharge Summary   Patient ID: Andrea Pittman MRN: [id number] DOB/AGE: Jun 19, 1955 69 y.o.  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) 11/02/2021   Depression, recurrent 09/20/2020   Obesity (BMI 30.0-34.9) 07/04/2017   Hereditary and idiopathic peripheral neuropathy 02/15/2015   GERD (gastroesophageal reflux disease) 02/15/2015   Consultants General surgery Interventional radiology  Imaging: CT Abd Pelv w contrast 03-16-2024 CT Abd Pelv w contrast 03-19-2024 DG Abd Portable 1 V 03-26-2024 DG Abd Portable 1 V 03-26-2024 CT Abd Pelv w contrast 03-30-2024 US  EKG Site Rite 03-30-2024 DG Abd Portable 1V 03-30-2024 CT Guided Peritoneal/Retroperitoneal fluid drain by perc cath 03-31-2024 CT Abd Pelv w contrast 04-06-2024 IR Sinus/Fist Tube Chk-Non GI 04-07-2024  Procedures Dr. Leonor Dawn, MD (03/24/2024) - Laparoscopic appendectomy, she had an intra-abdominal abscess, drain placement  Dr. Ozell Specking, MD (03/31/2024) - CT RT Transgluteal pelvic abscess drain placement  Hospital Course:  Andrea Pittman is a 69 year old female who presented to the ED with abdominal pain. Workup showed perforated appendicitis.  Patient was admitted and initially was treated nonoperatively with IV antibiotics. She later underwent procedure listed above. A JP drain was placed at the time of surgery. Patient also underwent CT RT transgluteal pelvic abscess drain placement by IR. Tolerated procedures well and was transferred to the floor. Diet was advanced as tolerated. On POD14 from laparoscopic appendectomy, the patient was voiding well, tolerating  diet, ambulating well, pain well controlled, vital signs stable, incisions c/d/i and felt stable for discharge home. JP drain in place at time of discharge. Patient received course of antibiotics at discharge. Patient will follow up in our office and knows to call with questions or concerns. She will call to confirm appointment date/time.    Physical Exam: Physical exam was completed by Dr. Paola prior to discharge. Gen: comfortable, no distress Neuro: follows commands, alert, communicative HEENT: PERRL Neck: supple CV: RRR Pulm: unlabored breathing on RA Abd: soft, NT, incision clean, dry, intact, JP serous on R, purulent on L GU: urine clear and yellow, +spontaneous void Extr: wwp, no edema  I or a member of my team have reviewed this patient in the Controlled Substance Database.  I was not directly involved in this patient's care at discharge and did not see the patient on the day of their discharge, therefore the information in this discharge summary was taken entirely from the chart.  This is a brief summary of patient's hospitalization. Please refer to patient's chart for complete details.   Allergies as of 04/07/2024   No Known Allergies      Medication List     STOP taking these medications    ondansetron  8 MG disintegrating tablet Commonly known as: ZOFRAN -ODT       TAKE these medications    acetaminophen  500 MG tablet Commonly known as: TYLENOL  Take 2 tablets (1,000 mg total) by mouth every 6 (six) hours as needed. What changed:  how much to take reasons to take this   B-12 100 MCG Tabs Take 1 tablet by mouth daily.   ciprofloxacin  500 MG tablet Commonly known as: Cipro  Take 1 tablet (500 mg total) by mouth 2 (two) times daily for 10 days.  DULoxetine  60 MG capsule Commonly known as: CYMBALTA  Take 2 capsules (120 mg total) by mouth daily. What changed: how much to take   estradiol  0.5 MG tablet Commonly known as: ESTRACE  Take 0.5 mg by mouth  daily. What changed: Another medication with the same name was removed. Continue taking this medication, and follow the directions you see here.   ibuprofen 200 MG tablet Commonly known as: ADVIL Take 400-600 mg by mouth every 6 (six) hours as needed for fever, headache or mild pain.   methocarbamol  500 MG tablet Commonly known as: ROBAXIN  Take 1 tablet (500 mg total) by mouth every 8 (eight) hours as needed for muscle spasms.   metroNIDAZOLE  500 MG tablet Commonly known as: FLAGYL  Take 1 tablet (500 mg total) by mouth 2 (two) times daily for 10 days.   omega-3 acid ethyl esters 1 g capsule Commonly known as: LOVAZA Take 1 g by mouth daily.   omeprazole  20 MG capsule Commonly known as: PRILOSEC Take 1 capsule (20 mg total) by mouth daily.   pregabalin  100 MG capsule Commonly known as: LYRICA  Take 1 capsule (100 mg total) by mouth 2 (two) times daily.   Semaglutide (0.25 or 0.5MG /DOS) 2 MG/3ML Sopn Inject 0.25 mg into the skin once a week.   Vitamin D 50 MCG (2000 UT) Caps Take by mouth.          Follow-up Information     Dettinger, Fonda LABOR, MD Follow up.   Specialties: Family Medicine, Cardiology Contact information: [address] [place] [address] [phone]         [name], MD Follow up on 04/21/2024.   Specialty: General Surgery Why: 10:50am, Arrive 30 minutes prior to your appointment time, Please bring your insurance card and photo ID Contact information: [address] [place] [address] [place] [address] [phone]                 Signed: Marjorie Carlyon Edmundo DEVONNA [facility] 04/10/2024, 1:56 PM Please see Amion for pager number during day hours 7:00am-4:30pm

## 2024-04-15 ENCOUNTER — Telehealth

## 2024-04-27 ENCOUNTER — Ambulatory Visit: Admitting: Family Medicine

## 2024-09-14 ENCOUNTER — Ambulatory Visit: Admitting: Neurology

## 2024-09-16 ENCOUNTER — Ambulatory Visit: Payer: Self-pay
